# Patient Record
Sex: Female | Born: 1970
Health system: Southern US, Community
[De-identification: ages and names within clinical notes are randomized; demographics above are authoritative.]

## PROBLEM LIST (undated history)

## (undated) ENCOUNTER — Ambulatory Visit: Admission: EM | Payer: PRIVATE HEALTH INSURANCE | Source: Home / Self Care

## (undated) DIAGNOSIS — I2510 Atherosclerotic heart disease of native coronary artery without angina pectoris: Secondary | ICD-10-CM

## (undated) DIAGNOSIS — D219 Benign neoplasm of connective and other soft tissue, unspecified: Secondary | ICD-10-CM

## (undated) DIAGNOSIS — Z8489 Family history of other specified conditions: Secondary | ICD-10-CM

## (undated) DIAGNOSIS — R87619 Unspecified abnormal cytological findings in specimens from cervix uteri: Secondary | ICD-10-CM

## (undated) DIAGNOSIS — O139 Gestational [pregnancy-induced] hypertension without significant proteinuria, unspecified trimester: Secondary | ICD-10-CM

## (undated) DIAGNOSIS — I10 Essential (primary) hypertension: Secondary | ICD-10-CM

## (undated) DIAGNOSIS — IMO0002 Reserved for concepts with insufficient information to code with codable children: Secondary | ICD-10-CM

## (undated) HISTORY — DX: Atherosclerotic heart disease of native coronary artery without angina pectoris: I25.10

## (undated) HISTORY — DX: Essential (primary) hypertension: I10

## (undated) HISTORY — PX: COLPOSCOPY: SHX161

## (undated) HISTORY — DX: Gestational (pregnancy-induced) hypertension without significant proteinuria, unspecified trimester: O13.9

## (undated) HISTORY — PX: BACK SURGERY: SHX140

---

## 1993-12-23 HISTORY — PX: CHOLECYSTECTOMY: SHX55

## 1996-12-23 HISTORY — PX: GYNECOLOGIC CRYOSURGERY: SHX857

## 2000-02-26 ENCOUNTER — Other Ambulatory Visit: Admission: RE | Admit: 2000-02-26 | Discharge: 2000-02-26 | Payer: Self-pay | Admitting: *Deleted

## 2000-03-24 ENCOUNTER — Encounter: Payer: Self-pay | Admitting: Emergency Medicine

## 2000-03-24 ENCOUNTER — Emergency Department (HOSPITAL_COMMUNITY): Admission: EM | Admit: 2000-03-24 | Discharge: 2000-03-24 | Payer: Self-pay | Admitting: *Deleted

## 2000-09-09 ENCOUNTER — Encounter: Payer: Self-pay | Admitting: Emergency Medicine

## 2000-09-09 ENCOUNTER — Emergency Department (HOSPITAL_COMMUNITY): Admission: EM | Admit: 2000-09-09 | Discharge: 2000-09-09 | Payer: Self-pay | Admitting: Emergency Medicine

## 2001-06-15 ENCOUNTER — Emergency Department (HOSPITAL_COMMUNITY): Admission: EM | Admit: 2001-06-15 | Discharge: 2001-06-15 | Payer: Self-pay | Admitting: Emergency Medicine

## 2001-06-21 ENCOUNTER — Inpatient Hospital Stay (HOSPITAL_COMMUNITY): Admission: AD | Admit: 2001-06-21 | Discharge: 2001-06-21 | Payer: Self-pay | Admitting: Obstetrics and Gynecology

## 2001-06-23 ENCOUNTER — Encounter (INDEPENDENT_AMBULATORY_CARE_PROVIDER_SITE_OTHER): Payer: Self-pay | Admitting: Specialist

## 2001-06-23 ENCOUNTER — Encounter: Payer: Self-pay | Admitting: *Deleted

## 2001-06-23 ENCOUNTER — Inpatient Hospital Stay (HOSPITAL_COMMUNITY): Admission: AD | Admit: 2001-06-23 | Discharge: 2001-06-23 | Payer: Self-pay | Admitting: *Deleted

## 2002-09-10 ENCOUNTER — Other Ambulatory Visit: Admission: RE | Admit: 2002-09-10 | Discharge: 2002-09-10 | Payer: Self-pay | Admitting: Obstetrics and Gynecology

## 2002-11-30 ENCOUNTER — Encounter: Admission: RE | Admit: 2002-11-30 | Discharge: 2003-02-28 | Payer: Self-pay | Admitting: Internal Medicine

## 2002-12-09 ENCOUNTER — Ambulatory Visit (HOSPITAL_COMMUNITY): Admission: RE | Admit: 2002-12-09 | Discharge: 2002-12-09 | Payer: Self-pay | Admitting: Obstetrics and Gynecology

## 2002-12-09 ENCOUNTER — Encounter (INDEPENDENT_AMBULATORY_CARE_PROVIDER_SITE_OTHER): Payer: Self-pay | Admitting: Specialist

## 2003-04-17 ENCOUNTER — Emergency Department (HOSPITAL_COMMUNITY): Admission: EM | Admit: 2003-04-17 | Discharge: 2003-04-18 | Payer: Self-pay | Admitting: Emergency Medicine

## 2003-10-26 ENCOUNTER — Other Ambulatory Visit: Admission: RE | Admit: 2003-10-26 | Discharge: 2003-10-26 | Payer: Self-pay | Admitting: Obstetrics and Gynecology

## 2004-03-22 ENCOUNTER — Emergency Department (HOSPITAL_COMMUNITY): Admission: EM | Admit: 2004-03-22 | Discharge: 2004-03-22 | Payer: Self-pay | Admitting: Emergency Medicine

## 2004-10-26 ENCOUNTER — Ambulatory Visit: Payer: Self-pay | Admitting: Internal Medicine

## 2005-01-09 ENCOUNTER — Ambulatory Visit: Payer: Self-pay | Admitting: Internal Medicine

## 2005-02-12 ENCOUNTER — Other Ambulatory Visit: Admission: RE | Admit: 2005-02-12 | Discharge: 2005-02-12 | Payer: Self-pay | Admitting: Obstetrics and Gynecology

## 2005-03-15 ENCOUNTER — Ambulatory Visit: Payer: Self-pay | Admitting: Internal Medicine

## 2005-03-26 ENCOUNTER — Encounter: Admission: RE | Admit: 2005-03-26 | Discharge: 2005-06-24 | Payer: Self-pay | Admitting: Internal Medicine

## 2005-03-27 ENCOUNTER — Ambulatory Visit: Payer: Self-pay | Admitting: Internal Medicine

## 2005-03-29 ENCOUNTER — Ambulatory Visit (HOSPITAL_COMMUNITY): Admission: RE | Admit: 2005-03-29 | Discharge: 2005-03-29 | Payer: Self-pay | Admitting: Obstetrics & Gynecology

## 2005-04-04 ENCOUNTER — Ambulatory Visit (HOSPITAL_COMMUNITY): Admission: RE | Admit: 2005-04-04 | Discharge: 2005-04-04 | Payer: Self-pay | Admitting: Obstetrics & Gynecology

## 2005-04-05 ENCOUNTER — Encounter (INDEPENDENT_AMBULATORY_CARE_PROVIDER_SITE_OTHER): Payer: Self-pay | Admitting: *Deleted

## 2005-04-05 ENCOUNTER — Ambulatory Visit (HOSPITAL_COMMUNITY): Admission: RE | Admit: 2005-04-05 | Discharge: 2005-04-05 | Payer: Self-pay | Admitting: Obstetrics & Gynecology

## 2005-11-18 ENCOUNTER — Emergency Department (HOSPITAL_COMMUNITY): Admission: EM | Admit: 2005-11-18 | Discharge: 2005-11-18 | Payer: Self-pay | Admitting: Emergency Medicine

## 2006-03-04 ENCOUNTER — Emergency Department (HOSPITAL_COMMUNITY): Admission: EM | Admit: 2006-03-04 | Discharge: 2006-03-04 | Payer: Self-pay | Admitting: Emergency Medicine

## 2006-03-17 ENCOUNTER — Emergency Department (HOSPITAL_COMMUNITY): Admission: EM | Admit: 2006-03-17 | Discharge: 2006-03-17 | Payer: Self-pay | Admitting: Family Medicine

## 2006-04-15 IMAGING — CR DG ANKLE COMPLETE 3+V*R*
3 series · 3 of 3 positions shown · non-contrast
Comparison: None.

CLINICAL DATA: Ankle injury with lateral ankle pain. 
 RIGHT ANKLE - 3 VIEW:

[t ankle joint ap right]
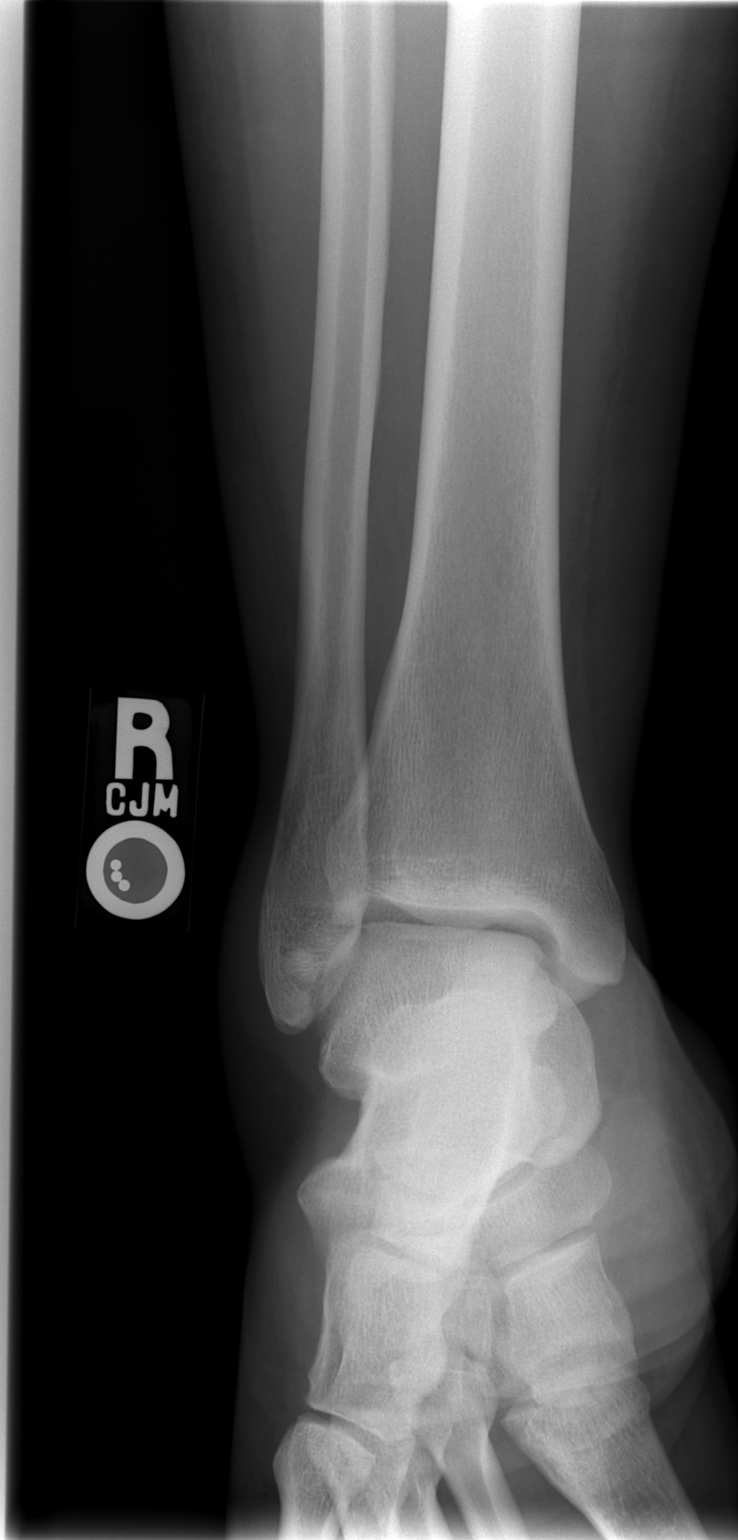

[t ankle joint oblique right]
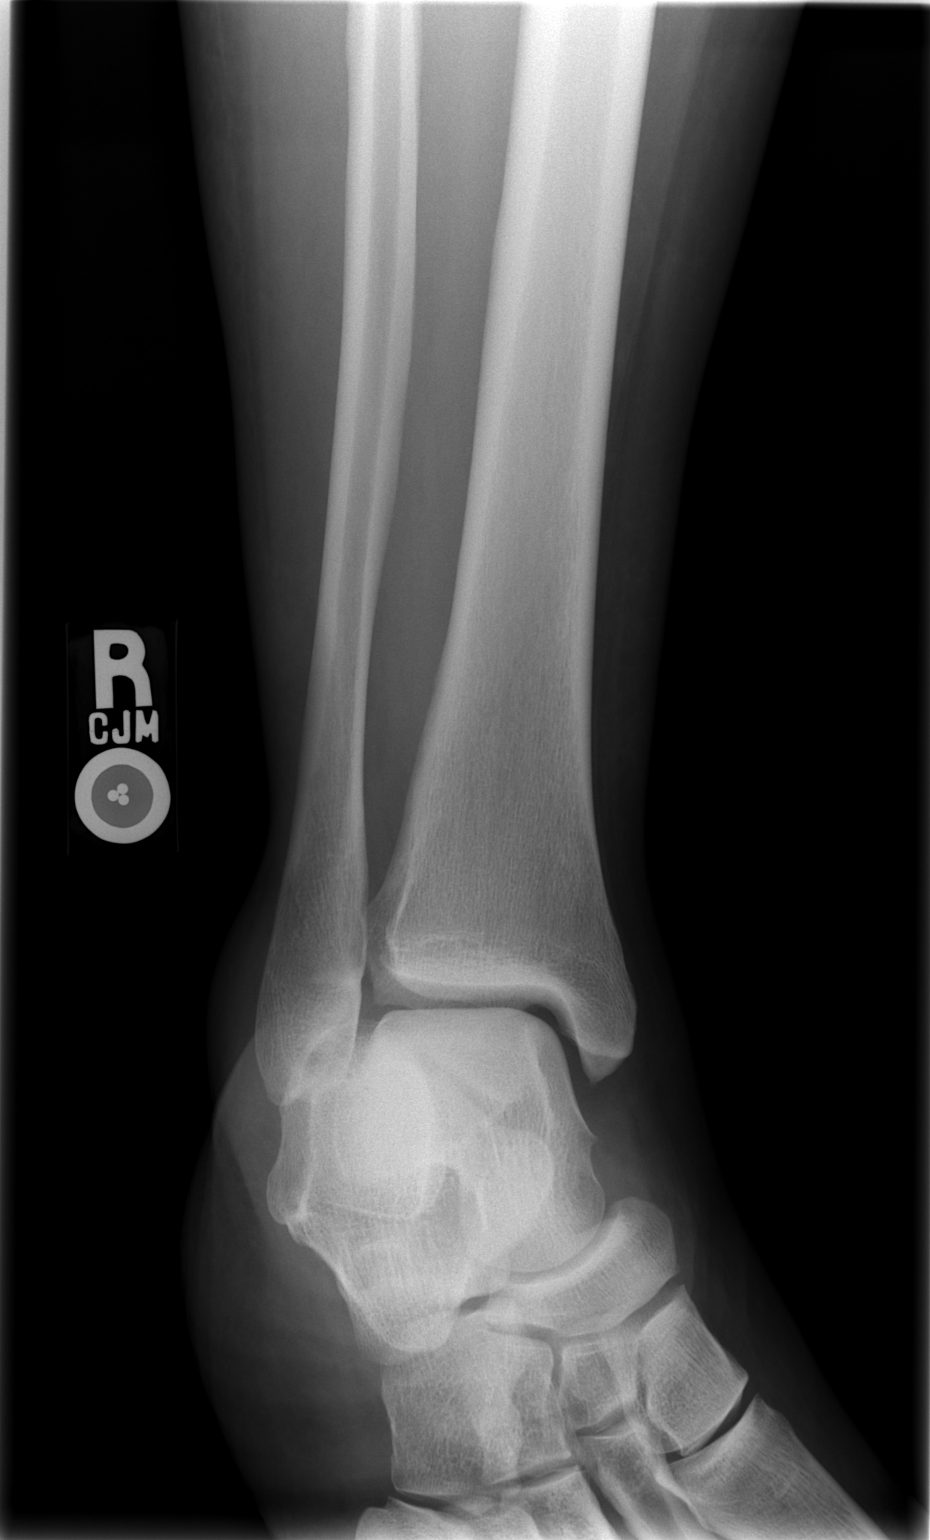

[t ankle joint lat right]
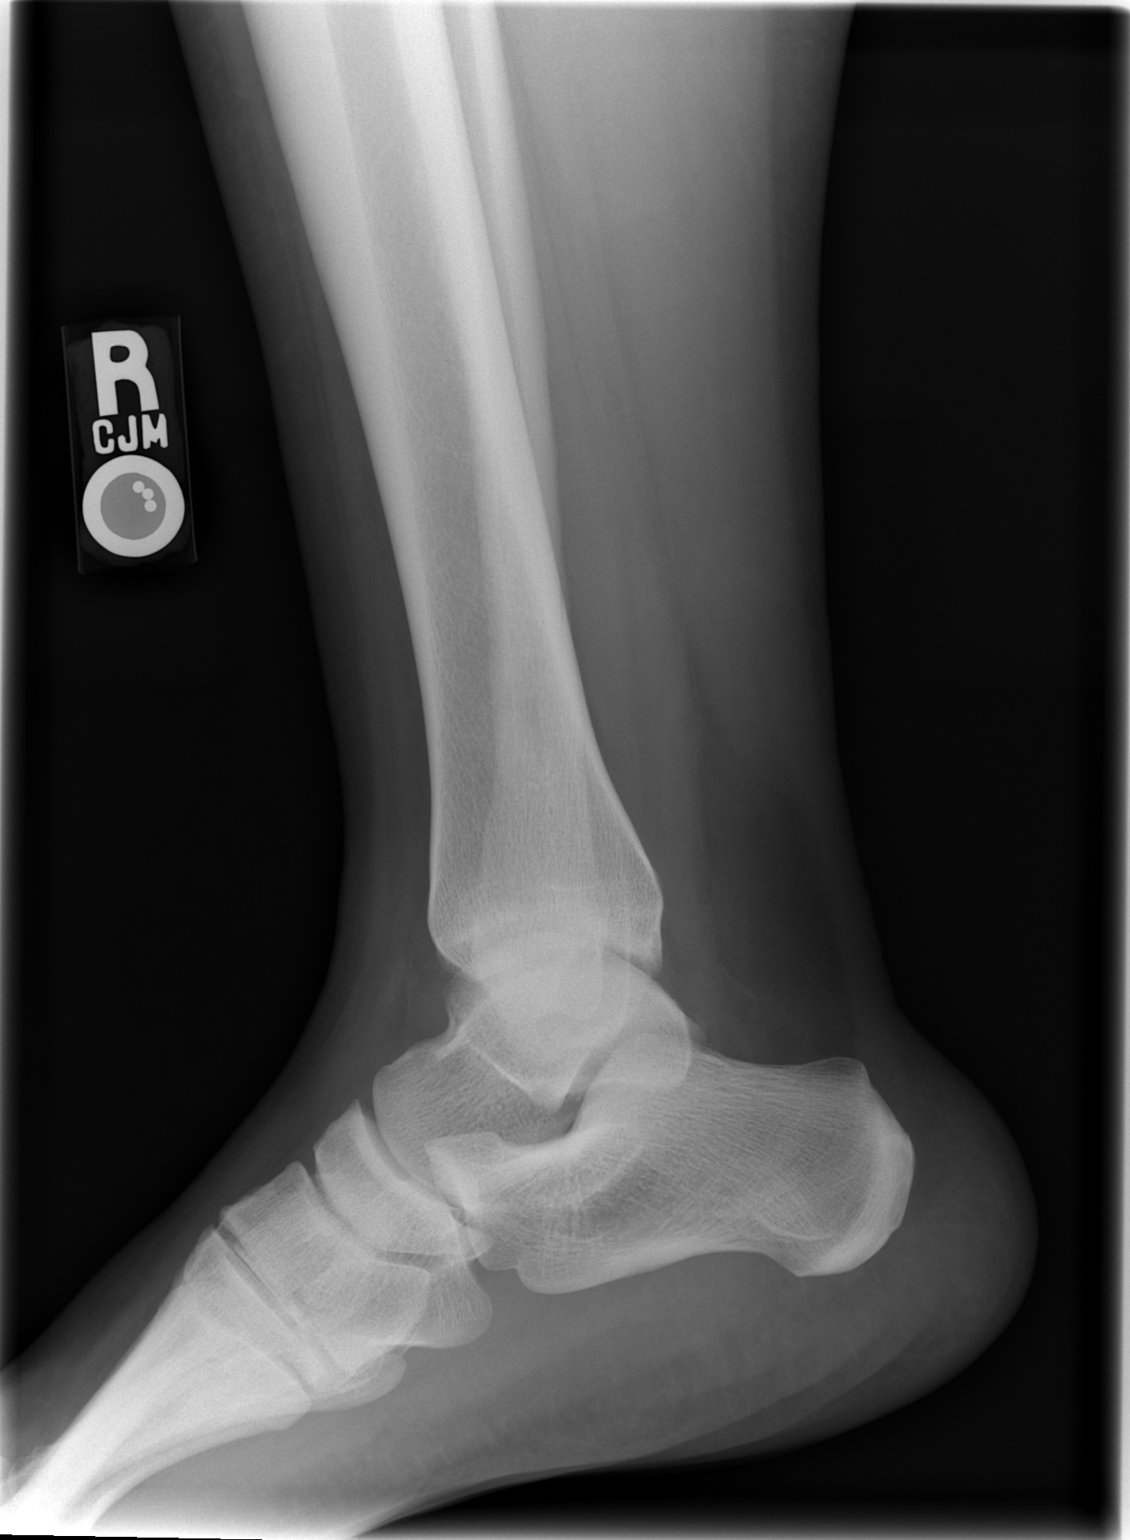

[3 of 3 positions shown; findings below may reference images not displayed]

There is no evidence of fracture, dislocation, or joint effusion.  There is no evidence of arthropathy or other focal bone abnormality.  Soft tissues are unremarkable.
IMPRESSION: Negative.

## 2006-06-09 ENCOUNTER — Other Ambulatory Visit: Admission: RE | Admit: 2006-06-09 | Discharge: 2006-06-09 | Payer: Self-pay | Admitting: Obstetrics and Gynecology

## 2006-09-23 ENCOUNTER — Ambulatory Visit: Payer: Self-pay | Admitting: Endocrinology

## 2006-10-28 ENCOUNTER — Ambulatory Visit (HOSPITAL_COMMUNITY): Admission: RE | Admit: 2006-10-28 | Discharge: 2006-10-28 | Payer: Self-pay | Admitting: Obstetrics and Gynecology

## 2006-11-29 ENCOUNTER — Inpatient Hospital Stay (HOSPITAL_COMMUNITY): Admission: AD | Admit: 2006-11-29 | Discharge: 2006-11-29 | Payer: Self-pay | Admitting: Obstetrics and Gynecology

## 2006-12-14 ENCOUNTER — Inpatient Hospital Stay (HOSPITAL_COMMUNITY): Admission: AD | Admit: 2006-12-14 | Discharge: 2006-12-14 | Payer: Self-pay | Admitting: Obstetrics and Gynecology

## 2007-01-08 ENCOUNTER — Inpatient Hospital Stay (HOSPITAL_COMMUNITY): Admission: AD | Admit: 2007-01-08 | Discharge: 2007-01-08 | Payer: Self-pay | Admitting: Obstetrics and Gynecology

## 2007-01-27 ENCOUNTER — Observation Stay (HOSPITAL_COMMUNITY): Admission: AD | Admit: 2007-01-27 | Discharge: 2007-01-28 | Payer: Self-pay | Admitting: Obstetrics and Gynecology

## 2007-02-06 ENCOUNTER — Inpatient Hospital Stay (HOSPITAL_COMMUNITY): Admission: RE | Admit: 2007-02-06 | Discharge: 2007-02-09 | Payer: Self-pay | Admitting: Obstetrics and Gynecology

## 2007-02-10 ENCOUNTER — Inpatient Hospital Stay (HOSPITAL_COMMUNITY): Admission: AD | Admit: 2007-02-10 | Discharge: 2007-02-13 | Payer: Self-pay | Admitting: Obstetrics and Gynecology

## 2007-04-08 ENCOUNTER — Emergency Department (HOSPITAL_COMMUNITY): Admission: EM | Admit: 2007-04-08 | Discharge: 2007-04-08 | Payer: Self-pay | Admitting: Family Medicine

## 2007-09-05 ENCOUNTER — Emergency Department (HOSPITAL_COMMUNITY): Admission: EM | Admit: 2007-09-05 | Discharge: 2007-09-05 | Payer: Self-pay | Admitting: Emergency Medicine

## 2007-09-25 ENCOUNTER — Encounter: Payer: Self-pay | Admitting: *Deleted

## 2007-09-25 DIAGNOSIS — I10 Essential (primary) hypertension: Secondary | ICD-10-CM | POA: Insufficient documentation

## 2007-09-25 DIAGNOSIS — E1165 Type 2 diabetes mellitus with hyperglycemia: Secondary | ICD-10-CM

## 2007-10-04 ENCOUNTER — Emergency Department (HOSPITAL_COMMUNITY): Admission: EM | Admit: 2007-10-04 | Discharge: 2007-10-04 | Payer: Self-pay | Admitting: Family Medicine

## 2007-11-09 ENCOUNTER — Emergency Department (HOSPITAL_COMMUNITY): Admission: EM | Admit: 2007-11-09 | Discharge: 2007-11-09 | Payer: Self-pay | Admitting: Family Medicine

## 2007-12-22 ENCOUNTER — Telehealth (INDEPENDENT_AMBULATORY_CARE_PROVIDER_SITE_OTHER): Payer: Self-pay | Admitting: *Deleted

## 2008-01-08 ENCOUNTER — Ambulatory Visit (HOSPITAL_COMMUNITY): Admission: RE | Admit: 2008-01-08 | Discharge: 2008-01-08 | Payer: Self-pay | Admitting: Obstetrics and Gynecology

## 2008-01-08 ENCOUNTER — Encounter (INDEPENDENT_AMBULATORY_CARE_PROVIDER_SITE_OTHER): Payer: Self-pay | Admitting: Obstetrics and Gynecology

## 2008-01-08 HISTORY — PX: OTHER SURGICAL HISTORY: SHX169

## 2008-01-08 HISTORY — PX: NOVASURE ABLATION: SHX5394

## 2008-02-29 ENCOUNTER — Inpatient Hospital Stay (HOSPITAL_COMMUNITY): Admission: EM | Admit: 2008-02-29 | Discharge: 2008-03-01 | Payer: Self-pay | Admitting: Emergency Medicine

## 2008-02-29 HISTORY — PX: CARDIAC CATHETERIZATION: SHX172

## 2009-03-17 ENCOUNTER — Emergency Department (HOSPITAL_COMMUNITY): Admission: EM | Admit: 2009-03-17 | Discharge: 2009-03-17 | Payer: Self-pay | Admitting: Emergency Medicine

## 2009-10-18 ENCOUNTER — Emergency Department (HOSPITAL_COMMUNITY): Admission: EM | Admit: 2009-10-18 | Discharge: 2009-10-18 | Payer: Self-pay | Admitting: Emergency Medicine

## 2011-03-28 LAB — POCT CARDIAC MARKERS
CKMB, poc: <1 ng/mL — ABNORMAL LOW (ref 1.0–8.0)
Myoglobin, poc: 73.8 ng/mL (ref 12–200)
Myoglobin, poc: 92.4 ng/mL (ref 12–200)
Troponin i, poc: <0.05 ng/mL (ref 0.00–0.09)
Troponin i, poc: <0.05 ng/mL (ref 0.00–0.09)

## 2011-03-28 LAB — URINALYSIS, ROUTINE W REFLEX MICROSCOPIC
Nitrite: NEGATIVE
Protein, ur: NEGATIVE mg/dL
Urobilinogen, UA: 0.2 mg/dL (ref 0.0–1.0)

## 2011-03-28 LAB — DIFFERENTIAL
Eosinophils Absolute: 0.1 10*3/uL (ref 0.0–0.7)
Eosinophils Relative: 1 % (ref 0–5)
Lymphocytes Relative: 26 % (ref 12–46)
Lymphs Abs: 2.6 10*3/uL (ref 0.7–4.0)
Monocytes Absolute: 0.4 10*3/uL (ref 0.1–1.0)
Monocytes Relative: 4 % (ref 3–12)
Neutro Abs: 6.9 10*3/uL (ref 1.7–7.7)

## 2011-03-28 LAB — POCT I-STAT, CHEM 8
Chloride: 103 mEq/L (ref 96–112)
HCT: 39 % (ref 36.0–46.0)
Potassium: 3.8 mEq/L (ref 3.5–5.1)
Sodium: 139 mEq/L (ref 135–145)
TCO2: 23 mmol/L (ref 0–100)

## 2011-03-28 LAB — URINE MICROSCOPIC-ADD ON

## 2011-03-28 LAB — CBC
MCV: 88.3 fL (ref 78.0–100.0)
WBC: 10 10*3/uL (ref 4.0–10.5)

## 2011-03-28 LAB — BRAIN NATRIURETIC PEPTIDE: Pro B Natriuretic peptide (BNP): <30 pg/mL (ref 0.0–100.0)

## 2011-03-28 LAB — POCT PREGNANCY, URINE: Preg Test, Ur: NEGATIVE

## 2011-03-28 LAB — D-DIMER, QUANTITATIVE: D-Dimer, Quant: 0.36 ug/mL-FEU (ref 0.00–0.48)

## 2011-04-04 LAB — POCT RAPID STREP A (OFFICE): Streptococcus, Group A Screen (Direct): NEGATIVE

## 2011-05-07 NOTE — Cardiovascular Report (Signed)
NAMEMALESSA, ZARTMAN                 ACCOUNT NO.:  [id number]   MEDICAL RECORD NO.:  [id number]          PATIENT TYPE:  INP   LOCATION:  2039                         FACILITY:  [facility]   PHYSICIAN:  Cristy Hilts. Jacinto Halim, MD       DATE OF BIRTH:  December 31, 1970   DATE OF PROCEDURE:  02/29/2008  DATE OF DISCHARGE:                            CARDIAC CATHETERIZATION   PROCEDURE PERFORMED:  1. Left ventriculography.  2. Selective right and left coronary arteriography.  3. Ascending aortogram.  4. Abdominal aortogram.   INDICATIONS:  Ms. Jeda Pardue is a 40 year old female with longstanding  history of diabetes which is uncontrolled, hypertension and obesity who  is one year postpartum and had been doing well until yesterday and today  has been having recurrent chest discomfort.  Because of her significant  cardiovascular risk factors that includes significant family history of  premature coronary artery disease, she was brought to the cardiac  catheterization lab for definite delineation of her coronary anatomy.  Ascending aortogram was performed to evaluate for ascending aortic  aneurysm and aortic regurgitation and abdominal aortogram for abdominal  atherosclerosis.   HEMODYNAMIC DATA:  The left ventricular pressure was 154/1 with end  diastolic pressure of 18 mm Hg.  Aortic pressure was 143/85 with a mean  of 110 mm Hg.  There was no pressure gradient across the aortic valve.   ANGIOGRAPHIC DATA:  1. Left ventricle:  The left ventricular systolic function was normal      with ejection fraction of 60%.  There was no significant mitral      regurgitation.  2. Right coronary artery:  Right coronary artery is a large-caliber      vessel and a dominant vessel.  It gives origin to large PDA and      PLA.  It is smooth and normal.  3. Left main coronary artery:  Left main coronary artery is a large-      caliber vessel that is smooth and normal.  4. Circumflex artery:  Circumflex artery is a  large-caliber vessel.      It is smooth and normal.  5. LAD:  The LAD is a large-caliber vessel.  It is mildly tortuous and      gives origin to several small diagonals.  It is smooth and normal.   ASCENDING AORTOGRAM:  Ascending aortogram revealed presence of three  aortic valve cusps.  There was no evidence of ascending aortic aneurysm  or aortic regurgitation.   ABDOMINAL AORTOGRAM:  Abdominal aortogram revealed presence of two renal  arteries, one on either side.  They are widely patent.   IMPRESSIONS:  1. Normal coronary arteries.  2. Normal LV (left ventricular) systolic function.  3. Mild to moderate elevation in left ventricular end-diastolic      pressure secondary to hypertension, diabetes and obesity.   RECOMMENDATIONS:  Aggressive risk modification is indicated.  Evaluation  for noncardiac causes of chest pain is indicated.  Risk modification is  indicated.  The patient will need her lipids checked given her  significant family history of premature coronary disease and  diabetes,  would have a very low threshold for starting her on a statin therapy.  The patient will follow up with Dr. Alwyn Pea.   TECHNIQUE OF PROCEDURE:  Under the usual sterile precautions using a 6-  French right femoral arterial access, a 6-French multipurpose B2  catheter was advanced through the ascending aorta and then into the left  ventricle over a J-wire and left ventriculography was performed both in  the LAO and RAO projection.  Catheter was then pulled into the ascending  aorta.  The right coronary was selectively engaged and angiography was  performed.  Then the left main coronary artery was selectively engaged  and angiography was performed.  Then an ascending aortogram was  performed in the LAO projection.  Catheter was then pulled back into the  abdominal aorta and the abdominal aortogram was performed.  The catheter  was then pulled out of the body.   Right femoral arteriography  was performed through the arterial access  sheath and the access was closed with StarClose with excellent  hemostasis.  The patient tolerated the procedure.  A total of 105 mL of  contrast was utilized for diagnostic angiography.      Cristy Hilts. Jacinto Halim, MD  Electronically Signed     JRG/MEDQ  D:  02/29/2008  T:  03/01/2008  Job:  [id number]   cc:   Tanya D. Daphine Deutscher, M.D.

## 2011-05-07 NOTE — Op Note (Signed)
NAMEREECE, MCBROOM                 ACCOUNT NO.:  [id number]   MEDICAL RECORD NO.:  [id number]          PATIENT TYPE:  AMB   LOCATION:  SDC                           FACILITY:  [facility]   PHYSICIAN:  Osborn Coho, M.D.   DATE OF BIRTH:  [birth year]   DATE OF PROCEDURE:  01/08/2008  DATE OF DISCHARGE:                               OPERATIVE REPORT   PREOPERATIVE DIAGNOSES:  1. Menorrhagia.  2. Fibroids.   POSTOPERATIVE DIAGNOSES:  1. Menorrhagia.  2. Fibroids.   PROCEDURE:  1. Hysteroscopy.  2. Dilation and curettage.  3. Removal of intrauterine device.  4. NovaSure ablation.  5. Rollerball ablation.   SURGEON:  Osborn Coho, M.D.   ANESTHESIA:  General via laryngeal mask anesthesia.   SPECIMENS:  Endometrial curettings to pathology.   IV FLUIDS:  2000 mL.   URINE OUTPUT:  Quantity sufficient via straight cath prior to procedure.   FLUID DEFICIT:  Hysteroscopic fluid deficit of sorbitol 375 ML and  lactated Ringer's 125 mL.  This was an approximation as there was  approximately 500 mL of fluid on the floor.   ESTIMATED BLOOD LOSS:  Minimal.   COMPLICATIONS:  None.   PROCEDURE:  The patient was taken to the operating room after risks,  benefits and alternatives reviewed with the patient.  The patient  verbalized understanding.  Consent signed and witnessed.  The patient  was placed under general anesthesia and prepped and draped in a normal  sterile fashion.  A bivalve speculum was placed in the patient's vagina  and the anterior lip of the cervix grasped with single-tooth tenaculum.  The Mirena IUD was removed without difficulty. The uterus was then  sounded to approximately 10 cm and cervical length measured to  approximately 4.5 cm which was also measured with the hysteroscope.  Cavity length was approximately 5.5 cm.  Hysteroscope was introduced and  no obvious intracavitary lesions noted.  Curettage was performed and a  gritty texture noted and curettings sent to  pathology.  The cervix was  already dilated for passage of the NovaSure instrument with adequacy.  The NovaSure instrument was introduced and initially went to  approximately 4.8 cm for cavity width.  However, this was unable to be  reproduced and the cavity width ended up being 4.3 despite several  manipulations to seat the NovaSure properly.  Ablation was performed and  cavity assessment was successful.  Ablation was performed at a power of  130 watts for a time of 50 seconds.  Hysteroscope was reintroduced and  ablation seemed adequate in the right portion of the uterus.  However,  in the left cornual region the ablation did not seem to take effect.  Rollerball ablation was then performed to try to achieve adequate  ablation on that left side as well.  The fluids were exchanged  appropriately for the different procedures using sorbitol for the  rollerball and lactated Ringer's for the NovaSure.  The cavity was  adequately flushed upon changing fluids.  All instruments were removed.  There was good hemostasis at tenaculum site.  Count was correct.  The  patient tolerated the procedure well and was returned to recovery room  in good condition.      Osborn Coho, M.D.  Electronically Signed     AR/MEDQ  D:  01/08/2008  T:  01/08/2008  Job:  [id number]

## 2011-05-10 NOTE — Discharge Summary (Signed)
Erica Nguyen, Erica Nguyen                 ACCOUNT NO.:  [id number]   MEDICAL RECORD NO.:  [id number]          PATIENT TYPE:  INP   LOCATION:  9157                          FACILITY:  [facility]   PHYSICIAN:  Hal Morales, M.D.DATE OF BIRTH:  05-04-1971   DATE OF ADMISSION:  02/10/2007  DATE OF DISCHARGE:  02/13/2007                               DISCHARGE SUMMARY   ADMISSION DIAGNOSES:  1. Postpartum day #3.  2. Possible pre-eclampsia.  3. Dyspnea.   DISCHARGE DIAGNOSES:  1. Postpartum day #3.  2. Possible pre-eclampsia.  3. Dyspnea resolved  4. Questionable increased liver function tests from passive congestion      of liver.   HOSPITAL PROCEDURES:  1. Chest x-ray.  2. Magnesium sulfate infusion.   HOSPITAL COURSE:  The patient was admitted with dyspnea and headache and  increased edema.  Upon admission, she had 2+ edema.  DTRs were 3+.  Her  AST was elevated to 63, and her ALT was elevated to 34.  Repeat uric  acid 6.7.  Chest x-ray showed patchy airspace disease throughout right  lung and left lower lobe, representing either pneumonia or edema.  She  was admitted.  Pulse ox was 97%.  On hospital day #2, she was doing better.  She was on magnesium sulfate.  Shortness of breath was improved.  A 24-hour urine was done and she was  given routine care.  On hospital day #3, she continued to improve.  She had a mild headache.  Blood pressures range 143-145 over 81-84.  Fasting blood sugars 130.  Two-hour PCs were 81-211.  A 24-hour urine showed 1,302 mg of protein in  24 hours.  She was increased to labetalol 300 mg twice a day.  She was  given Lasix for diuresis.  She was seen by the diabetes coordinator for  management of diabetes.  On hospital day #4, she was doing better.  She had no cough and no  headache.  Blood pressures 138-142 over 74-77.  Weight was down from 268-  66.  Chemistries were back within normal range.  Heart rate was regular  rate rhythm.  Chest was clear.  Lochia  was scant.  Extremities showed 1+  edema.  Decision was made that she had received full benefit of her  hospital stay and she was discharged home.   DISCHARGE MEDICATIONS:  1. Labetalol 300 mg twice a day.  2. Hydrochlorothiazide 25 mg once a day.  3. K-Dur 10 mEq once a day.  4. Humalog insulin per schedule with sliding scale.  5. Prenatal vitamins once per day.   DISCHARGE LABORATORY:  Total protein 1,302 has.  Her sodium 136,  potassium 3.9, creatinine 0.55, AST 35, ALT 36, magnesium 5.1.   DISCHARGE INSTRUCTIONS:  1. No added salt diet.  2. Increase activity slowly.  3. No sexual activity for 6 weeks.   DISCHARGE FOLLOWUP:  To occur on February 18, 2007, at 1:30 p.m. with  Dr. Estanislado Pandy.   CONDITION AT DISCHARGE:  Good.      Marie L. Williams, C.N.M.      [name]  Durenda Hurt, M.D.  Electronically Signed    MLW/MEDQ  D:  02/13/2007  T:  02/13/2007  Job:  [id number]

## 2011-05-10 NOTE — Op Note (Signed)
Erica Nguyen, CASTLEMAN                 ACCOUNT NO.:  [id number]   MEDICAL RECORD NO.:  [id number]          PATIENT TYPE:  INP   LOCATION:  9155                          FACILITY:  [facility]   PHYSICIAN:  Janine Limbo, M.D.DATE OF BIRTH:  1971/07/20   DATE OF PROCEDURE:  01/28/2007  DATE OF DISCHARGE:                               OPERATIVE REPORT   PREOPERATIVE DIAGNOSES:  1. 35-5/[redacted] weeks gestation.  2. Hypertension.  3. Diabetes mellitus type 2.  4. Polyhydramnios.  5. Dyspnea.   POSTOPERATIVE DIAGNOSES:  1. 35-5/[redacted] weeks gestation.  2. Hypertension.  3. Diabetes mellitus type 2.  4. Polyhydramnios.  5. Dyspnea.   PROCEDURE:  Amniocentesis with drainage of amniotic fluid.   SURGEON:  Dr. Lafayette Dragon stringer.   ANESTHETIC:  Local with Xylocaine.   DISPOSITION:  [name] is a 40 year old female, gravida 3, para 0-0-2-0,  who presents with the above-mentioned diagnosis.  The patient has been  followed at the [facility]  [facility].  She is currently being treated with  labetalol for her blood pressure and insulin for blood sugar management.  Her sugars have been well controlled.  The patient presented on January 27, 2007 with dyspnea.  The patient understands the indications for her  amniocentesis with drainage of amniotic fluid.  She accepts the risks of  bleeding, ruptured membranes, and possible fetal stress.   FINDINGS:  The patient's blood type is A+.  An ultrasound was performed  on the antenatal unit.  She was noted to have a single intrauterine  gestation with a cephalic presentation.  The placenta was on the right  side of the uterus and was fundal.  It was a grade 2 placenta.  The  patient was noted to have polyhydramnios.  An appropriate fluid pocket  was isolated.   PROCEDURE:  The patient was placed in a lithotomy position.  An  ultrasound was performed and an appropriate fluid pocket was  isolated.  The patient's abdomen was prepped with multiple layers of Betadine and  then was sterilely draped.  2 mL of 1% Xylocaine were then injected into  the skin.  The spinal needle was directed into the amniotic cavity  without difficulty.  Fluid was collected for fetal lung maturity studies  which will be sent to the [facility] and then an LS ratio that will  be sent to [facility].  A total of 300 mL were  then removed from the amniotic cavity.  The patient reports that she  felt better at the end of the procedure.  The needle was removed and the  abdomen was cleaned and a Band-Aid was placed.  The patient had a  reactive nonstress  test prior to the procedure.  She was placed back on the monitor and  fetal heart tones were noted to be stable after the procedure.  We  checked the patient's cervix and the cervix was closed and long and  there was no evidence of leakage of amniotic fluid.  Janine Limbo, M.D.  Electronically Signed     AVS/MEDQ  D:  01/28/2007  T:  01/28/2007  Job:  [id number]

## 2011-05-10 NOTE — Consult Note (Signed)
[facility]                            ENDOCRINOLOGY CONSULTATION   NAME:Erica Nguyen, LEAFY MOTSINGER                        MRN:          [id number]  DATE:09/23/2006                            DOB:          09-Sep-1971    REFERRING PHYSICIAN:  [name], M.D.   REASON FOR REFERRAL:  Diabetes.   HISTORY OF PRESENT ILLNESS:  A 40 year old woman with an 8-year history of  type 2 diabetes.  She is unaware of any chronic complications.  She has been  on insulin for 6 years due to a failure of oral agents.  She is now [redacted] weeks  pregnant.  She denies any symptoms.  She currently takes Humalog 75/25  insulin, 20 units every morning and 30 units before stopping.  She states  her glucoses are controlled in general, but they tend to be lowest in the  morning and higher as the day goes on.   PAST MEDICAL HISTORY:  Hypertension.   MEDICATIONS:  1. Normodyne 100 mg twice a day.  2. Prenatal l vitamins 1 daily.   SOCIAL HISTORY:  She is engaged to be married.  She works in [employer].   FAMILY HISTORY:  Positive for diabetes in both parents.   REVIEW OF SYSTEMS:  She has gained 2 pounds during this pregnancy.  She had  hypoglycemia once a few days ago, when her glucose was 44 in the middle of  the night.  She denies any nausea, vomiting, or shortness of breath.   PHYSICAL EXAMINATION:  VITAL SIGNS:  Blood pressure 125/81, heart rate 90,  temperature is 98.6, the weight is 254.  GENERAL:  No distress.  She is obese.  SKIN: Not diaphoretic.  I do not see a rash.  HEENT:  No proptosis,  No periorbital swelling.  NECK:  No goiter.  CHEST:  Clear to auscultation, no respiratory distress.  CARDIOVASCULAR:  There are no JVD or edema.  Regular rate and rhythm.  No  murmur.  Pedal pulses are intact.  There is no bruit at the carotid  arteries.  FEET:  Normal color and temperature. There is no ulcer present on the feet.  NEUROLOGIC:  Alert and oriented.  Does  not appear anxious or depressed.  Cranial nerves appear to be intact bilaterally, and sensation is intact to  touch in the feet.   LABORATORY STUDIES:  I reviewed 30 of the patient's home glucose readings  with her.   IMPRESSION:  Improved control of type 2 diabetes, but the pattern of her  home glucoses suggests she needs more insulin during the day and less at  night.   PLAN:  1. Change insulin to Humalog 20 with breakfast, 5 with lunch, and 25 units      with supper.  2. Return in about 6 days, sooner if necessary.            ______________________________  Cleophas Dunker Everardo All, MD     SAE/MedQ  DD:  09/25/2006  DT:  09/26/2006  Job #:  [id number]   cc:   [name], M.D.

## 2011-05-10 NOTE — Op Note (Signed)
   Erica Nguyen, Erica Nguyen                             ACCOUNT NO.:  [id number]   MEDICAL RECORD NO.:  [id number]                   PATIENT TYPE:  AMB   LOCATION:  DAY                                  FACILITY:  [facility]   PHYSICIAN:  Zenaida Niece, M.D.             DATE OF BIRTH:  Jul 01, 1971   DATE OF PROCEDURE:  12/09/2002  DATE OF DISCHARGE:                                 OPERATIVE REPORT   PREOPERATIVE DIAGNOSES:  Missed abortion.   POSTOPERATIVE DIAGNOSES:  Missed abortion.   PROCEDURE:  Suction, dilation and curettage.   SURGEON:  Zenaida Niece, M.D.   ANESTHESIA:  Monitored anesthesia care.   ESTIMATED BLOOD LOSS:  100 cc.   FINDINGS:  An eight week size anteverted uterus with a closed cervical os  and a moderate amount of products of conception was obtained.   DESCRIPTION OF PROCEDURE:  The patient was taken to the operating room and  placed in the dorsal supine position. She was given IV sedation and placed  in mobile stirrups. The perineum and vagina were then prepped and draped in  the usual sterile fashion and a bivalve speculum was inserted into the  vagina. The anterior lip of the cervix was grasped with a single tooth  tenaculum. The uterus then sounded to approximately 9 cm. The cervix was  gradually dilated to a size 25 dilator which allowed easy passage of a size  8 curved curette. Suction curettage was then performed with return of a  moderate amount of products of conception. Sharp curettage revealed good  uterine cry in all quadrants and no further significant tissue. Suction  curettage was performed one more time with minimal blood and minimal tissue.  The single tooth tenaculum was removed from the cervix and bleeding  controlled with pressure. All instruments were then removed from the vagina.  The patient was awakened in the operating room and tolerated the procedure  well and was taken to the recovery room in stable condition.                              Zenaida Niece, M.D.    TDM/MEDQ  D:  12/09/2002  T:  12/09/2002  Job:  [id number]

## 2011-05-10 NOTE — H&P (Signed)
Erica Nguyen, Erica Nguyen                 ACCOUNT NO.:  [id number]   MEDICAL RECORD NO.:  [id number]          PATIENT TYPE:  INP   LOCATION:  9155                          [facility]ILITY:  [facility]   PHYSICIAN:  Osborn Coho, M.D.   DATE OF BIRTH:  [birth year]   DATE OF ADMISSION:  01/27/2007  DATE OF DISCHARGE:                              HISTORY & PHYSICAL   OBSERVATION STATUS:   HISTORY OF PRESENT ILLNESS:  Erica Nguyen is a 40 year old single black  female, gravida 3, para 0-0-2-0, at 35-5/7 weeks, who presents with  onset of dyspnea this evening.  She feels she has to concentrate to  breathe.  She denies bleeding or leaking.  She has also felt decreased  fetal movement.  She denies headache, visual disturbances or nausea and  vomiting.  Her pregnancy has been followed by the [facility]  OB/GYN MD service and has been remarkable for:  (1) Chronic  hypertension, on labetalol, (2) type 2 diabetes mellitus, on Humalog,  (3) first trimester spotting, (4) increased body mass index, (5) two  SABs, (6) group B strep positive.  The patient last ate at 8:30 p.m.  She was diagnosed with polyhydramnios last week.   PRENATAL LABORATORY DATA:  Her prenatal labs were collected on August 01, 2006:  Hemoglobin 11.5, hematocrit 34.6, platelets 311,000.  Blood  type A positive, antibody negative.  Sickle cell trait negative.  RPR  nonreactive.  Rubella nonimmune.  Hepatitis B surface antigen negative.  Pap smear within normal limits.  Gonorrhea negative.  Chlamydia  negative.  Comprehensive metabolic panel was within normal limits.  Hemoglobin A1c from November 20, 2006 was 5.7.  Fetal fibronectin was  negative on December 01, 2006.  On December 01, 2006, group B strep was  positive.   HISTORY OF PRESENT PREGNANCY:  Prenatal care was started at [facility] on August 01, 2006 at 10 weeks' gestation.  The patient was  already taking labetalol and Humalog; the patient was started on  labetalol b.i.d.  at 13 weeks' gestation.  Her baseline 24-hour urine and  PIH labs were normal on September 02, 2006.  Anatomy ultrasound at 19  weeks' gestation shows growth consistent with previous dating,  confirming EDC of February 27, 2007.  Cardiac anatomy was normal at 22  weeks' gestation.  Blood sugars generally remained within the desirable  range.  Fetal echo was normal in the 2nd trimester.  Hemoglobin A1c at  26 weeks' gestation abdomen was 5.7.  She was evaluated for cramping and  pelvic pressure at 28 weeks.  Fetal fibronectin was negative.  Group B  strep was positive.  Ultrasound at 28 weeks shows estimated fetal weight  at the 82nd percentile with normal fluid.  Antenatal testing was started  b.i.d. at 32 weeks' gestation.  Some adjustments have been made in her  Humalog during the pregnancy.  Essentially, her blood sugars have  remained well-controlled and her blood pressure has remained stable with  labetalol.   OBSTETRICAL HISTORY:  She is a gravida 3, para 0-0-2-0.  In July 2002,  at 6  weeks' gestation, she had an SAB.  In April 2006, at 7 weeks'  gestation, she had an SAB with a D&C.   ALLERGIES:  She has no medication allergies.   MEDICAL HISTORY:  She experienced menarche at the age of 51 with monthly  cycles lasting 6-7 days.  She has used birth control pills and NuvaRing  for contraception in the past.  She has had colposcopy.  She also has a  history of fibroid.  She had trichomonas in high school.  She reports  having had the usual childhood illnesses.  The patient has been  diagnosed with hypertension in the past.  She has a history of anemia.  The patient was diagnosed about 8 years ago with diabetes.  She has an  occasional urinary tract infection.   SURGICAL HISTORY:  Remarkable for wisdom teeth extraction,  cholecystectomy and then her D&C.   FAMILY MEDICAL HISTORY:  Mother with history of MI, patient's father  with chronic hypertension, maternal grandmother with  varicosities,  multiple family members with diabetes.  Father was on dialysis.  Father  with CVA.  Maternal grandmother with Alzheimer's.  Maternal grandmother  with history of ovarian cancer.   GENETIC HISTORY:  Negative.   SOCIAL HISTORY:  The patient is single.  The father of the baby's name  is Erica Nguyen; he is involved and supportive.  They are of the [facility] faith.  The patient has 13 years of education and is employed full-time in  Clinical biochemist.  Father of the baby has 15-1/2 years of education and  is employed full-time by [employer].  They deny any alcohol, tobacco or  illicit drug use with the pregnancy.   OBJECTIVE DATA:  VITAL SIGNS:  Blood pressure is 136/73; other vital  signs are stable.  Oxygen saturation is between 96% and 98% on room air.  HEENT:  Grossly within normal limits.  CHEST:  Clear to auscultation.  HEART:  Regular rate and rhythm.  ABDOMEN:  Gravid in contour with fundal height extending approximately  40 cm above the pubic symphysis.  Fetal heart rate is reactive and  reassuring.  There are no contractions.  PELVIC:  Exam is deferred.  EXTREMITIES:  Remarkable for 1+ edema and normal reflexes.   ADMISSION LABORATORY DATA:  Clean-catch urinalysis and catheterized  urinalysis both show 100 of protein and 15 of ketones.  CBG is 101.  Hemoglobin 10.5, hematocrit 30.3, white blood cell count 11.2, platelets  231,000.  SGOT 21, SGPT 13, uric acid 5.0 and LDH 172.   ASSESSMENT:  1. Intrauterine pregnancy at 35-5/7 weeks.  2. Chronic hypertension.  3. Type 2 diabetes.  4. Posterior hyaloid.  5. Dyspnea.   PLAN:  1. Admit under observation.  2. Collect 24-hour urine.  3. Perform chest x-ray.  4. Urine to culture.  5. MD to follow.      Erica Nguyen, C.N.M.      Osborn Coho, M.D.  Electronically Signed    KS/MEDQ  D:  01/28/2007  T:  01/28/2007  Job:  [id number]

## 2011-05-10 NOTE — H&P (Signed)
NAMERANADA, VIGORITO                 ACCOUNT NO.:  [id number]   MEDICAL RECORD NO.:  [id number]          PATIENT TYPE:  MAT   LOCATION:  MATC                          [facility]  [facility]   PHYSICIAN:  Osborn Coho, M.D.   DATE OF BIRTH:  May 01, 1971   DATE OF ADMISSION:  02/10/2007  DATE OF DISCHARGE:  02/11/2007                              HISTORY & PHYSICAL   This is a 40 year old gravida 3, para 1-0-2-1 who is 3 days post vaginal  delivery who presents with complaints of dyspnea for 24 hours.  She also  complains of headache and increased edema.  Her pregnancy was remarkable  for 1) abnormal Pap; 2) childhood Varicella; 3) history of anemia; 4)  chronic hypertension; 5) type 2 diabetes, insulin-dependent; 6)  polyhydramnios; 7) increased BMI.   ALLERGIES:  None.   OBSTETRICAL HISTORY:  Remarkable for spontaneous abortion in 2002 and  2006 and a vaginal delivery on February 06, 2007.   MEDICAL HISTORY:  1. Remarkable for abnormal Pap in the past.  2. Childhood Varicella.  3. History of anemia.  4. History of chronic hypertension.  5. Type 2 diabetes which is insulin-dependent.   FAMILY HISTORY:  Remarkable for a mother with a heart disease, father  with hypertension, grandmother with varicosities.  Mother, father and  grandparents with diabetes.  Father with kidney failure.  Father and  grandmother with stroke and grandmother with ovarian cancer.   SURGICAL HISTORY:  Remarkable for wisdom teeth and gallbladder  surgeries.   GENETIC HISTORY:  Remarkable for father of the baby's age of 61.   PRENATAL LABS:  Hemoglobin 11.5, platelets 311.  Blood type A positive.  Antibody screen negative.  Sickle cell negative.  RPR nonreactive.  Rubella nonimmuned.  Hepatitis negative.  HIV declined.  Pap test  normal.  Gonorrhea negative, Chlamydia negative.   HISTORY OF CURRENT PREGNANCY:  The patient entered care at [redacted] weeks  gestation.  She was placed on labetalol for hypertension and  insulin was  continued for her diabetes.  She had a baseline 24-hour urine done which  was normal.  She had a quad screen at 17 weeks that was normal.  Ultrasound at 18 weeks was normal.  She had some pressure at 27 weeks  and negative workup for preterm labor.  Ultrasound at 27 weeks was  normal.  Blood sugars remained in good control throughout the pregnancy.  A 32-week and 35-week ultrasounds were done.  A 35-week ultrasound  showed polyhydramnios.  Group B strep was done and was positive at term.  She was electively induced for labor and delivered vaginally with  shoulder dystocia and intact perineum.  She had a postpartum course that  was uncomplicated.  Her fasting blood sugars were 89 and 112.  Her 2  hour PC blood sugars were in the 140s.  She was kept on her insulin  schedule and discharged home on postpartum day #2 with follow-up  scheduled with Dr. Allyne Gee in 2 weeks.  She was also placed on sliding  scale insulin for blood sugars over 200.   OBJECTIVE DATA:  VITAL SIGNS:  Stable.  Blood pressure is 148/86, 160/75  and 163/82.  HEENT:  Within normal limits.  Thyroid normal and not enlarged.  CHEST:  Clear to auscultation with decreased sounds in the right base.  HEART:  Regular rate and rhythm.  DTRs 3+ with no clonus.  There is 2+  edema.  ABDOMEN:  Soft and nontender.   CBC:  White blood cell count 12.9, platelets are 230.  Chemistries  normal except for an AST of 63 and an ALT of 34.  Uric acid was 6.7.  Chest x-ray was done which showed low lung volumes and new patchy  airspace disease throughout right lung and left lower lobe.  Heart was  upper limits of normal and there were no effusions.  Pulse oximeter  showed 97% saturation.   ASSESSMENT:  1. Three days postpartum.  2. Possible preeclampsia.  3. Dyspnea with abnormal chest x-ray.   PLAN:  1. Admit to hospital per Dr. Su Hilt.  2. Further aggressive following.      Marie L. Williams, C.N.M.       Osborn Coho, M.D.  Electronically Signed    MLW/MEDQ  D:  02/11/2007  T:  02/11/2007  Job:  [id number]

## 2011-05-10 NOTE — Op Note (Signed)
NAMEMARVELYN, Erica Nguyen                 ACCOUNT NO.:  [id number]   MEDICAL RECORD NO.:  [id number]          PATIENT TYPE:  AMB   LOCATION:  SDC                           [facility]   PHYSICIAN:  Charles A. Clearance Coots, M.D.DATE OF BIRTH:  [birth year]   DATE OF PROCEDURE:  04/05/2005  DATE OF DISCHARGE:                                 OPERATIVE REPORT   PREOPERATIVE DIAGNOSIS:  A 6-week intrauterine fetal demise.   POSTOPERATIVE DIAGNOSIS:  A 6-week intrauterine fetal demise.   PROCEDURE:  Suction, dilation, and evacuation.   SURGEON:  Charles A. Clearance Coots, M.D.   ANESTHESIA:  MAC with paracervical block.   ESTIMATED BLOOD LOSS:  100 mL.   COMPLICATIONS:  None   SPECIMENS:  Products of conception.   OPERATION:  The patient was brought to the operating room.  After  satisfactory IV sedation, legs were brought up in stirrups and the vagina  was prepped and draped in the usual sterile fashion.  The urinary bladder  was emptied of approximately 100 mL of clear urine. Bimanual examination  revealed the uterus to be in mid position.  Sterile speculum was inserted in  the vaginal vault and the cervix was isolated. The anterior lip of the  cervix was grasped with a single-tooth tenaculum. Paracervical block of 1%  Xylocaine with 2 mL of sodium bicarbonate was instilled approximately 10 mL  in each lateral fornix.  The anterior lip of the cervix was then grasped  with a single-tooth tenaculum and the axis of the uterus was confirmed with  the uterine sound and the cervix was dilated to a #27 Pratt dilator. A #8  suction catheter was then easily introduced into the uterine cavity and all  contents were evacuated.  The endometrial surface felt gritty at the  conclusion of the procedure and a medium curette was then used to curette  the endometrial surface and no further products were obtained.  There was no  active bleeding at the occlusion of the procedure.  All instruments were  retired.  The  patient tolerated the procedure well and was transported to  the recovery in satisfactory condition.      CAH/MEDQ  D:  04/05/2005  T:  04/05/2005  Job:  [id number]

## 2011-05-10 NOTE — H&P (Signed)
Erica Nguyen, Erica Nguyen                 ACCOUNT NO.:  [id number]   MEDICAL RECORD NO.:  [id number]          PATIENT TYPE:  INP   LOCATION:  9167                          [facility]   PHYSICIAN:  Crist Fat. Rivard, M.D. DATE OF BIRTH:  10-Jun-1971   DATE OF ADMISSION:  02/06/2007  DATE OF DISCHARGE:                              HISTORY & PHYSICAL   This is a 40 year old gravida 3, para 0-0-2-0 at 37 weeks who presents  for elective induction of labor.  Pregnancy has been followed by Dr.  Stefano Gaul and remarkable for 1) type 2 diabetes; 2) chronic hypertension;  3) first trimester spotting; 4) increased BMI; 5) SAB x2; 6)  polyhydramnios.   ALLERGIES:  None.   OBSTETRICAL HISTORY:  Remarkable for spontaneous abortion in 2002 and  2006.   MEDICAL HISTORY:  1. Remarkable for abnormal Pap smear in the past.  2. Childhood varicella.  3. History of anemia.  4. History of chronic hypertension.  5. Type 2 diabetes, insulin-dependent.   FAMILY HISTORY:  Remarkable for mother with heart disease, father with  hypertension, grandmother with varicosities.  Mother, father and  grandparents with diabetes.  Father with kidney failure.  Father and  grandmother with stroke.  Grandmother with ovarian cancer.   SURGICAL HISTORY:  Remarkable for wisdom teeth and gallbladder  surgeries.   GENETIC HISTORY:  Remarkable for father of the baby's age of 15.   PRENATAL LABS:  Hemoglobin 11.5, platelets 311.  Blood type A positive.  Antibody screen negative.  Sickle cell negative.  RPR nonreactive.  Rubella nonimmuned.  Hepatitis negative.  HIV declined.  Pap test  normal.  Gonorrhea negative.  Chlamydia negative.   HISTORY OF CURRENT PREGNANCY:  Patient entered care at [redacted] weeks  gestation.  She was placed on labetalol for hypertension and insulin was  continued for diabetes.  She had a baseline 24-hour urine done which was  normal.  She had a quad screen done at 17 weeks which was normal.  Ultrasound  at 18 weeks was normal.  She had some pelvic pressure at 27  weeks with a negative fetal fibronectin and no cervical change.  She had  an ultrasound at 27 weeks which was normal.  Sugars remained in good  control during the pregnancy.  She had another ultrasound at 32 weeks  which was normal.  Another ultrasound at 35 weeks showed polyhydramnios  with AFI of 29.5 and BPP of 8/8.  She did have a 24-hour urine at that  time.  Group B strep was done and was positive.  She presents today for  elective induction of labor.   OBJECTIVE DATA:  VITAL SIGNS:  Stable except for a blood pressure  elevated at 152/90.  HEENT:  Within normal limits.  Thyroid normal and not enlarged.  CHEST:  Clear to auscultation.  HEART:  Regular rate and rhythm.  ABDOMEN:  Gravid, vertex.  Leopold's, although difficult to assess  secondary to polyhydramnios.  AFM shows a reactive fetal heart rate with  no contractions.  CERVIX:  Is 1-2 cm, -3 station.  Blood glucose  is 87.  EXTREMITIES:  Within normal limits.   ASSESSMENT:  1. Intrauterine pregnancy at 37 weeks.  2. Polyhydramnios.  3. Elective induction of labor.   PLAN:  1. Admit to birthing suites per Dr. Estanislado Pandy.  2. Routine MD orders.  3. Pitocin.      Marie L. Williams, C.N.M.      Crist Fat Rivard, M.D.  Electronically Signed    MLW/MEDQ  D:  02/06/2007  T:  02/06/2007  Job:  [id number]

## 2011-05-10 NOTE — Discharge Summary (Signed)
NAMEETHELEAN, COLLA                 ACCOUNT NO.:  [id number]   MEDICAL RECORD NO.:  [id number]          PATIENT TYPE:  INP   LOCATION:  9155                          [facility]   PHYSICIAN:  Janine Limbo, M.D.DATE OF BIRTH:  Sep 29, 1971   DATE OF ADMISSION:  01/27/2007  DATE OF DISCHARGE:  01/28/2007                               DISCHARGE SUMMARY   ADMITTING DIAGNOSES:  1. Intrauterine pregnancy at 35-5/7th weeks.  2. Chronic hypertension.  3. Type 2 diabetes.  4. Dyspnea.  5. Polyhydramnios.   PROCEDURE:  Amniocentesis.   DISCHARGE DIAGNOSES:  1. Intrauterine pregnancy at 35-6/7th weeks.  2. Hypertension.  3. Diabetes.  4. Polyhydramnios.  5. Dyspnea, resolved.   Ms. Rosten is a 40 year old gravida 3, para 0-0-2-0 who presents at 16-  5/7th weeks with onset of dyspnea.  She was admitted under observation.  Her chest x-ray showed minimal atelectasis.  Dr. Stefano Gaul discussed  options with the patient on the morning of January 28, 2007 and  amniocentesis was recommended to relieve some of the pressure of  polyhydramnios.  Fluid was sent for lung maturity study as well.  Amniocentesis was performed 9:30 a.m. on January 28, 2007 by Dr. Marline Backbone with note as dictated by him.  This evening findings of lung  maturity study showed LS ratio 2.5:1 with positive PG.  Patient has felt  much better throughout the day since amniocentesis and resolution of  fluid.  Her blood sugar has been stable with two hour PC at 106.  Her  NST is reactive.  Her vital signs are stable and she is afebrile.  Dr.  Stefano Gaul discussed options with the patient including induction or  continued observation in light of the fact the patient is feeling much  better and dyspnea has resolved.  The patient is encouraged to continue  observation at the present time.  She is therefore discharged home.  She  will continue her  medications for diabetes, insulin as ordered and her blood pressure  medications.  She will follow up at the office of [facility] this week as  already scheduled.  She will call for any continued dyspnea, any signs  or symptoms of labor, decreased fetal movement, vaginal bleeding,  rupture of membranes or any problems or concerns.      Rica Koyanagi, C.N.M.      Janine Limbo, M.D.  Electronically Signed    SDM/MEDQ  D:  01/28/2007  T:  01/28/2007  Job:  [id number]

## 2011-06-22 ENCOUNTER — Emergency Department (HOSPITAL_COMMUNITY)
Admission: EM | Admit: 2011-06-22 | Discharge: 2011-06-22 | Disposition: A | Discharge status: Home / Self Care | Payer: Self-pay | Attending: Emergency Medicine | Admitting: Emergency Medicine

## 2011-06-22 ENCOUNTER — Emergency Department (HOSPITAL_COMMUNITY): Payer: Self-pay

## 2011-06-22 DIAGNOSIS — I10 Essential (primary) hypertension: Secondary | ICD-10-CM | POA: Insufficient documentation

## 2011-06-22 DIAGNOSIS — R209 Unspecified disturbances of skin sensation: Secondary | ICD-10-CM | POA: Insufficient documentation

## 2011-06-22 DIAGNOSIS — M79609 Pain in unspecified limb: Secondary | ICD-10-CM | POA: Insufficient documentation

## 2011-06-22 DIAGNOSIS — Z79899 Other long term (current) drug therapy: Secondary | ICD-10-CM | POA: Insufficient documentation

## 2011-06-22 DIAGNOSIS — Z794 Long term (current) use of insulin: Secondary | ICD-10-CM | POA: Insufficient documentation

## 2011-06-22 DIAGNOSIS — R51 Headache: Secondary | ICD-10-CM | POA: Insufficient documentation

## 2011-06-22 DIAGNOSIS — E119 Type 2 diabetes mellitus without complications: Secondary | ICD-10-CM | POA: Insufficient documentation

## 2011-06-22 LAB — DIFFERENTIAL
Basophils Relative: 0 % (ref 0–1)
Eosinophils Absolute: 0.1 10*3/uL (ref 0.0–0.7)
Eosinophils Relative: 1 % (ref 0–5)
Lymphs Abs: 3.3 10*3/uL (ref 0.7–4.0)
Monocytes Absolute: 0.4 10*3/uL (ref 0.1–1.0)
Monocytes Relative: 5 % (ref 3–12)

## 2011-06-22 LAB — CBC
MCH: 29.5 pg (ref 26.0–34.0)
MCHC: 36.1 g/dL — ABNORMAL HIGH (ref 30.0–36.0)
MCV: 81.7 fL (ref 78.0–100.0)
Platelets: 236 10*3/uL (ref 150–400)
RDW: 12.9 % (ref 11.5–15.5)

## 2011-06-22 LAB — BASIC METABOLIC PANEL
CO2: 27 mEq/L (ref 19–32)
Calcium: 8.3 mg/dL — ABNORMAL LOW (ref 8.4–10.5)
Creatinine, Ser: 0.5 mg/dL (ref 0.50–1.10)
GFR calc Af Amer: >60 mL/min (ref >60)
GFR calc non Af Amer: >60 mL/min (ref >60)
Sodium: 134 mEq/L — ABNORMAL LOW (ref 135–145)

## 2011-09-12 LAB — BASIC METABOLIC PANEL
BUN: 6
GFR calc Af Amer: >60
GFR calc non Af Amer: >60
Potassium: 3.4 — ABNORMAL LOW

## 2011-09-12 LAB — CBC
MCV: 83.7
Platelets: 291
WBC: 8.7

## 2011-09-12 LAB — HCG, SERUM, QUALITATIVE: Preg, Serum: NEGATIVE

## 2011-09-16 LAB — COMPREHENSIVE METABOLIC PANEL
ALT: 12
AST: 14
Albumin: 2.6 — ABNORMAL LOW
Alkaline Phosphatase: 71
Alkaline Phosphatase: 71
BUN: 11
BUN: 6
Calcium: 8.1 — ABNORMAL LOW
Chloride: 102
Glucose, Bld: 111 — ABNORMAL HIGH
Potassium: 3 — ABNORMAL LOW
Potassium: 3.2 — ABNORMAL LOW
Sodium: 136
Total Bilirubin: 0.5
Total Protein: 5.7 — ABNORMAL LOW

## 2011-09-16 LAB — POCT CARDIAC MARKERS
CKMB, poc: <1 — ABNORMAL LOW
Myoglobin, poc: 33.1

## 2011-09-16 LAB — CK TOTAL AND CKMB (NOT AT ARMC)
Relative Index: INVALID
Relative Index: INVALID
Total CK: 75

## 2011-09-16 LAB — I-STAT 8, (EC8 V) (CONVERTED LAB)
BUN: 11
Chloride: 106
HCT: 35 — ABNORMAL LOW
Hemoglobin: 11.9 — ABNORMAL LOW
Operator id: 277751
Sodium: 142

## 2011-09-16 LAB — TROPONIN I
Troponin I: 0.01
Troponin I: 0.01
Troponin I: 0.01

## 2011-09-16 LAB — CBC
HCT: 34.1 — ABNORMAL LOW
Platelets: 293
RBC: 3.99
WBC: 11.9 — ABNORMAL HIGH

## 2011-09-16 LAB — DIFFERENTIAL
Eosinophils Relative: 1
Lymphocytes Relative: 31
Lymphs Abs: 3.7
Neutrophils Relative %: 62

## 2011-09-16 LAB — POCT I-STAT CREATININE
Creatinine, Ser: 0.9
Operator id: 277751

## 2011-10-01 LAB — POCT URINALYSIS DIP (DEVICE)
Glucose, UA: >=1000 — AB
Hgb urine dipstick: NEGATIVE
Nitrite: NEGATIVE
Protein, ur: 30 — AB
Urobilinogen, UA: 0.2
pH: 6

## 2011-10-04 LAB — POCT PREGNANCY, URINE
Operator id: 196461
Preg Test, Ur: NEGATIVE

## 2011-10-04 LAB — URINALYSIS, ROUTINE W REFLEX MICROSCOPIC
Bilirubin Urine: NEGATIVE
Glucose, UA: NEGATIVE
Protein, ur: 30 — AB
Urobilinogen, UA: 0.2

## 2011-10-04 LAB — DIFFERENTIAL
Basophils Relative: 0
Eosinophils Absolute: 0.1
Lymphs Abs: 2.4
Monocytes Absolute: 0.4
Monocytes Relative: 6

## 2011-10-04 LAB — I-STAT 8, (EC8 V) (CONVERTED LAB)
BUN: 9
Bicarbonate: 28.3 — ABNORMAL HIGH
Chloride: 105
Operator id: 196461
pCO2, Ven: 49.4
pH, Ven: 7.366 — ABNORMAL HIGH

## 2011-10-04 LAB — POCT I-STAT CREATININE: Creatinine, Ser: 0.7

## 2011-10-04 LAB — URINE MICROSCOPIC-ADD ON

## 2011-10-04 LAB — CBC
MCHC: 33.1
MCV: 82.4
RDW: 14.6 — ABNORMAL HIGH

## 2012-01-06 ENCOUNTER — Ambulatory Visit (INDEPENDENT_AMBULATORY_CARE_PROVIDER_SITE_OTHER): Payer: Medicaid Other | Admitting: Cardiovascular Disease

## 2012-01-06 ENCOUNTER — Encounter: Payer: Self-pay | Admitting: *Deleted

## 2012-01-06 DIAGNOSIS — R079 Chest pain, unspecified: Secondary | ICD-10-CM | POA: Insufficient documentation

## 2012-01-06 DIAGNOSIS — R0609 Other forms of dyspnea: Secondary | ICD-10-CM

## 2012-01-06 DIAGNOSIS — E119 Type 2 diabetes mellitus without complications: Secondary | ICD-10-CM

## 2012-01-06 DIAGNOSIS — I10 Essential (primary) hypertension: Secondary | ICD-10-CM

## 2012-01-06 DIAGNOSIS — R0600 Dyspnea, unspecified: Secondary | ICD-10-CM

## 2012-01-06 NOTE — Assessment & Plan Note (Signed)
Stressed low carb diet and exercise Target A1c 6.5 or less

## 2012-01-06 NOTE — Assessment & Plan Note (Signed)
Normal exam ECG CXR  Start with EF evaluation by myovue  Consider Ddimer and BNP

## 2012-01-06 NOTE — Assessment & Plan Note (Signed)
Would prefer for her to be on ARB or ACE  F/U with primary to discuss

## 2012-01-06 NOTE — Patient Instructions (Addendum)
Your physician recommends that you schedule a follow-up appointment in: WITH DR Eden Emms  AFTER MYOVIEW Your physician recommends that you continue on your current medications as directed. Please refer to the Current Medication list given to you today.  Your physician has requested that you have en exercise stress myoview. For further information please visit [email or web address] Please follow instruction sheet, as given. DX CHEST PAIN

## 2012-01-06 NOTE — Progress Notes (Signed)
Patient ID: Erica Nguyen, female   DOB: July 01, 1971, 41 y.o.   MRN: [id number] 41 yo referred for SSCP and dyspnea.  Overweight long standing IDDM.  Had a cath by Dr Jacinto Halim in 2009 that was normal.  URI about a month ago Rx with [employer]pack.  No cough fever sputum or pleurisy.  Pain is in left chest and shoulder.  Not always exertional  Radiates down arm.  Dyspnea is exertional.  Worse at night when she tries to sleep.  No history of DVT or LE edema.  BS not ideally controlled.  Poor diet.  Last HbA1c 7.5 and am BS around 130-140  Was on lisinopril until 4 months ago and then changed to amlodipine??   ROS: Denies fever, malais, weight loss, blurry vision, decreased visual acuity, cough, sputum, SOB, hemoptysis, pleuritic pain, palpitaitons, heartburn, abdominal pain, melena, lower extremity edema, claudication, or rash.  All other systems reviewed and negative   General: Affect appropriate Healthy:  appears stated age HEENT: normal Neck supple with no adenopathy JVP normal no bruits no thyromegaly Lungs clear with no wheezing and good diaphragmatic motion Heart:  S1/S2 no murmur,rub, gallop or click PMI normal Abdomen: benighn, BS positve, no tenderness, no AAA no bruit.  No HSM or HJR Distal pulses intact with no bruits No edema Neuro non-focal Skin warm and dry No muscular weakness  Medications Current Outpatient Prescriptions  Medication Sig Dispense Refill  . insulin lispro protamine-insulin lispro (HUMALOG 75/25) (75-25) 100 UNIT/ML SUSP Inject 20 Units into the skin daily with breakfast.      . metFORMIN (GLUCOPHAGE) 500 MG tablet Take 500 mg by mouth 2 (two) times daily with a meal.        Allergies Amoxicillin  Family History: Family History  Problem Relation Age of Onset  . Heart disease Mother   . Hypertension Father   . Hypertension Paternal Grandmother   . Diabetes Mother   . Diabetes Father   . Diabetes Maternal Grandmother   . Diabetes Maternal Grandfather   .  Diabetes Paternal Grandmother   . Diabetes Paternal Grandfather   . Kidney failure Father   . Stroke Father   . Stroke Paternal Grandmother   . Ovarian cancer Maternal Grandmother     Social History: History   Social History  . Marital Status: Single    Spouse Name: N/A    Number of Children: N/A  . Years of Education: N/A   Occupational History  . Not on file.   Social History Main Topics  . Smoking status: Never Smoker   . Smokeless tobacco: Not on file  . Alcohol Use: Not on file  . Drug Use: Not on file  . Sexually Active: Not on file   Other Topics Concern  . Not on file   Social History Narrative  . No narrative on file   CXR:  06/22/11  Reviewed NAD and no cardiomegaly Electrocardiogram:  714//12 NSR rate 83  Normal ECG  Assessment and Plan

## 2012-01-06 NOTE — Assessment & Plan Note (Signed)
Atypical but poorly controlled DM Last eval over 3 years ago  Myovue

## 2012-01-13 ENCOUNTER — Encounter (HOSPITAL_COMMUNITY): Payer: Medicaid Other | Admitting: Radiology

## 2012-01-17 ENCOUNTER — Ambulatory Visit: Payer: Medicaid Other | Admitting: Cardiovascular Disease

## 2012-03-03 ENCOUNTER — Encounter: Payer: Self-pay | Admitting: Cardiovascular Disease

## 2012-03-19 ENCOUNTER — Emergency Department (HOSPITAL_COMMUNITY): Payer: Self-pay

## 2012-03-19 ENCOUNTER — Encounter (HOSPITAL_COMMUNITY): Payer: Self-pay | Admitting: Emergency Medicine

## 2012-03-19 ENCOUNTER — Emergency Department (HOSPITAL_COMMUNITY)
Admission: EM | Admit: 2012-03-19 | Discharge: 2012-03-19 | Disposition: A | Discharge status: Home / Self Care | Payer: Self-pay | Attending: Emergency Medicine | Admitting: Emergency Medicine

## 2012-03-19 DIAGNOSIS — Z794 Long term (current) use of insulin: Secondary | ICD-10-CM | POA: Insufficient documentation

## 2012-03-19 DIAGNOSIS — R000 Tachycardia, unspecified: Secondary | ICD-10-CM | POA: Insufficient documentation

## 2012-03-19 DIAGNOSIS — R112 Nausea with vomiting, unspecified: Secondary | ICD-10-CM | POA: Insufficient documentation

## 2012-03-19 DIAGNOSIS — I10 Essential (primary) hypertension: Secondary | ICD-10-CM | POA: Insufficient documentation

## 2012-03-19 DIAGNOSIS — E1110 Type 2 diabetes mellitus with ketoacidosis without coma: Secondary | ICD-10-CM | POA: Insufficient documentation

## 2012-03-19 DIAGNOSIS — E119 Type 2 diabetes mellitus without complications: Secondary | ICD-10-CM | POA: Insufficient documentation

## 2012-03-19 DIAGNOSIS — E860 Dehydration: Secondary | ICD-10-CM | POA: Insufficient documentation

## 2012-03-19 DIAGNOSIS — R197 Diarrhea, unspecified: Secondary | ICD-10-CM | POA: Insufficient documentation

## 2012-03-19 LAB — POCT I-STAT, CHEM 8
Calcium, Ion: 0.97 mmol/L — ABNORMAL LOW (ref 1.12–1.32)
Chloride: 100 mEq/L (ref 96–112)
Glucose, Bld: 353 mg/dL — ABNORMAL HIGH (ref 70–99)
HCT: 44 % (ref 36.0–46.0)
TCO2: 22 mmol/L (ref 0–100)

## 2012-03-19 LAB — CBC
HCT: 39.8 % (ref 36.0–46.0)
Hemoglobin: 13.7 g/dL (ref 12.0–15.0)
MCV: 85 fL (ref 78.0–100.0)
WBC: 11.5 10*3/uL — ABNORMAL HIGH (ref 4.0–10.5)

## 2012-03-19 LAB — GLUCOSE, CAPILLARY
Glucose-Capillary: 338 mg/dL — ABNORMAL HIGH (ref 70–99)
Glucose-Capillary: 231 mg/dL — ABNORMAL HIGH (ref 70–99)
Glucose-Capillary: 267 mg/dL — ABNORMAL HIGH (ref 70–99)
Glucose-Capillary: 279 mg/dL — ABNORMAL HIGH (ref 70–99)
Glucose-Capillary: 222 mg/dL — ABNORMAL HIGH (ref 70–99)
Glucose-Capillary: 237 mg/dL — ABNORMAL HIGH (ref 70–99)

## 2012-03-19 LAB — URINALYSIS, ROUTINE W REFLEX MICROSCOPIC
Bilirubin Urine: NEGATIVE
Glucose, UA: >1000 mg/dL — AB
Hgb urine dipstick: NEGATIVE
Nitrite: NEGATIVE
Specific Gravity, Urine: >1.046 — ABNORMAL HIGH (ref 1.005–1.030)
pH: 6 (ref 5.0–8.0)

## 2012-03-19 LAB — BASIC METABOLIC PANEL
CO2: 20 mEq/L (ref 19–32)
Calcium: 8 mg/dL — ABNORMAL LOW (ref 8.4–10.5)
Chloride: 95 mEq/L — ABNORMAL LOW (ref 96–112)
Glucose, Bld: 340 mg/dL — ABNORMAL HIGH (ref 70–99)
Sodium: 132 mEq/L — ABNORMAL LOW (ref 135–145)

## 2012-03-19 LAB — DIFFERENTIAL
Eosinophils Relative: 0 % (ref 0–5)
Lymphocytes Relative: 8 % — ABNORMAL LOW (ref 12–46)
Lymphs Abs: 0.9 10*3/uL (ref 0.7–4.0)
Monocytes Absolute: 0.7 10*3/uL (ref 0.1–1.0)
Monocytes Relative: 6 % (ref 3–12)
Neutro Abs: 10 10*3/uL — ABNORMAL HIGH (ref 1.7–7.7)

## 2012-03-19 LAB — URINE MICROSCOPIC-ADD ON

## 2012-03-19 MED ORDER — SODIUM CHLORIDE 0.9 % IV BOLUS (SEPSIS)
1000.0000 mL | Freq: Once | INTRAVENOUS | Status: AC
  Administered 2012-03-19: 1000 mL via INTRAVENOUS

## 2012-03-19 MED ORDER — FENTANYL CITRATE 0.05 MG/ML IJ SOLN
50.0000 ug | Freq: Once | INTRAMUSCULAR | Status: AC
  Administered 2012-03-19: 04:00:00 via INTRAVENOUS
  Filled 2012-03-19: qty 2

## 2012-03-19 MED ORDER — SODIUM CHLORIDE 0.9 % IV BOLUS (SEPSIS): 1000.0000 mL | Freq: Once | INTRAVENOUS | Status: DC

## 2012-03-19 MED ORDER — ONDANSETRON HCL 4 MG/2ML IJ SOLN
4.0000 mg | Freq: Once | INTRAMUSCULAR | Status: AC
  Administered 2012-03-19: 4 mg via INTRAVENOUS
  Filled 2012-03-19: qty 2

## 2012-03-19 MED ORDER — INSULIN REGULAR BOLUS VIA INFUSION
0.0000 [IU] | Freq: Three times a day (TID) | INTRAVENOUS | Status: DC
  Filled 2012-03-19: qty 10

## 2012-03-19 MED ORDER — ONDANSETRON HCL 4 MG/2ML IJ SOLN: 4.0000 mg | Freq: Once | INTRAMUSCULAR | Status: DC

## 2012-03-19 MED ORDER — POTASSIUM CHLORIDE ER 8 MEQ PO TBCR: 8.0000 meq | EXTENDED_RELEASE_TABLET | Freq: Two times a day (BID) | ORAL | Status: DC

## 2012-03-19 MED ORDER — ONDANSETRON HCL 4 MG PO TABS: 4.0000 mg | ORAL_TABLET | Freq: Three times a day (TID) | ORAL | Status: AC | PRN

## 2012-03-19 MED ORDER — DIPHENOXYLATE-ATROPINE 2.5-0.025 MG PO TABS: 1.0000 | ORAL_TABLET | Freq: Four times a day (QID) | ORAL | Status: AC | PRN

## 2012-03-19 MED ORDER — INSULIN REGULAR HUMAN 100 UNIT/ML IJ SOLN: 4.0000 [IU] | Freq: Once | INTRAMUSCULAR | Status: DC

## 2012-03-19 MED ORDER — INSULIN REGULAR HUMAN 100 UNIT/ML IJ SOLN
INTRAMUSCULAR | Status: DC
  Administered 2012-03-19: 2.1 [IU]/h via INTRAVENOUS
  Filled 2012-03-19: qty 1

## 2012-03-19 MED ORDER — INSULIN REGULAR HUMAN 100 UNIT/ML IJ SOLN
4.0000 [IU] | Freq: Once | INTRAMUSCULAR | Status: DC
  Filled 2012-03-19: qty 0.04

## 2012-03-19 MED ORDER — ONDANSETRON HCL 8 MG PO TABS: 8.0000 mg | ORAL_TABLET | ORAL | Status: AC | PRN

## 2012-03-19 MED ORDER — DEXTROSE-NACL 5-0.45 % IV SOLN: INTRAVENOUS | Status: DC

## 2012-03-19 MED ORDER — DEXTROSE 50 % IV SOLN: 25.0000 mL | INTRAVENOUS | Status: DC | PRN

## 2012-03-19 MED ORDER — OXYCODONE-ACETAMINOPHEN 5-325 MG PO TABS: 2.0000 | ORAL_TABLET | ORAL | Status: AC | PRN

## 2012-03-19 MED ORDER — INSULIN ASPART 100 UNIT/ML ~~LOC~~ SOLN
4.0000 [IU] | Freq: Once | SUBCUTANEOUS | Status: AC
  Administered 2012-03-19: 4 [IU] via INTRAVENOUS
  Filled 2012-03-19: qty 1

## 2012-03-19 MED ORDER — POTASSIUM CHLORIDE ER 10 MEQ PO TBCR: 20.0000 meq | EXTENDED_RELEASE_TABLET | Freq: Two times a day (BID) | ORAL | Status: DC

## 2012-03-19 MED ORDER — SODIUM CHLORIDE 0.9 % IV SOLN
INTRAVENOUS | Status: DC
  Administered 2012-03-19: 07:00:00 via INTRAVENOUS

## 2012-03-19 NOTE — ED Provider Notes (Signed)
History     CSN: [id number]  Arrival date & time 03/19/12  0214   First MD Initiated Contact with Patient 03/19/12 0257      Chief Complaint  Patient presents with  . Emesis  . Diarrhea    (Consider location/radiation/quality/duration/timing/severity/associated sxs/prior treatment) HPI  Past Medical History  Diagnosis Date  . Hypertension     chronic hypertension  . Diabetes mellitus     insulin-dependent  . Abnormal Pap smear of vagina   . History of anemia   . Polyhydramnios   . History of varicella     Childhood Varicell    Past Surgical History  Procedure Date  . Cardiac catheterization 02/29/2008    Normal coronary arteries -- Normal LV (left ventricular) systolic function -- Mild to moderate elevation in left ventricular end-diastolic  pressure secondary to hypertension, diabetes and obesity  . Novasure ablation 01/08/2008  . Cholecystectomy 1995  . Hysteroscopy, d&c, novasure ablation, removal of intrauterine device 01/08/2008    Family History  Problem Relation Age of Onset  . Heart disease Mother   . Diabetes Mother   . Hypertension Father   . Diabetes Father   . Kidney failure Father   . Stroke Father   . Hypertension Paternal Grandmother   . Diabetes Paternal Grandmother   . Stroke Paternal Grandmother   . Diabetes Maternal Grandmother   . Ovarian cancer Maternal Grandmother   . Diabetes Maternal Grandfather   . Diabetes Paternal Grandfather     History  Substance Use Topics  . Smoking status: Never Smoker   . Smokeless tobacco: Never Used  . Alcohol Use: No    OB History    Grav Para Term Preterm Abortions TAB SAB Ect Mult Living                  Review of Systems  Allergies  Amoxicillin  Home Medications   Current Outpatient Rx  Name Route Sig Dispense Refill  . AMLODIPINE BESYLATE 5 MG PO TABS Oral Take 5 mg by mouth daily.    . INSULIN LISPRO PROT & LISPRO (75-25) 100 UNIT/ML  SUSP Subcutaneous Inject 20-40 Units into the  skin daily with breakfast. 20 units in the morning and 40 units at dinner    . DIPHENOXYLATE-ATROPINE 2.5-0.025 MG PO TABS Oral Take 1 tablet by mouth 4 (four) times daily as needed for diarrhea or loose stools. 15 tablet 0  . METFORMIN HCL 500 MG PO TABS Oral Take 500 mg by mouth 2 (two) times daily with a meal.    . ONDANSETRON HCL 4 MG PO TABS Oral Take 1 tablet (4 mg total) by mouth every 8 (eight) hours as needed for nausea. 20 tablet 0  . ONDANSETRON HCL 8 MG PO TABS Oral Take 1 tablet (8 mg total) by mouth every 4 (four) hours as needed for nausea. 8 tablet 0  . OXYCODONE-ACETAMINOPHEN 5-325 MG PO TABS Oral Take 2 tablets by mouth every 4 (four) hours as needed for pain. 15 tablet 0  . POTASSIUM CHLORIDE ER 10 MEQ PO TBCR Oral Take 2 tablets (20 mEq total) by mouth 2 (two) times daily. For 2 days 4 tablet 0  . POTASSIUM CHLORIDE ER 8 MEQ PO TBCR Oral Take 1 tablet (8 mEq total) by mouth 2 (two) times daily. 20 tablet 0    BP 147/79  Pulse 96  Temp(Src) 97.8 F (36.6 C) (Oral)  Resp 18  SpO2 100%  LMP 03/01/2012  Physical Exam  ED  Course  Procedures (including critical care time)  Labs Reviewed  GLUCOSE, CAPILLARY - Abnormal; Notable for the following:    Glucose-Capillary 338 (*)    All other components within normal limits  CBC - Abnormal; Notable for the following:    WBC 11.5 (*)    All other components within normal limits  DIFFERENTIAL - Abnormal; Notable for the following:    Neutrophils Relative 87 (*)    Neutro Abs 10.0 (*)    Lymphocytes Relative 8 (*)    All other components within normal limits  URINALYSIS, ROUTINE W REFLEX MICROSCOPIC - Abnormal; Notable for the following:    APPearance CLOUDY (*)    Specific Gravity, Urine >1.046 (*)    Glucose, UA >1000 (*)    Ketones, ur 40 (*)    Protein, ur 30 (*)    All other components within normal limits  POCT I-STAT, CHEM 8 - Abnormal; Notable for the following:    Potassium 3.3 (*)    Glucose, Bld 353 (*)     Calcium, Ion 0.97 (*)    All other components within normal limits  URINE MICROSCOPIC-ADD ON - Abnormal; Notable for the following:    Squamous Epithelial / LPF MANY (*)    Bacteria, UA MANY (*)    All other components within normal limits  BASIC METABOLIC PANEL - Abnormal; Notable for the following:    Sodium 132 (*)    Potassium 3.1 (*)    Chloride 95 (*)    Glucose, Bld 340 (*)    Calcium 8.0 (*)    All other components within normal limits  GLUCOSE, CAPILLARY - Abnormal; Notable for the following:    Glucose-Capillary 267 (*)    All other components within normal limits  GLUCOSE, CAPILLARY - Abnormal; Notable for the following:    Glucose-Capillary 279 (*)    All other components within normal limits  GLUCOSE, CAPILLARY - Abnormal; Notable for the following:    Glucose-Capillary 231 (*)    All other components within normal limits  GLUCOSE, CAPILLARY - Abnormal; Notable for the following:    Glucose-Capillary 222 (*)    All other components within normal limits  GLUCOSE, CAPILLARY - Abnormal; Notable for the following:    Glucose-Capillary 237 (*)    All other components within normal limits  POCT PREGNANCY, URINE  URINE CULTURE   Dg Abd Acute W/chest  03/19/2012  *RADIOLOGY REPORT*  Clinical Data: Nausea, vomiting and diarrhea.  ACUTE ABDOMEN SERIES (ABDOMEN 2 VIEW & CHEST 1 VIEW)  Comparison: Chest radiograph performed 06/22/2011  Findings: The lungs are relatively well-aerated and clear.  There is no evidence of focal opacification, pleural effusion or pneumothorax.  The cardiomediastinal silhouette is within normal limits.  The visualized bowel gas pattern is unremarkable.  The colon is filled with air and fluid; there is no evidence of small bowel dilatation to suggest obstruction.  No free intra-abdominal air is identified on the provided upright view.  No acute osseous abnormalities are seen; the sacroiliac joints are unremarkable in appearance.  Clips are noted within the  right upper quadrant, reflecting prior cholecystectomy.  IMPRESSION:  1.  Colon filled with air and fluid, without evidence of obstruction.  No free intra-abdominal air seen. 2.  No acute cardiopulmonary process seen.  Original Report Authenticated By: Tonia Ghent, M.D.     1. Dehydration   2. Diabetic acidosis       MDM  Patient consulted by [facility]. They felt patient could go home.  I rechecked patient 09 30. She was alert, taking fluids, no acute abdomen. She agreed with discharge. We'll discharge home on oxycodone, Lomotil, Zofran, potassium replacement. She understands return if worse.        Donnetta Hutching, MD 03/19/12 [phone]

## 2012-03-19 NOTE — ED Notes (Signed)
Admitting MD in with pt    

## 2012-03-19 NOTE — Discharge Instructions (Signed)
Medications for pain, nausea, diarrhea, potassium replacement. Crease fluids. Check your sugars frequently. Followup your primary care Dr. or return if worse

## 2012-03-19 NOTE — ED Notes (Signed)
Pt states she has had N/V/D  Pt states sxs started on Tuesday improved on Wednesday but then around 10pm she started vomiting again  Pt states is unable to hold anything down  Pt states she is diabetic and has not taken her insulin for the past 2 days

## 2012-03-19 NOTE — Consult Note (Signed)
Requesting physician: Dr. Nicanor Alcon   Reason for consultation: N/v/diarrhea, ?DKA   History of Present Illness: 41 y/o with h/o IDDM, HTN who presents to the ED with a 2 day history of n/v/diarrhea. No sick contacts. No abdominal pain. Labs in the ED show a K of 3.3, CO2 22 AG 14. We are asked to see her for potential admission for DKA. Patient has been started on glucostabilizer. Highest documented CBG is 267. Of note, patient has not taken her home insulin in 2 days 2/2 n/v/decreased appetite.   Allergies:   Allergies  Allergen Reactions  . Amoxicillin     Give pt yeast infection      Past Medical History  Diagnosis Date  . Hypertension     chronic hypertension  . Diabetes mellitus     insulin-dependent  . Abnormal Pap smear of vagina   . History of anemia   . Polyhydramnios   . History of varicella     Childhood Varicell    Past Surgical History  Procedure Date  . Cardiac catheterization 02/29/2008    Normal coronary arteries -- Normal LV (left ventricular) systolic function -- Mild to moderate elevation in left ventricular end-diastolic  pressure secondary to hypertension, diabetes and obesity  . Novasure ablation 01/08/2008  . Cholecystectomy 1995  . Hysteroscopy, d&c, novasure ablation, removal of intrauterine device 01/08/2008    Scheduled Meds:   . fentaNYL  50 mcg Intravenous Once  . insulin aspart  4 Units Intravenous Once  . insulin regular  0-10 Units Intravenous TID WC  . ondansetron (ZOFRAN) IV  4 mg Intravenous Once  . ondansetron (ZOFRAN) IV  4 mg Intravenous Once  . ondansetron (ZOFRAN) IV  4 mg Intravenous Once  . sodium chloride  1,000 mL Intravenous Once  . sodium chloride  1,000 mL Intravenous Once  . sodium chloride  1,000 mL Intravenous Once  . DISCONTD: insulin regular  4 Units Intravenous Once  . DISCONTD: insulin regular  4 Units Subcutaneous Once  . DISCONTD: insulin regular  4 Units Intravenous Once  . DISCONTD: ondansetron (ZOFRAN) IV  4  mg Intravenous Once   Continuous Infusions:   . sodium chloride 1,000 mL/hr at 03/19/12 0711  . insulin (NOVOLIN-R) infusion Stopped (03/19/12 0629)  . DISCONTD: dextrose 5 % and 0.45% NaCl     PRN Meds:.dextrose  Social History:  reports that she has never smoked. She has never used smokeless tobacco. She reports that she does not drink alcohol or use illicit drugs.  Family History  Problem Relation Age of Onset  . Heart disease Mother   . Diabetes Mother   . Hypertension Father   . Diabetes Father   . Kidney failure Father   . Stroke Father   . Hypertension Paternal Grandmother   . Diabetes Paternal Grandmother   . Stroke Paternal Grandmother   . Diabetes Maternal Grandmother   . Ovarian cancer Maternal Grandmother   . Diabetes Maternal Grandfather   . Diabetes Paternal Grandfather     Review of Systems:  Negative except as mentioned in HPI  Physical Exam: Blood pressure 147/79, pulse 96, temperature 97.8 F (36.6 C), temperature source Oral, resp. rate 18, last menstrual period 03/01/2012, SpO2 100.00%. Gen: AAOX3, pleasant, no distress. HEENT: [place], AT, moist mucous membranes. CV: RRR, no M/R/G Lungs: CTA B Abd: obese, soft, NT, ND, positive BS, no masses or organomegaly. Ext: no C/C/E, pos pedal pulses Neuro: intact, nonfocal   Labs on Admission:  Results for orders placed  during the hospital encounter of 03/19/12 (from the past 48 hour(s))  GLUCOSE, CAPILLARY     Status: Abnormal   Collection Time   03/19/12  2:54 AM      Component Value Range Comment   Glucose-Capillary 338 (*) 70 - 99 (mg/dL)   CBC     Status: Abnormal   Collection Time   03/19/12  3:10 AM      Component Value Range Comment   WBC 11.5 (*) 4.0 - 10.5 (K/uL)    RBC 4.68  3.87 - 5.11 (MIL/uL)    Hemoglobin 13.7  12.0 - 15.0 (g/dL)    HCT 16.1  09.6 - 04.5 (%)    MCV 85.0  78.0 - 100.0 (fL)    MCH 29.3  26.0 - 34.0 (pg)    MCHC 34.4  30.0 - 36.0 (g/dL)    RDW 40.9  81.1 - 91.4 (%)     Platelets 231  150 - 400 (K/uL)   DIFFERENTIAL     Status: Abnormal   Collection Time   03/19/12  3:10 AM      Component Value Range Comment   Neutrophils Relative 87 (*) 43 - 77 (%)    Neutro Abs 10.0 (*) 1.7 - 7.7 (K/uL)    Lymphocytes Relative 8 (*) 12 - 46 (%)    Lymphs Abs 0.9  0.7 - 4.0 (K/uL)    Monocytes Relative 6  3 - 12 (%)    Monocytes Absolute 0.7  0.1 - 1.0 (K/uL)    Eosinophils Relative 0  0 - 5 (%)    Eosinophils Absolute 0.0  0.0 - 0.7 (K/uL)    Basophils Relative 0  0 - 1 (%)    Basophils Absolute 0.0  0.0 - 0.1 (K/uL)   BASIC METABOLIC PANEL     Status: Abnormal   Collection Time   03/19/12  3:10 AM      Component Value Range Comment   Sodium 132 (*) 135 - 145 (mEq/L)    Potassium 3.1 (*) 3.5 - 5.1 (mEq/L)    Chloride 95 (*) 96 - 112 (mEq/L)    CO2 20  19 - 32 (mEq/L)    Glucose, Bld 340 (*) 70 - 99 (mg/dL)    BUN 11  6 - 23 (mg/dL)    Creatinine, Ser 7.82  0.50 - 1.10 (mg/dL)    Calcium 8.0 (*) 8.4 - 10.5 (mg/dL)    GFR calc non Af Amer >90  >90 (mL/min)    GFR calc Af Amer >90  >90 (mL/min)   URINALYSIS, ROUTINE W REFLEX MICROSCOPIC     Status: Abnormal   Collection Time   03/19/12  3:53 AM      Component Value Range Comment   Color, Urine YELLOW  YELLOW     APPearance CLOUDY (*) CLEAR     Specific Gravity, Urine >1.046 (*) 1.005 - 1.030     pH 6.0  5.0 - 8.0     Glucose, UA >1000 (*) NEGATIVE (mg/dL)    Hgb urine dipstick NEGATIVE  NEGATIVE     Bilirubin Urine NEGATIVE  NEGATIVE     Ketones, ur 40 (*) NEGATIVE (mg/dL)    Protein, ur 30 (*) NEGATIVE (mg/dL)    Urobilinogen, UA 0.2  0.0 - 1.0 (mg/dL)    Nitrite NEGATIVE  NEGATIVE     Leukocytes, UA NEGATIVE  NEGATIVE    URINE MICROSCOPIC-ADD ON     Status: Abnormal   Collection Time   03/19/12  3:53 AM      Component Value Range Comment   Squamous Epithelial / LPF MANY (*) RARE     WBC, UA 7-10  <3 (WBC/hpf)    RBC / HPF 3-6  <3 (RBC/hpf)    Bacteria, UA MANY (*) RARE     Urine-Other RARE YEAST    MUCOUS PRESENT  POCT PREGNANCY, URINE     Status: Normal   Collection Time   03/19/12  3:56 AM      Component Value Range Comment   Preg Test, Ur NEGATIVE  NEGATIVE    POCT I-STAT, CHEM 8     Status: Abnormal   Collection Time   03/19/12  4:24 AM      Component Value Range Comment   Sodium 136  135 - 145 (mEq/L)    Potassium 3.3 (*) 3.5 - 5.1 (mEq/L)    Chloride 100  96 - 112 (mEq/L)    BUN 11  6 - 23 (mg/dL)    Creatinine, Ser 6.96  0.50 - 1.10 (mg/dL)    Glucose, Bld 295 (*) 70 - 99 (mg/dL)    Calcium, Ion 2.84 (*) 1.12 - 1.32 (mmol/L)    TCO2 22  0 - 100 (mmol/L)    Hemoglobin 15.0  12.0 - 15.0 (g/dL)    HCT 13.2  44.0 - 10.2 (%)   GLUCOSE, CAPILLARY     Status: Abnormal   Collection Time   03/19/12  5:45 AM      Component Value Range Comment   Glucose-Capillary 267 (*) 70 - 99 (mg/dL)   GLUCOSE, CAPILLARY     Status: Abnormal   Collection Time   03/19/12  6:03 AM      Component Value Range Comment   Glucose-Capillary 279 (*) 70 - 99 (mg/dL)   GLUCOSE, CAPILLARY     Status: Abnormal   Collection Time   03/19/12  6:23 AM      Component Value Range Comment   Glucose-Capillary 231 (*) 70 - 99 (mg/dL)   GLUCOSE, CAPILLARY     Status: Abnormal   Collection Time   03/19/12  6:51 AM      Component Value Range Comment   Glucose-Capillary 222 (*) 70 - 99 (mg/dL)   GLUCOSE, CAPILLARY     Status: Abnormal   Collection Time   03/19/12  8:59 AM      Component Value Range Comment   Glucose-Capillary 237 (*) 70 - 99 (mg/dL)    Comment 1 Documented in Chart      Comment 2 Notify RN       Radiological Exams on Admission: Dg Abd Acute W/chest  03/19/2012  *RADIOLOGY REPORT*  Clinical Data: Nausea, vomiting and diarrhea.  ACUTE ABDOMEN SERIES (ABDOMEN 2 VIEW & CHEST 1 VIEW)  Comparison: Chest radiograph performed 06/22/2011  Findings: The lungs are relatively well-aerated and clear.  There is no evidence of focal opacification, pleural effusion or pneumothorax.  The cardiomediastinal  silhouette is within normal limits.  The visualized bowel gas pattern is unremarkable.  The colon is filled with air and fluid; there is no evidence of small bowel dilatation to suggest obstruction.  No free intra-abdominal air is identified on the provided upright view.  No acute osseous abnormalities are seen; the sacroiliac joints are unremarkable in appearance.  Clips are noted within the right upper quadrant, reflecting prior cholecystectomy.  IMPRESSION:  1.  Colon filled with air and fluid, without evidence of obstruction.  No free intra-abdominal air seen. 2.  No acute cardiopulmonary process seen.  Original Report Authenticated By: Tonia Ghent, M.D.    Assessment/Plan #1 N/V/Diarrhea: likely viral gastroenteritis. ?norovirus. Is already improving. See no indication as to why admitting her to the hospital would be of benefit. SHe is tolerating POs. No vomiting in a few hours.  #2 ?DKA: Pt is surely hyperglycemic, but not in DKA with a bicarb of 22. Her AG is only slightly elevated at 14, which goes more with mild dehydration/starvation from her GI illness. She has not been taking her insulin in 2 days. Have asked her to restart home insulin regimen. Have instructed to check her CBG at least twice a day until she next sees her PCP.  #3 Hypokalemia: 2/2 GI losses. Will give 2 days of PO supplementation.  #4 Dispo: It is my opinion that patient can safely be discharged home. Will followup with her PCP early next week for a recheck on her K. Will give potassium and zofran prescriptions.   Time Spent on Consultation: 45 minutes  HERNANDEZ ACOSTA,Searcy Miyoshi [facility]  [phone] 03/19/2012, 9:09 AM

## 2012-03-19 NOTE — ED Provider Notes (Signed)
History     CSN: [id number]  Arrival date & time 03/19/12  0214   First MD Initiated Contact with Patient 03/19/12 0257      Chief Complaint  Patient presents with  . Emesis  . Diarrhea    (Consider location/radiation/quality/duration/timing/severity/associated sxs/prior treatment) Patient is a 41 y.o. female presenting with vomiting and diarrhea. The history is provided by the patient. No language interpreter was used.  Emesis  This is a recurrent problem. The current episode started more than 2 days ago. The problem occurs 2 to 4 times per day. The problem has not changed since onset.The emesis has an appearance of stomach contents. There has been no fever. Associated symptoms include diarrhea. Pertinent negatives include no arthralgias, no chills, no cough, no myalgias, no sweats and no URI. Risk factors: unknown.  Diarrhea The primary symptoms include nausea, vomiting and diarrhea. Primary symptoms do not include myalgias or arthralgias. The illness began 3 to 5 days ago. Progression since onset: went away and returned this evening.  The illness does not include chills, dysphagia, bloating, constipation, tenesmus, back pain or itching. Associated medical issues do not include PUD. Risk factors: none.    Past Medical History  Diagnosis Date  . Hypertension     chronic hypertension  . Diabetes mellitus     insulin-dependent  . Abnormal Pap smear of vagina   . History of anemia   . Polyhydramnios   . History of varicella     Childhood Varicell    Past Surgical History  Procedure Date  . Cardiac catheterization 02/29/2008    Normal coronary arteries -- Normal LV (left ventricular) systolic function -- Mild to moderate elevation in left ventricular end-diastolic  pressure secondary to hypertension, diabetes and obesity  . Novasure ablation 01/08/2008  . Cholecystectomy     Family History  Problem Relation Age of Onset  . Heart disease Mother   . Hypertension Father   .  Hypertension Paternal Grandmother   . Diabetes Mother   . Diabetes Father   . Diabetes Maternal Grandmother   . Diabetes Maternal Grandfather   . Diabetes Paternal Grandmother   . Diabetes Paternal Grandfather   . Kidney failure Father   . Stroke Father   . Stroke Paternal Grandmother   . Ovarian cancer Maternal Grandmother     History  Substance Use Topics  . Smoking status: Never Smoker   . Smokeless tobacco: Not on file  . Alcohol Use: No    OB History    Grav Para Term Preterm Abortions TAB SAB Ect Mult Living                  Review of Systems  Constitutional: Negative for chills.  HENT: Negative.   Eyes: Negative.   Respiratory: Negative for cough.   Cardiovascular: Negative.   Gastrointestinal: Positive for nausea, vomiting and diarrhea. Negative for dysphagia, constipation and bloating.  Genitourinary: Negative.   Musculoskeletal: Negative for myalgias, back pain and arthralgias.  Skin: Negative for itching.  Neurological: Negative.   Hematological: Negative.   Psychiatric/Behavioral: Negative.     Allergies  Amoxicillin  Home Medications   Current Outpatient Rx  Name Route Sig Dispense Refill  . AMLODIPINE BESYLATE 5 MG PO TABS Oral Take 5 mg by mouth daily.    . INSULIN LISPRO PROT & LISPRO (75-25) 100 UNIT/ML [place] SUSP Subcutaneous Inject 20-40 Units into the skin daily with breakfast. 20 units in the morning and 40 units at dinner    .  METFORMIN HCL 500 MG PO TABS Oral Take 500 mg by mouth 2 (two) times daily with a meal.      BP 151/84  Pulse 132  Temp(Src) 99.9 F (37.7 C) (Oral)  Resp 20  SpO2 100%  LMP 03/01/2012  Physical Exam  Constitutional: She is oriented to person, place, and time. She appears well-developed and well-nourished.  HENT:  Head: Normocephalic and atraumatic.  Mouth/Throat: Oropharynx is clear and moist.  Eyes: Conjunctivae are normal. Pupils are equal, round, and reactive to light.  Neck: Normal range of motion. Neck  supple.  Cardiovascular: Regular rhythm.  Tachycardia present.   Pulmonary/Chest: Effort normal and breath sounds normal. She has no wheezes. She has no rales.  Abdominal: Soft. Bowel sounds are normal. She exhibits no distension. There is no rebound and no guarding.  Musculoskeletal: Normal range of motion. She exhibits no edema.  Neurological: She is alert and oriented to person, place, and time.  Skin: Skin is warm and dry.    ED Course  Procedures (including critical care time)  Labs Reviewed  GLUCOSE, CAPILLARY - Abnormal; Notable for the following:    Glucose-Capillary 338 (*)    All other components within normal limits  CBC  DIFFERENTIAL  URINALYSIS, ROUTINE W REFLEX MICROSCOPIC  URINE CULTURE   No results found.   No diagnosis found.    MDM  Seen by hospitalist for admission       Thompson Mckim K Ruweyda Macknight-Rasch, MD 03/19/12 [phone]

## 2012-03-19 NOTE — ED Notes (Signed)
Pt notified that we need a urine sample.  St's she doesn't have to go right now but will let us know when she has to go.

## 2012-03-19 NOTE — ED Notes (Signed)
Pt states she has used pepto bismol and imodium at home without relief  Pt states her diarrhea is like running water

## 2012-03-20 LAB — URINE CULTURE
Colony Count: 50000
Culture  Setup Time: 201303280906

## 2012-06-23 ENCOUNTER — Emergency Department (HOSPITAL_COMMUNITY)
Admission: EM | Admit: 2012-06-23 | Discharge: 2012-06-23 | Disposition: A | Discharge status: Home / Self Care | Payer: Managed Care, Other (non HMO) | Source: Home / Self Care

## 2012-06-23 ENCOUNTER — Encounter (HOSPITAL_COMMUNITY): Payer: Self-pay | Admitting: Emergency Medicine

## 2012-06-23 DIAGNOSIS — M5430 Sciatica, unspecified side: Secondary | ICD-10-CM

## 2012-06-23 DIAGNOSIS — R209 Unspecified disturbances of skin sensation: Secondary | ICD-10-CM

## 2012-06-23 DIAGNOSIS — M79606 Pain in leg, unspecified: Secondary | ICD-10-CM

## 2012-06-23 DIAGNOSIS — M79609 Pain in unspecified limb: Secondary | ICD-10-CM

## 2012-06-23 DIAGNOSIS — R202 Paresthesia of skin: Secondary | ICD-10-CM

## 2012-06-23 DIAGNOSIS — M5431 Sciatica, right side: Secondary | ICD-10-CM

## 2012-06-23 MED ORDER — NAPROXEN 500 MG PO TABS: 500.0000 mg | ORAL_TABLET | Freq: Two times a day (BID) | ORAL | Status: DC

## 2012-06-23 MED ORDER — PREDNISONE 50 MG PO TABS: ORAL_TABLET | ORAL | Status: AC

## 2012-06-23 NOTE — ED Provider Notes (Signed)
History     CSN: [id number]  Arrival date & time 06/23/12  1802   None     Chief Complaint  Patient presents with  . Leg Pain    (Consider location/radiation/quality/duration/timing/severity/associated sxs/prior treatment) Patient is a 41 y.o. female presenting with leg pain. The history is provided by the patient.  Leg Pain    complains of low back pain x 1 week described as intermittent sharp in nature that began.  States pain subsided with taking Tramadol and Meloxicam previously prescribed by PCP.  States one week after she began to have pain in right hip radiating down posterior right calf causing numbness in right foot for the last two days.  Reports pain is aggravated with standing and walking, no known injury noted, denies lifting.    Remote history of back problems but not the same. Denies urinary symptoms, no incontinent episodes.  Pain is 8/10. No red flags such as fevers, age >15, h/o trauma with bony tenderness, neurological deficits, h/o CA, unexplained weight loss, pain worse at night, pain at rest,  h/o prolonged steroid use or h/o osteopenia.   Past Medical History  Diagnosis Date  . Hypertension     chronic hypertension  . Diabetes mellitus     insulin-dependent  . Abnormal Pap smear of vagina   . History of anemia   . Polyhydramnios   . History of varicella     Childhood Varicell    Past Surgical History  Procedure Date  . Cardiac catheterization 02/29/2008    Normal coronary arteries -- Normal LV (left ventricular) systolic function -- Mild to moderate elevation in left ventricular end-diastolic  pressure secondary to hypertension, diabetes and obesity  . Novasure ablation 01/08/2008  . Cholecystectomy 1995  . Hysteroscopy, d&c, novasure ablation, removal of intrauterine device 01/08/2008    Family History  Problem Relation Age of Onset  . Heart disease Mother   . Diabetes Mother   . Hypertension Father   . Diabetes Father   . Kidney failure Father     . Stroke Father   . Hypertension Paternal Grandmother   . Diabetes Paternal Grandmother   . Stroke Paternal Grandmother   . Diabetes Maternal Grandmother   . Ovarian cancer Maternal Grandmother   . Diabetes Maternal Grandfather   . Diabetes Paternal Grandfather     History  Substance Use Topics  . Smoking status: Never Smoker   . Smokeless tobacco: Never Used  . Alcohol Use: No    OB History    Grav Para Term Preterm Abortions TAB SAB Ect Mult Living                  Review of Systems  All other systems reviewed and are negative.    Allergies  Amoxicillin  Home Medications   Current Outpatient Rx  Name Route Sig Dispense Refill  . AMLODIPINE BESYLATE 5 MG PO TABS Oral Take 5 mg by mouth daily.    . INSULIN LISPRO PROT & LISPRO (75-25) 100 UNIT/ML [place] SUSP Subcutaneous Inject 20-40 Units into the skin daily with breakfast. 20 units in the morning and 40 units at dinner    . METFORMIN HCL 500 MG PO TABS Oral Take 500 mg by mouth 2 (two) times daily with a meal.    . NAPROXEN 500 MG PO TABS Oral Take 1 tablet (500 mg total) by mouth 2 (two) times daily. 30 tablet 0  . POTASSIUM CHLORIDE ER 10 MEQ PO TBCR Oral Take 2 tablets (  20 mEq total) by mouth 2 (two) times daily. For 2 days 4 tablet 0  . POTASSIUM CHLORIDE ER 8 MEQ PO TBCR Oral Take 1 tablet (8 mEq total) by mouth 2 (two) times daily. 20 tablet 0  . PREDNISONE 50 MG PO TABS  Take one tab daily 5 tablet 0    BP 143/94  Pulse 94  Temp 98 F (36.7 C) (Oral)  Resp 18  SpO2 100%  LMP 06/23/2012  Physical Exam  Nursing note and vitals reviewed. Constitutional: She is oriented to person, place, and time. Vital signs are normal. She appears well-developed and well-nourished. She is active and cooperative.  HENT:  Head: Normocephalic.  Eyes: Conjunctivae are normal. Pupils are equal, round, and reactive to light. No scleral icterus.  Neck: Trachea normal. Neck supple.  Cardiovascular: Normal rate and regular  rhythm.   Pulmonary/Chest: Effort normal and breath sounds normal.  Abdominal: There is no CVA tenderness.  Musculoskeletal:       Right hip: She exhibits tenderness.       Left hip: Normal.       Right knee: Normal.       Left knee: Normal.       Right ankle: Normal.       Left ankle: Normal.       Cervical back: Normal.       Thoracic back: Normal.       Lumbar back: Normal.       Right upper leg: She exhibits tenderness.       Left upper leg: Normal.       Right lower leg: She exhibits tenderness.       Legs:      Right foot: Normal.  Neurological: She is alert and oriented to person, place, and time. She has normal strength and normal reflexes. No cranial nerve deficit or sensory deficit. GCS eye subscore is 4. GCS verbal subscore is 5. GCS motor subscore is 6.  Skin: Skin is warm, dry and intact.  Psychiatric: She has a normal mood and affect. Her speech is normal and behavior is normal. Judgment and thought content normal. Cognition and memory are normal.    ED Course  Procedures (including critical care time)  Labs Reviewed - No data to display No results found.   1. Sciatica of right side   2. Leg pain   3. Paresthesia of right foot       MDM  Take Prednisone as ordered, followed by Naproxen, may use your home Ultram for any additional pain.  Warm soaks may be helpful.  No prolonged standing or walking for one week.  Follow up with PCP for further management.         Johnsie Kindred, NP 06/23/12 2031

## 2012-06-23 NOTE — ED Notes (Signed)
Pt states pain in back of her tigh and radiates all the way down to her foot. Foot goes to numb. Started about two weeks ago. Urgent care at [facility] prescribed medication for pain, tramadol 50 mg, meloxicam 15 mg.  She took both last night but says is not helping.

## 2012-06-25 NOTE — ED Provider Notes (Signed)
Medical screening examination/treatment/procedure(s) were performed by resident physician or non-physician practitioner and as supervising physician I was immediately available for consultation/collaboration.   Batina Dougan DOUGLAS MD.    Robertta Halfhill D Breshae Belcher, MD 06/25/12 1549 

## 2013-02-06 ENCOUNTER — Emergency Department (HOSPITAL_COMMUNITY): Payer: Managed Care, Other (non HMO)

## 2013-02-06 ENCOUNTER — Emergency Department (HOSPITAL_COMMUNITY)
Admission: EM | Admit: 2013-02-06 | Discharge: 2013-02-06 | Disposition: A | Discharge status: Home / Self Care | Payer: Managed Care, Other (non HMO) | Attending: Emergency Medicine | Admitting: Emergency Medicine

## 2013-02-06 ENCOUNTER — Encounter (HOSPITAL_COMMUNITY): Payer: Self-pay | Admitting: Emergency Medicine

## 2013-02-06 DIAGNOSIS — Z79899 Other long term (current) drug therapy: Secondary | ICD-10-CM | POA: Insufficient documentation

## 2013-02-06 DIAGNOSIS — Z8619 Personal history of other infectious and parasitic diseases: Secondary | ICD-10-CM | POA: Insufficient documentation

## 2013-02-06 DIAGNOSIS — Z9889 Other specified postprocedural states: Secondary | ICD-10-CM | POA: Insufficient documentation

## 2013-02-06 DIAGNOSIS — E119 Type 2 diabetes mellitus without complications: Secondary | ICD-10-CM | POA: Insufficient documentation

## 2013-02-06 DIAGNOSIS — M79673 Pain in unspecified foot: Secondary | ICD-10-CM

## 2013-02-06 DIAGNOSIS — Z794 Long term (current) use of insulin: Secondary | ICD-10-CM | POA: Insufficient documentation

## 2013-02-06 DIAGNOSIS — M25579 Pain in unspecified ankle and joints of unspecified foot: Secondary | ICD-10-CM | POA: Insufficient documentation

## 2013-02-06 DIAGNOSIS — I10 Essential (primary) hypertension: Secondary | ICD-10-CM | POA: Insufficient documentation

## 2013-02-06 DIAGNOSIS — Z862 Personal history of diseases of the blood and blood-forming organs and certain disorders involving the immune mechanism: Secondary | ICD-10-CM | POA: Insufficient documentation

## 2013-02-06 DIAGNOSIS — M7989 Other specified soft tissue disorders: Secondary | ICD-10-CM | POA: Insufficient documentation

## 2013-02-06 MED ORDER — TRAMADOL HCL 50 MG PO TABS: 50.0000 mg | ORAL_TABLET | Freq: Four times a day (QID) | ORAL | Status: DC | PRN

## 2013-02-06 MED ORDER — IBUPROFEN 600 MG PO TABS: 600.0000 mg | ORAL_TABLET | Freq: Four times a day (QID) | ORAL | Status: DC | PRN

## 2013-02-06 NOTE — ED Notes (Addendum)
Patient c/o left foot pain and swelling.  No injury noted.  Patient does have h/o diabetes.  Patient denies fevers.  No h/o blood clots.

## 2013-02-06 NOTE — ED Provider Notes (Signed)
History  This chart was scribed for non-physician practitioner, Renne Crigler, PA working with Donnetta Hutching, MD by Magnus Sinning, ED Scribe. This patient was seen in room WTR5/WTR5 and the patient's care was started at 16:29.  CSN: [id number]  Arrival date & time 02/06/13  1559    Chief Complaint  Patient presents with  . Foot Pain    (Consider location/radiation/quality/duration/timing/severity/associated sxs/prior treatment) The history is provided by the patient. No language interpreter was used.   Erica Nguyen is a 42 y.o. female who presents to the Emergency Department complaining of intermittent severe pain of the left foot, onset this morning with associated swelling. The patient states that she woke up this morning with a cramp-like pain to the dorsum of her left foot. She says it progressively worsened and she noticed a large "knot" to her left foot this afternoon.  She states the pain is aggravated with walking and modified with sitting.  She describes current foot pain as burning sensation and denies any recent injuries to her left foot, falls, n/v, redness, or fevers.  Past Medical History  Diagnosis Date  . Hypertension     chronic hypertension  . Diabetes mellitus     insulin-dependent  . Abnormal Pap smear of vagina   . History of anemia   . Polyhydramnios   . History of varicella     Childhood Varicell    Past Surgical History  Procedure Laterality Date  . Cardiac catheterization  02/29/2008    Normal coronary arteries -- Normal LV (left ventricular) systolic function -- Mild to moderate elevation in left ventricular end-diastolic  pressure secondary to hypertension, diabetes and obesity  . Novasure ablation  01/08/2008  . Cholecystectomy  1995  . Hysteroscopy, d&c, novasure ablation, removal of intrauterine device  01/08/2008    Family History  Problem Relation Age of Onset  . Heart disease Mother   . Diabetes Mother   . Hypertension Father   . Diabetes  Father   . Kidney failure Father   . Stroke Father   . Hypertension Paternal Grandmother   . Diabetes Paternal Grandmother   . Stroke Paternal Grandmother   . Diabetes Maternal Grandmother   . Ovarian cancer Maternal Grandmother   . Diabetes Maternal Grandfather   . Diabetes Paternal Grandfather     History  Substance Use Topics  . Smoking status: Never Smoker   . Smokeless tobacco: Never Used  . Alcohol Use: No   Review of Systems  Constitutional: Negative for fever and activity change.  HENT: Negative for neck pain.   Gastrointestinal: Negative for nausea and vomiting.  Musculoskeletal: Positive for arthralgias and gait problem. Negative for back pain and joint swelling.  Skin: Negative for wound.       Negative redness to left foot  Neurological: Negative for weakness and numbness.  All other systems reviewed and are negative.    Allergies  Amoxicillin  Home Medications   Current Outpatient Rx  Name  Route  Sig  Dispense  Refill  . amLODipine (NORVASC) 5 MG tablet   Oral   Take 5 mg by mouth daily.         . insulin lispro protamine-insulin lispro (HUMALOG 75/25) (75-25) 100 UNIT/ML SUSP   Subcutaneous   Inject 20-40 Units into the skin daily with breakfast. 20 units in the morning and 40 units at dinner         . metFORMIN (GLUCOPHAGE) 500 MG tablet   Oral   Take 500 mg  by mouth 2 (two) times daily with a meal.         . naproxen (NAPROSYN) 500 MG tablet   Oral   Take 1 tablet (500 mg total) by mouth 2 (two) times daily.   30 tablet   0   . potassium chloride (K-DUR) 10 MEQ tablet   Oral   Take 2 tablets (20 mEq total) by mouth 2 (two) times daily. For 2 days   4 tablet   0   . potassium chloride (KLOR-CON) 8 MEQ tablet   Oral   Take 1 tablet (8 mEq total) by mouth 2 (two) times daily.   20 tablet   0     BP 131/85  Pulse 98  Temp(Src) 98.1 F (36.7 C) (Oral)  Ht 5\' 5"  (1.651 m)  Wt 222 lb (100.699 kg)  BMI 36.94 kg/m2  SpO2 99%   LMP 01/06/2013  Physical Exam  Nursing note and vitals reviewed. Constitutional: She is oriented to person, place, and time. She appears well-developed and well-nourished. No distress.  HENT:  Head: Normocephalic and atraumatic.  Eyes: Conjunctivae and EOM are normal. Pupils are equal, round, and reactive to light.  Neck: Normal range of motion. Neck supple. No tracheal deviation present.  Cardiovascular: Normal rate.  Exam reveals no decreased pulses.   Pulmonary/Chest: Effort normal. No respiratory distress.  Abdominal: She exhibits no distension.  Musculoskeletal: She exhibits edema and tenderness.  No calf tenderness. 2 cm diameter area of tenderness and swelling with no overlying erythema to the dorsum of the left foot.   Neurological: She is alert and oriented to person, place, and time. No sensory deficit.  Motor, sensation, and vascular distal to the injury is fully intact.   Skin: Skin is warm and dry.  Psychiatric: She has a normal mood and affect. Her behavior is normal.    ED Course  Procedures (including critical care time) DIAGNOSTIC STUDIES: Oxygen Saturation is 99% on room air, normal by my interpretation.    COORDINATION OF CARE: 16:30: Physical exam performed.  Labs Reviewed - No data to display Dg Foot Complete Left  02/06/2013  *RADIOLOGY REPORT*  Clinical Data: Dorsal foot pain.  No injury.  Unable to bear weight.  LEFT FOOT - COMPLETE 3+ VIEW  Comparison: None.  Findings: Minimal first MTP joint osteoarthritis.  Anatomic alignment.  No fracture.  Midfoot appears within normal limits. Small calcaneal spur is present.  Soft tissues appear within normal limits.  IMPRESSION: No acute osseous abnormality.   Original Report Authenticated By: Andreas Newport, M.D.      1. Foot pain     Patient seen and examined. Work-up initiated.  Vital signs reviewed and are as follows: Filed Vitals:   02/06/13 1620  BP: 131/85  Pulse: 98  Temp: 98.1 F (36.7 C)   5:49  PM patient informed of x-ray findings which were reviewed by myself. Ace wrap by nurse. Patient was counseled on RICE protocol and told to rest injury, use ice for no longer than 15 minutes every hour, compress the area, and elevate above the level of their heart as much as possible to reduce swelling.  Questions answered.  Patient verbalized understanding.    Orthopedic followup given if not improved.  Patient counseled on use of narcotic pain medications. Counseled not to combine these medications with others containing tylenol. Urged not to drink alcohol, drive, or perform any other activities that requires focus while taking these medications. The patient verbalizes understanding and agrees with  the plan.     MDM  Patient with soft tissue or joint swelling or foot. No signs of cellulitis. Suspect inflammatory process. X-rays negative for fracture. Will treat with pain medication and NSAIDs. Patient counseled on rice protocol. Orthopedic followup given in case patient is not improving in 1 week.   I personally performed the services described in this documentation, which was scribed in my presence. The recorded information has been reviewed and is accurate.         Renne Crigler, [place] 02/06/13 [phone]

## 2013-02-06 NOTE — ED Provider Notes (Signed)
Medical screening examination/treatment/procedure(s) were performed by non-physician practitioner and as supervising physician I was immediately available for consultation/collaboration.  Shawnee Higham, MD 02/06/13 2223 

## 2013-09-17 ENCOUNTER — Encounter (HOSPITAL_COMMUNITY): Payer: Self-pay | Admitting: *Deleted

## 2013-09-17 ENCOUNTER — Inpatient Hospital Stay (HOSPITAL_COMMUNITY)
Admission: AD | Admit: 2013-09-17 | Discharge: 2013-09-17 | Disposition: A | Discharge status: Home / Self Care | Payer: Managed Care, Other (non HMO) | Source: Ambulatory Visit | Attending: Obstetrics & Gynecology | Admitting: Obstetrics & Gynecology

## 2013-09-17 DIAGNOSIS — D259 Leiomyoma of uterus, unspecified: Secondary | ICD-10-CM | POA: Insufficient documentation

## 2013-09-17 DIAGNOSIS — B9689 Other specified bacterial agents as the cause of diseases classified elsewhere: Secondary | ICD-10-CM | POA: Insufficient documentation

## 2013-09-17 DIAGNOSIS — A499 Bacterial infection, unspecified: Secondary | ICD-10-CM | POA: Insufficient documentation

## 2013-09-17 DIAGNOSIS — R1032 Left lower quadrant pain: Secondary | ICD-10-CM | POA: Insufficient documentation

## 2013-09-17 DIAGNOSIS — N949 Unspecified condition associated with female genital organs and menstrual cycle: Secondary | ICD-10-CM | POA: Insufficient documentation

## 2013-09-17 DIAGNOSIS — N760 Acute vaginitis: Secondary | ICD-10-CM | POA: Insufficient documentation

## 2013-09-17 HISTORY — DX: Benign neoplasm of connective and other soft tissue, unspecified: D21.9

## 2013-09-17 LAB — WET PREP, GENITAL
Trich, Wet Prep: NONE SEEN
Yeast Wet Prep HPF POC: NONE SEEN

## 2013-09-17 LAB — URINALYSIS, ROUTINE W REFLEX MICROSCOPIC
Bilirubin Urine: NEGATIVE
Glucose, UA: >1000 mg/dL — AB
Hgb urine dipstick: NEGATIVE
Specific Gravity, Urine: 1.02 (ref 1.005–1.030)

## 2013-09-17 LAB — URINE MICROSCOPIC-ADD ON

## 2013-09-17 MED ORDER — METRONIDAZOLE 500 MG PO TABS: 500.0000 mg | ORAL_TABLET | Freq: Two times a day (BID) | ORAL | Status: DC

## 2013-09-17 MED ORDER — IBUPROFEN 800 MG PO TABS: 800.0000 mg | ORAL_TABLET | Freq: Three times a day (TID) | ORAL | Status: DC

## 2013-09-17 NOTE — MAU Provider Note (Signed)
Attestation of Attending Supervision of Advanced Practitioner (CNM/NP): Evaluation and management procedures were performed by the Advanced Practitioner under my supervision and collaboration.  I have reviewed the Advanced Practitioner's note and chart, and I agree with the management and plan.  HARRAWAY-SMITH, Paitynn Mikus 7:12 PM

## 2013-09-17 NOTE — MAU Note (Signed)
Patient states she has a history of recurrent bacterial vaginosis and has a discharge. States she started having left lower abdominal pain about 2 days ago that did not respond to Tylenol. Today became sharp, worse with moving. Got nauseated and hot all over. Denies bleeding at this time. Has a history of fibroids.

## 2013-09-17 NOTE — MAU Provider Note (Signed)
History     CSN: [id number]  Arrival date and time: 09/17/13 1750   First Provider Initiated Contact with Patient 09/17/13 1834      Chief Complaint  Patient presents with  . Vaginal Discharge  . Abdominal Pain   HPI [name] is a 42 y.o. [id number] who presents to MAU today with LLQ pain x 1 week that has become worse today. The patient states that she has had some nausea without vomiting, diarrhea or constipation. She is having a thin, white, malodorous discharge. She is sexually active and has a [employer] fast streamer. She does not use condoms. She has one partner. She denies vaginal bleeding, fever or UTI symptoms. She has known uterine fibroids. 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   OB History   Grav Para Term Preterm Abortions TAB SAB Ect Mult Living   3 1 1  2  2   1       Past Medical History  Diagnosis Date  . Hypertension     chronic hypertension  . Diabetes mellitus     insulin-dependent  . Abnormal Pap smear of vagina   . History of anemia   . Polyhydramnios   . History of varicella     Childhood Varicell  . Fibroid     Past Surgical History  Procedure Laterality Date  . Cardiac catheterization  02/29/2008    Normal coronary arteries -- Normal LV (left ventricular) systolic function -- Mild to moderate elevation in left ventricular end-diastolic  pressure secondary to hypertension, diabetes and obesity  . Novasure ablation  01/08/2008  . Cholecystectomy  1995  . Hysteroscopy, d&c, novasure ablation, removal of intrauterine device  01/08/2008    Family History  Problem Relation Age of Onset  . Heart disease Mother   . Diabetes Mother   . Hypertension Father   . Diabetes Father   . Kidney failure Father   . Stroke Father   . Hypertension Paternal Grandmother   . Diabetes Paternal Grandmother   . Stroke Paternal Grandmother   . Diabetes Maternal Grandmother   . Ovarian cancer Maternal Grandmother   . Diabetes Maternal Grandfather   . Diabetes  Paternal Grandfather     History  Substance Use Topics  . Smoking status: Never Smoker   . Smokeless tobacco: Never Used  . Alcohol Use: No    Allergies:  Allergies  Allergen Reactions  . Amoxicillin     Give pt yeast infection    Prescriptions prior to admission  Medication Sig Dispense Refill  . acetaminophen (TYLENOL) 500 MG tablet Take 500 mg by mouth daily as needed for pain.      Marland Kitchen amLODipine (NORVASC) 5 MG tablet Take 5 mg by mouth daily.      . insulin glargine (LANTUS) 100 units/mL SOLN Inject 20 Units into the skin every morning.      . insulin glargine (LANTUS) 100 units/mL SOLN Inject 50-80 Units into the skin every evening. Pt bases does on Glucose readings      . levonorgestrel (MIRENA) 20 MCG/24HR IUD 1 each by Intrauterine route once.        Review of Systems  Constitutional: Negative for fever and malaise/fatigue.  Gastrointestinal: Positive for nausea and abdominal pain. Negative for vomiting, diarrhea and constipation.  Genitourinary: Negative for dysuria, urgency and frequency.       Neg - vaginal bleeding + vaginal discharge   Physical Exam   Blood pressure 144/83, pulse 82, temperature 98.1 F (  36.7 C), temperature source Oral, resp. rate 16, height 5\' 5"  (1.651 m), weight 241 lb (109.317 kg), last menstrual period 08/27/2013, SpO2 100.00%.  Physical Exam  Constitutional: She is oriented to person, place, and time. She appears well-developed and well-nourished. No distress.  HENT:  Head: Normocephalic and atraumatic.  Cardiovascular: Normal rate, regular rhythm and normal heart sounds.   Respiratory: Effort normal and breath sounds normal. No respiratory distress.  GI: Soft. Bowel sounds are normal. She exhibits no distension and no mass. There is tenderness (moderate tenderness to palpation of the LLQ ). There is no rebound and no guarding.  Genitourinary: Uterus is not enlarged (exam limited by body habitus) and not tender. Cervix exhibits no  motion tenderness, no discharge and no friability. Right adnexum displays no mass and no tenderness. Left adnexum displays no mass and no tenderness. No bleeding around the vagina. Vaginal discharge (small amount of thin, white, frothy discharge noted with foul odor) found.  Neurological: She is alert and oriented to person, place, and time.  Skin: Skin is warm and dry. No erythema.  Psychiatric: She has a normal mood and affect.   Results for orders placed during the hospital encounter of 09/17/13 (from the past 24 hour(s))  POCT PREGNANCY, URINE     Status: None   Collection Time    09/17/13  6:28 PM      Result Value Range   Preg Test, Ur NEGATIVE  NEGATIVE  WET PREP, GENITAL     Status: Abnormal   Collection Time    09/17/13  6:43 PM      Result Value Range   Yeast Wet Prep HPF POC NONE SEEN  NONE SEEN   Trich, Wet Prep NONE SEEN  NONE SEEN   Clue Cells Wet Prep HPF POC FEW (*) NONE SEEN   WBC, Wet Prep HPF POC MANY (*) NONE SEEN    MAU Course  Procedures None  MDM UPT - Negative UA, Wet prep, GC/Chlamydia  Assessment and Plan  A: Uterine Fibroids Bacterial vaginosis  P: Discharge home Rx for Flagyl and Ibuprofen sent to patient's pharmacy Patient advised to make an appointment for follow-up with [facility] if symptoms persist or worsen Patient may return to MAU as needed or if her condition were to change or worsen  Freddi Starr, PA-C  09/17/2013, 7:00 PM

## 2013-09-18 LAB — GC/CHLAMYDIA PROBE AMP
CT Probe RNA: NEGATIVE
GC Probe RNA: NEGATIVE

## 2013-10-01 ENCOUNTER — Encounter (HOSPITAL_COMMUNITY): Payer: Self-pay | Admitting: *Deleted

## 2013-10-01 ENCOUNTER — Inpatient Hospital Stay (HOSPITAL_COMMUNITY)
Admission: AD | Admit: 2013-10-01 | Discharge: 2013-10-01 | Disposition: A | Discharge status: Home / Self Care | Payer: Managed Care, Other (non HMO) | Source: Ambulatory Visit | Attending: Obstetrics & Gynecology | Admitting: Obstetrics & Gynecology

## 2013-10-01 DIAGNOSIS — L293 Anogenital pruritus, unspecified: Secondary | ICD-10-CM | POA: Insufficient documentation

## 2013-10-01 DIAGNOSIS — B3731 Acute candidiasis of vulva and vagina: Secondary | ICD-10-CM | POA: Insufficient documentation

## 2013-10-01 DIAGNOSIS — B372 Candidiasis of skin and nail: Secondary | ICD-10-CM

## 2013-10-01 DIAGNOSIS — B373 Candidiasis of vulva and vagina: Secondary | ICD-10-CM | POA: Insufficient documentation

## 2013-10-01 LAB — WET PREP, GENITAL
Trich, Wet Prep: NONE SEEN
Yeast Wet Prep HPF POC: NONE SEEN

## 2013-10-01 LAB — URINALYSIS, ROUTINE W REFLEX MICROSCOPIC
Glucose, UA: 500 mg/dL — AB
Nitrite: NEGATIVE
Protein, ur: NEGATIVE mg/dL
Specific Gravity, Urine: >1.03 — ABNORMAL HIGH (ref 1.005–1.030)
pH: 6 (ref 5.0–8.0)

## 2013-10-01 LAB — POCT PREGNANCY, URINE: Preg Test, Ur: NEGATIVE

## 2013-10-01 LAB — URINE MICROSCOPIC-ADD ON

## 2013-10-01 MED ORDER — FLUCONAZOLE 150 MG PO TABS
150.0000 mg | ORAL_TABLET | Freq: Once | ORAL | Status: AC
Start: 2013-10-01 — End: 2013-10-01
  Administered 2013-10-01: 150 mg via ORAL
  Filled 2013-10-01: qty 1

## 2013-10-01 MED ORDER — NYSTATIN 100000 UNIT/GM EX CREA: TOPICAL_CREAM | CUTANEOUS | Status: DC

## 2013-10-01 MED ORDER — TRIAMCINOLONE ACETONIDE 0.1 % EX CREA: TOPICAL_CREAM | Freq: Two times a day (BID) | CUTANEOUS | Status: DC

## 2013-10-01 NOTE — MAU Provider Note (Signed)
History     CSN: [id number]  Arrival date and time: 10/01/13 2130   First Provider Initiated Contact with Patient 10/01/13 1953      Chief Complaint  Patient presents with  . Vaginitis   HPI [name] is a 42 y.o. [id number] who presents to MAU today with complaint of vaginal itching and irritation. The patient was seen in MAU on 09/17/13 and given Flagyl for BV. She states that she first noted the itching and irritation last night. She is having some burning externally with urination, but denies UTI symptoms otherwise. She has started her period today. She denies fever or pelvic pain.   OB History   Grav Para Term Preterm Abortions TAB SAB Ect Mult Living   3 1 1  2  2   1       Past Medical History  Diagnosis Date  . Hypertension     chronic hypertension  . Diabetes mellitus     insulin-dependent  . Abnormal Pap smear of vagina   . History of anemia   . Polyhydramnios   . History of varicella     Childhood Varicell  . Fibroid     Past Surgical History  Procedure Laterality Date  . Cardiac catheterization  02/29/2008    Normal coronary arteries -- Normal LV (left ventricular) systolic function -- Mild to moderate elevation in left ventricular end-diastolic  pressure secondary to hypertension, diabetes and obesity  . Novasure ablation  01/08/2008  . Cholecystectomy  1995  . Hysteroscopy, d&c, novasure ablation, removal of intrauterine device  01/08/2008    Family History  Problem Relation Age of Onset  . Heart disease Mother   . Diabetes Mother   . Hypertension Father   . Diabetes Father   . Kidney failure Father   . Stroke Father   . Hypertension Paternal Grandmother   . Diabetes Paternal Grandmother   . Stroke Paternal Grandmother   . Diabetes Maternal Grandmother   . Ovarian cancer Maternal Grandmother   . Diabetes Maternal Grandfather   . Diabetes Paternal Grandfather     History  Substance Use Topics  . Smoking status: Never Smoker   .  Smokeless tobacco: Never Used  . Alcohol Use: No    Allergies:  Allergies  Allergen Reactions  . Amoxicillin     Give pt yeast infection    Prescriptions prior to admission  Medication Sig Dispense Refill  . acetaminophen (TYLENOL) 500 MG tablet Take 500 mg by mouth daily as needed for pain.      Marland Kitchen amLODipine (NORVASC) 5 MG tablet Take 5 mg by mouth daily.      Marland Kitchen ibuprofen (ADVIL,MOTRIN) 800 MG tablet Take 1 tablet (800 mg total) by mouth 3 (three) times daily.  21 tablet  0  . insulin glargine (LANTUS) 100 units/mL SOLN Inject 20 Units into the skin every morning.      . insulin glargine (LANTUS) 100 units/mL SOLN Inject 50-80 Units into the skin every evening. Pt bases does on Glucose readings      . levonorgestrel (MIRENA) 20 MCG/24HR IUD 1 each by Intrauterine route once.      . [DISCONTINUED] metroNIDAZOLE (FLAGYL) 500 MG tablet Take 1 tablet (500 mg total) by mouth 2 (two) times daily.  14 tablet  0    Review of Systems  Constitutional: Negative for fever and malaise/fatigue.  Gastrointestinal: Negative for abdominal pain.  Genitourinary: Negative for dysuria, urgency and frequency.       +  vaginal bleeding Neg - vaginal discharge   Physical Exam   Blood pressure 165/91, pulse 86, temperature 98.5 F (36.9 C), temperature source Oral, resp. rate 18, height 5\' 5"  (1.651 m), weight 240 lb (108.863 kg), last menstrual period 08/27/2013.  Physical Exam  Constitutional: She is oriented to person, place, and time. She appears well-developed and well-nourished. No distress.  HENT:  Head: Normocephalic and atraumatic.  Cardiovascular: Normal rate.   Respiratory: Effort normal.  GI: She exhibits no distension and no mass. There is no tenderness. There is no rebound and no guarding.  Genitourinary: Uterus is not enlarged and not tender. Cervix exhibits no motion tenderness, no discharge and no friability. Right adnexum displays no mass and no tenderness. Left adnexum displays no  mass and no tenderness. There is bleeding (scant dark brown blood noted in the vagina) around the vagina. No vaginal discharge found.  Neurological: She is alert and oriented to person, place, and time.  Skin: Skin is warm and dry. No erythema.  Psychiatric: She has a normal mood and affect.   Results for orders placed during the hospital encounter of 10/01/13 (from the past 24 hour(s))  URINALYSIS, ROUTINE W REFLEX MICROSCOPIC     Status: Abnormal   Collection Time    10/01/13  7:25 PM      Result Value Range   Color, Urine YELLOW  YELLOW   APPearance CLEAR  CLEAR   Specific Gravity, Urine >1.030 (*) 1.005 - 1.030   pH 6.0  5.0 - 8.0   Glucose, UA 500 (*) NEGATIVE mg/dL   Hgb urine dipstick LARGE (*) NEGATIVE   Bilirubin Urine NEGATIVE  NEGATIVE   Ketones, ur NEGATIVE  NEGATIVE mg/dL   Protein, ur NEGATIVE  NEGATIVE mg/dL   Urobilinogen, UA 0.2  0.0 - 1.0 mg/dL   Nitrite NEGATIVE  NEGATIVE   Leukocytes, UA TRACE (*) NEGATIVE  URINE MICROSCOPIC-ADD ON     Status: Abnormal   Collection Time    10/01/13  7:25 PM      Result Value Range   Squamous Epithelial / LPF FEW (*) RARE   WBC, UA 3-6  <3 WBC/hpf   RBC / HPF 7-10  <3 RBC/hpf   Bacteria, UA FEW (*) RARE  POCT PREGNANCY, URINE     Status: None   Collection Time    10/01/13  7:46 PM      Result Value Range   Preg Test, Ur NEGATIVE  NEGATIVE  WET PREP, GENITAL     Status: Abnormal   Collection Time    10/01/13  8:03 PM      Result Value Range   Yeast Wet Prep HPF POC NONE SEEN  NONE SEEN   Trich, Wet Prep NONE SEEN  NONE SEEN   Clue Cells Wet Prep HPF POC FEW (*) NONE SEEN   WBC, Wet Prep HPF POC MANY (*) NONE SEEN    MAU Course  Procedures None  MDM UPT - negative UA, wet prep today 150 mg Diflucan given in MAU Assessment and Plan  A: Cutaneous yeast  P: Discharge home Rx for Nystatin and Triamcinolone creams sent to patient's pharmacy Patient advised that these are for external use only Patient given  contact information for [facility] clinic to make an appointment to establish care Patient may return to MAU as needed or if her condition were to change or worsen  Freddi Starr, PA-C  10/01/2013, 8:27 PM

## 2013-10-01 NOTE — MAU Note (Signed)
Patient states she has a yeast infection and was unable to get into her doctors office today. Monistat does not work for her and she wants diflucan.

## 2013-10-03 LAB — URINE CULTURE: Colony Count: 40000

## 2013-10-06 ENCOUNTER — Encounter (HOSPITAL_COMMUNITY): Payer: Self-pay

## 2013-10-06 ENCOUNTER — Inpatient Hospital Stay (HOSPITAL_COMMUNITY)
Admission: AD | Admit: 2013-10-06 | Discharge: 2013-10-07 | Disposition: A | Discharge status: Home / Self Care | Payer: Managed Care, Other (non HMO) | Source: Ambulatory Visit | Attending: Obstetrics & Gynecology | Admitting: Obstetrics & Gynecology

## 2013-10-06 ENCOUNTER — Inpatient Hospital Stay (HOSPITAL_COMMUNITY): Payer: Managed Care, Other (non HMO)

## 2013-10-06 DIAGNOSIS — N83209 Unspecified ovarian cyst, unspecified side: Secondary | ICD-10-CM | POA: Insufficient documentation

## 2013-10-06 DIAGNOSIS — R1032 Left lower quadrant pain: Secondary | ICD-10-CM

## 2013-10-06 DIAGNOSIS — N926 Irregular menstruation, unspecified: Secondary | ICD-10-CM | POA: Insufficient documentation

## 2013-10-06 HISTORY — DX: Unspecified abnormal cytological findings in specimens from cervix uteri: R87.619

## 2013-10-06 HISTORY — DX: Reserved for concepts with insufficient information to code with codable children: IMO0002

## 2013-10-06 LAB — CBC
HCT: 35.5 % — ABNORMAL LOW (ref 36.0–46.0)
Hemoglobin: 12.4 g/dL (ref 12.0–15.0)
MCH: 28.8 pg (ref 26.0–34.0)
MCHC: 34.9 g/dL (ref 30.0–36.0)
Platelets: 258 10*3/uL (ref 150–400)
RBC: 4.3 MIL/uL (ref 3.87–5.11)
WBC: 13.1 10*3/uL — ABNORMAL HIGH (ref 4.0–10.5)

## 2013-10-06 LAB — URINALYSIS, ROUTINE W REFLEX MICROSCOPIC
Bilirubin Urine: NEGATIVE
Glucose, UA: 500 mg/dL — AB
Ketones, ur: NEGATIVE mg/dL
Leukocytes, UA: NEGATIVE
Nitrite: NEGATIVE
Protein, ur: NEGATIVE mg/dL
Urobilinogen, UA: 0.2 mg/dL (ref 0.0–1.0)
pH: 6 (ref 5.0–8.0)

## 2013-10-06 LAB — URINE MICROSCOPIC-ADD ON

## 2013-10-06 MED ORDER — KETOROLAC TROMETHAMINE 60 MG/2ML IM SOLN
60.0000 mg | Freq: Once | INTRAMUSCULAR | Status: AC
  Administered 2013-10-06: 60 mg via INTRAMUSCULAR
  Filled 2013-10-06: qty 2

## 2013-10-06 NOTE — MAU Note (Signed)
Pt reports she was seen here 2 weeks ago for left side pain , ? Cyst.  Pain did not go away and tonight pain worsened. Nausea, vomited x 1.

## 2013-10-07 DIAGNOSIS — R1032 Left lower quadrant pain: Secondary | ICD-10-CM

## 2013-10-07 MED ORDER — TRAMADOL HCL 50 MG PO TABS: 50.0000 mg | ORAL_TABLET | Freq: Four times a day (QID) | ORAL | Status: DC | PRN

## 2013-10-07 NOTE — MAU Provider Note (Signed)
Chief Complaint: Abdominal Pain   First Provider Initiated Contact with Patient 10/07/13 0038     SUBJECTIVE HPI: Erica Nguyen is a 42 y.o. [id number] female who presents with left lower quadrant pain x2 weeks that suddenly worsened tonight. Felt a sharp stab of pain that made her vomit x one. Mild nausea persists. Seen in maternity admissions 2 weeks ago. Told she might have a cyst. No imaging done. Pain adequately controlled with ibuprofen, but has not taken any since the pain worsens. Describes pain as constant, 10/10 on pain scale. Has Mirena in place. LMP 10/02/2013, but has light, irregular periods with Mirena in place. No changes in bleeding pattern since pain started.  GC Chlamydia negative 09/17/2013. Declines repeat.  Past Medical History  Diagnosis Date  . Hypertension     chronic hypertension  . Diabetes mellitus     insulin-dependent  . Abnormal Pap smear of vagina   . History of anemia   . Polyhydramnios   . History of varicella     Childhood Varicell  . Fibroid   . Abnormal Pap smear    OB History  Gravida Para Term Preterm AB SAB TAB Ectopic Multiple Living  3 1 1  2 2    1     # Outcome Date GA Lbr Len/2nd Weight Sex Delivery Anes PTL Lv  3 SAB           2 SAB           1 TRM      SVD   Y     Past Surgical History  Procedure Laterality Date  . Cardiac catheterization  02/29/2008    Normal coronary arteries -- Normal LV (left ventricular) systolic function -- Mild to moderate elevation in left ventricular end-diastolic  pressure secondary to hypertension, diabetes and obesity  . Novasure ablation  01/08/2008  . Cholecystectomy  1995  . Hysteroscopy, d&c, novasure ablation, removal of intrauterine device  01/08/2008  . Gynecologic cryosurgery  1998   History   Social History  . Marital Status: Single    Spouse Name: N/A    Number of Children: N/A  . Years of Education: N/A   Occupational History  . Not on file.   Social History Main Topics  . Smoking  status: Never Smoker   . Smokeless tobacco: Never Used  . Alcohol Use: No  . Drug Use: No  . Sexual Activity: Yes    Birth Control/ Protection: IUD     Comment: x 1 partner   Other Topics Concern  . Not on file   Social History Narrative  . No narrative on file   No current facility-administered medications on file prior to encounter.   Current Outpatient Prescriptions on File Prior to Encounter  Medication Sig Dispense Refill  . acetaminophen (TYLENOL) 500 MG tablet Take 500 mg by mouth daily as needed for pain.      Marland Kitchen amLODipine (NORVASC) 5 MG tablet Take 5 mg by mouth daily.      Marland Kitchen ibuprofen (ADVIL,MOTRIN) 800 MG tablet Take 1 tablet (800 mg total) by mouth 3 (three) times daily.  21 tablet  0  . insulin glargine (LANTUS) 100 units/mL SOLN Inject 20 Units into the skin every morning.      . insulin glargine (LANTUS) 100 units/mL SOLN Inject 50-80 Units into the skin every evening. Pt bases does on Glucose readings      . nystatin cream (MYCOSTATIN) Apply to affected area 2 times daily  15 g  0  . triamcinolone cream (KENALOG) 0.1 % Apply topically 2 (two) times daily.  30 g  0  . levonorgestrel (MIRENA) 20 MCG/24HR IUD 1 each by Intrauterine route once.       Allergies  Allergen Reactions  . Amoxicillin     Give pt yeast infection    ROS: Pertinent positive items in HPI. Negative for fever, chills, vaginal discharge, urinary complaints, flank pain, diarrhea, constipation, dyspareunia.  OBJECTIVE Blood pressure 155/86, pulse 84, temperature 98.4 F (36.9 C), temperature source Oral, resp. rate 20, height 5' 5.5" (1.664 m), weight 108.863 kg (240 lb), last menstrual period 10/02/2013, SpO2 100.00%. GENERAL: Well-developed, well-nourished female in mild distress.  HEENT: Normocephalic HEART: normal rate RESP: normal effort ABDOMEN: Soft, moderate low abdominal tenderness, left greater than right. Positive bowel sounds x4. No CVA tenderness. NEURO: Alert and  oriented SPECULUM EXAM: NEFG, physiologic discharge, no blood noted, cervix clean. Strings visible. End of IUD not visible. BIMANUAL: cervix closed; unable to assess uterus size due to body habitus, no adnexal tenderness or masses. No cervical motion tenderness. Strings palpated. End of IUD not felt.  LAB RESULTS Results for orders placed during the hospital encounter of 10/06/13 (from the past 24 hour(s))  URINALYSIS, ROUTINE W REFLEX MICROSCOPIC     Status: Abnormal   Collection Time    10/06/13 10:38 PM      Result Value Range   Color, Urine YELLOW  YELLOW   APPearance CLEAR  CLEAR   Specific Gravity, Urine 1.025  1.005 - 1.030   pH 6.0  5.0 - 8.0   Glucose, UA 500 (*) NEGATIVE mg/dL   Hgb urine dipstick MODERATE (*) NEGATIVE   Bilirubin Urine NEGATIVE  NEGATIVE   Ketones, ur NEGATIVE  NEGATIVE mg/dL   Protein, ur NEGATIVE  NEGATIVE mg/dL   Urobilinogen, UA 0.2  0.0 - 1.0 mg/dL   Nitrite NEGATIVE  NEGATIVE   Leukocytes, UA NEGATIVE  NEGATIVE  URINE MICROSCOPIC-ADD ON     Status: Abnormal   Collection Time    10/06/13 10:38 PM      Result Value Range   Squamous Epithelial / LPF FEW (*) RARE   WBC, UA 0-2  <3 WBC/hpf   RBC / HPF 3-6  <3 RBC/hpf   Bacteria, UA FEW (*) RARE  POCT PREGNANCY, URINE     Status: None   Collection Time    10/06/13 11:03 PM      Result Value Range   Preg Test, Ur NEGATIVE  NEGATIVE  CBC     Status: Abnormal   Collection Time    10/06/13 11:30 PM      Result Value Range   WBC 13.1 (*) 4.0 - 10.5 K/uL   RBC 4.30  3.87 - 5.11 MIL/uL   Hemoglobin 12.4  12.0 - 15.0 g/dL   HCT 69.6 (*) 29.5 - 28.4 %   MCV 82.6  78.0 - 100.0 fL   MCH 28.8  26.0 - 34.0 pg   MCHC 34.9  30.0 - 36.0 g/dL   RDW 13.2  44.0 - 10.2 %   Platelets 258  150 - 400 K/uL    IMAGING US Transvaginal Non-ob  10/07/2013   CLINICAL DATA:  Left lower quadrant pain. History of ovarian cyst.  EXAM: TRANSABDOMINAL AND TRANSVAGINAL ULTRASOUND OF PELVIS  TECHNIQUE: Both transabdominal  and transvaginal ultrasound examinations of the pelvis were performed. Transabdominal technique was performed for global imaging of the pelvis including uterus, ovaries, adnexal regions, and  pelvic cul-de-sac. It was necessary to proceed with endovaginal exam following the transabdominal exam to visualize the adnexa and endometrium.  COMPARISON:  12/14/2006 obstetric ultrasound.  FINDINGS: Uterus  Measurements: 11 x 8 x 9 cm. There is a heterogeneous, partly shadowing mass in the upper, likely anterior, myometrium measuring at least 7 cm, correlating with previously seen large fibroid.  Endometrium  Thickness: 5 mm.  No focal abnormality visualized.  Right ovary  Measurements: 2.3 x 2.3 by 1.8 cm. Normal appearance/no adnexal mass.  Left ovary  Measurements: 2.4 x 2.3 x 2.7 cm. Normal appearance.  Other findings  No significant free fluid.  IMPRESSION: 1. Large uterine fibroid, at least 7 cm. 2. Normal appearing ovaries.   Electronically Signed   By: Tiburcio Pea M.D.   On: 10/07/2013 00:35   US Pelvis Complete  10/07/2013   CLINICAL DATA:  Left lower quadrant pain. History of ovarian cyst.  EXAM: TRANSABDOMINAL AND TRANSVAGINAL ULTRASOUND OF PELVIS  TECHNIQUE: Both transabdominal and transvaginal ultrasound examinations of the pelvis were performed. Transabdominal technique was performed for global imaging of the pelvis including uterus, ovaries, adnexal regions, and pelvic cul-de-sac. It was necessary to proceed with endovaginal exam following the transabdominal exam to visualize the adnexa and endometrium.  COMPARISON:  12/14/2006 obstetric ultrasound.  FINDINGS: Uterus  Measurements: 11 x 8 x 9 cm. There is a heterogeneous, partly shadowing mass in the upper, likely anterior, myometrium measuring at least 7 cm, correlating with previously seen large fibroid.  Endometrium  Thickness: 5 mm.  No focal abnormality visualized.  Right ovary  Measurements: 2.3 x 2.3 by 1.8 cm. Normal appearance/no adnexal mass.   Left ovary  Measurements: 2.4 x 2.3 x 2.7 cm. Normal appearance.  Other findings  No significant free fluid.  IMPRESSION: 1. Large uterine fibroid, at least 7 cm. 2. Normal appearing ovaries.   Electronically Signed   By: Tiburcio Pea M.D.   On: 10/07/2013 00:35    MAU COURSE Pain decreased to 7/10 on pain scale after Toradol. Patient states she's feeling much better.  ASSESSMENT 1. LLQ abdominal pain-suspect ruptured ovarian cyst.   Differential diagnoses include something of GI etiology, possible early diverticulitis.  PLAN Discharge home in stable condition per consult with Dr. Despina Hidden.     Follow-up Information   Follow up with your primary Gynecologist. (As needed if symptoms worsen)       Follow up with [facility]. (As needed in emergencies)    Contact information:   [address] [place] [address]        Medication List         acetaminophen 500 MG tablet  Commonly known as:  TYLENOL  Take 500 mg by mouth daily as needed for pain.     amLODipine 5 MG tablet  Commonly known as:  NORVASC  Take 5 mg by mouth daily.     ibuprofen 800 MG tablet  Commonly known as:  ADVIL,MOTRIN  Take 1 tablet (800 mg total) by mouth 3 (three) times daily.     insulin glargine 100 units/mL Soln  Commonly known as:  LANTUS  Inject 20 Units into the skin every morning.     insulin glargine 100 units/mL Soln  Commonly known as:  LANTUS  Inject 50-80 Units into the skin every evening. Pt bases does on Glucose readings     levonorgestrel 20 MCG/24HR IUD  Commonly known as:  MIRENA  1 each by Intrauterine route once.     nystatin  cream  Commonly known as:  MYCOSTATIN  Apply to affected area 2 times daily     traMADol 50 MG tablet  Commonly known as:  ULTRAM  Take 1-2 tablets (50-100 mg total) by mouth every 6 (six) hours as needed for pain.     triamcinolone cream 0.1 %  Commonly known as:  KENALOG  Apply topically 2 (two) times daily.       [place],  CNM 10/07/2013  1:35 AM

## 2013-12-25 LAB — HM DIABETES EYE EXAM

## 2014-01-04 ENCOUNTER — Encounter: Payer: Self-pay | Admitting: Internal Medicine

## 2014-02-12 ENCOUNTER — Encounter (HOSPITAL_COMMUNITY): Payer: Self-pay

## 2014-02-12 ENCOUNTER — Inpatient Hospital Stay (HOSPITAL_COMMUNITY)
Admission: AD | Admit: 2014-02-12 | Discharge: 2014-02-12 | Disposition: A | Discharge status: Home / Self Care | Payer: Managed Care, Other (non HMO) | Source: Ambulatory Visit | Attending: Obstetrics and Gynecology | Admitting: Obstetrics and Gynecology

## 2014-02-12 DIAGNOSIS — B373 Candidiasis of vulva and vagina: Secondary | ICD-10-CM

## 2014-02-12 DIAGNOSIS — I10 Essential (primary) hypertension: Secondary | ICD-10-CM

## 2014-02-12 DIAGNOSIS — B3731 Acute candidiasis of vulva and vagina: Secondary | ICD-10-CM | POA: Insufficient documentation

## 2014-02-12 DIAGNOSIS — L293 Anogenital pruritus, unspecified: Secondary | ICD-10-CM | POA: Insufficient documentation

## 2014-02-12 DIAGNOSIS — Z794 Long term (current) use of insulin: Secondary | ICD-10-CM | POA: Insufficient documentation

## 2014-02-12 DIAGNOSIS — E119 Type 2 diabetes mellitus without complications: Secondary | ICD-10-CM | POA: Insufficient documentation

## 2014-02-12 LAB — URINALYSIS, ROUTINE W REFLEX MICROSCOPIC
BILIRUBIN URINE: NEGATIVE
Glucose, UA: 500 mg/dL — AB
Ketones, ur: NEGATIVE mg/dL
Leukocytes, UA: NEGATIVE
NITRITE: NEGATIVE
PH: 5.5 (ref 5.0–8.0)
Protein, ur: NEGATIVE mg/dL
Specific Gravity, Urine: 1.02 (ref 1.005–1.030)
UROBILINOGEN UA: 0.2 mg/dL (ref 0.0–1.0)

## 2014-02-12 LAB — URINE MICROSCOPIC-ADD ON

## 2014-02-12 LAB — WET PREP, GENITAL
Clue Cells Wet Prep HPF POC: NONE SEEN
Trich, Wet Prep: NONE SEEN
Yeast Wet Prep HPF POC: NONE SEEN

## 2014-02-12 LAB — POCT PREGNANCY, URINE: PREG TEST UR: NEGATIVE

## 2014-02-12 MED ORDER — FLUCONAZOLE 150 MG PO TABS: 150.0000 mg | ORAL_TABLET | Freq: Once | ORAL | Status: DC

## 2014-02-12 MED ORDER — NYSTATIN-TRIAMCINOLONE 100000-0.1 UNIT/GM-% EX OINT: 1.0000 "application " | TOPICAL_OINTMENT | Freq: Two times a day (BID) | CUTANEOUS | Status: DC

## 2014-02-12 NOTE — MAU Provider Note (Signed)
CC: Vaginal Itching    First Provider Initiated Contact with Patient 02/12/14 1617      HPI Erica Nguyen is a 43 y.o. [id number] Type 2 diabetic who presents with several day history of vulvar itching and irritation. Had similar sx in October when treated here with Diflucan, Triamcinolone and Nystatin with relief. Gets frequent yeast vulvovaginitis and states glucose is not well controlled. Just had insulin increased last week. LMP 02/05/14. No new soaps or potential irritants.  RN note: States Dr. Rogue Bussing called a prescription for Diflucan into the pharmacy on Thursday. Pt took it and used monistat externally with no relief. States she is currently on her cycle   Past Medical History  Diagnosis Date  . Hypertension     chronic hypertension  . Diabetes mellitus     insulin-dependent  . Abnormal Pap smear of vagina   . History of anemia   . Polyhydramnios   . History of varicella     Childhood Varicell  . Fibroid   . Abnormal Pap smear     OB History  Gravida Para Term Preterm AB SAB TAB Ectopic Multiple Living  3 1 1  2 2    1     # Outcome Date GA Lbr Len/2nd Weight Sex Delivery Anes PTL Lv  3 SAB           2 SAB           1 TRM      SVD   Y      Past Surgical History  Procedure Laterality Date  . Cardiac catheterization  02/29/2008    Normal coronary arteries -- Normal LV (left ventricular) systolic function -- Mild to moderate elevation in left ventricular end-diastolic  pressure secondary to hypertension, diabetes and obesity  . Novasure ablation  01/08/2008  . Cholecystectomy  1995  . Hysteroscopy, d&c, novasure ablation, removal of intrauterine device  01/08/2008  . Gynecologic cryosurgery  1998    History   Social History  . Marital Status: Single    Spouse Name: N/A    Number of Children: N/A  . Years of Education: N/A   Occupational History  . Not on file.   Social History Main Topics  . Smoking status: Never Smoker   . Smokeless tobacco: Never Used   . Alcohol Use: No  . Drug Use: No  . Sexual Activity: Yes    Birth Control/ Protection: IUD     Comment: x 1 partner   Other Topics Concern  . Not on file   Social History Narrative  . No narrative on file    No current facility-administered medications on file prior to encounter.   Current Outpatient Prescriptions on File Prior to Encounter  Medication Sig Dispense Refill  . acetaminophen (TYLENOL) 500 MG tablet Take 500 mg by mouth daily as needed for pain.      Marland Kitchen amLODipine (NORVASC) 5 MG tablet Take 5 mg by mouth daily.      Marland Kitchen ibuprofen (ADVIL,MOTRIN) 800 MG tablet Take 1 tablet (800 mg total) by mouth 3 (three) times daily.  21 tablet  0  . insulin glargine (LANTUS) 100 units/mL SOLN Inject 20 Units into the skin every morning.      . insulin glargine (LANTUS) 100 units/mL SOLN Inject 50-80 Units into the skin every evening. Pt bases does on Glucose readings      . levonorgestrel (MIRENA) 20 MCG/24HR IUD 1 each by Intrauterine route once.      Marland Kitchen  nystatin cream (MYCOSTATIN) Apply to affected area 2 times daily  15 g  0  . traMADol (ULTRAM) 50 MG tablet Take 1-2 tablets (50-100 mg total) by mouth every 6 (six) hours as needed for pain.  30 tablet  0  . triamcinolone cream (KENALOG) 0.1 % Apply topically 2 (two) times daily.  30 g  0    Allergies  Allergen Reactions  . Amoxicillin     Give pt yeast infection    ROS Pertinent items in HPI  PHYSICAL EXAM Filed Vitals:   02/12/14 1616  BP: 156/88  Pulse: 90  Temp: 98 F (36.7 C)  Resp: 18   General: Well nourished, well developed female in no acute distress Cardiovascular: Normal rate Respiratory: Normal effort Abdomen: Soft, nontender Back: No CVAT Extremities: No edema Neurologic: Alert and oriented Speculum exam: External genitalia has concluded pink rash from labia majora to intertriginous region with areas of excoriation and fissures; vagina with thin mucoid discharge, no blood noted; cervix clean Bimanual  exam: No CMT; uterus NT; no adnexal tenderness or masses. IUD strings palpated   LAB RESULTS Results for orders placed during the hospital encounter of 02/12/14 (from the past 24 hour(s))  POCT PREGNANCY, URINE     Status: None   Collection Time    02/12/14  4:12 PM      Result Value Ref Range   Preg Test, Ur NEGATIVE  NEGATIVE  WET PREP, GENITAL     Status: Abnormal   Collection Time    02/12/14  4:30 PM      Result Value Ref Range   Yeast Wet Prep HPF POC NONE SEEN  NONE SEEN   Trich, Wet Prep NONE SEEN  NONE SEEN   Clue Cells Wet Prep HPF POC NONE SEEN  NONE SEEN   WBC, Wet Prep HPF POC FEW (*) NONE SEEN     ASSESSMENT  1. Yeast infection involving the vagina and surrounding area   2. DIABETES MELLITUS, TYPE II   3. HYPERTENSION     PLAN Discharge home. See AVS for patient education.    Medication List         fluconazole 150 MG tablet  Commonly known as:  DIFLUCAN  Take 1 tablet (150 mg total) by mouth once.     insulin glargine 100 units/mL Soln  Commonly known as:  LANTUS  Inject 50 Units into the skin at bedtime.     levonorgestrel 20 MCG/24HR IUD  Commonly known as:  MIRENA  1 each by Intrauterine route once.     lisinopril 5 MG tablet  Commonly known as:  PRINIVIL,ZESTRIL  Take 5 mg by mouth daily.     naproxen sodium 220 MG tablet  Commonly known as:  ANAPROX  Take 220 mg by mouth 2 (two) times daily as needed (pain).     nystatin-triamcinolone ointment  Commonly known as:  MYCOLOG  Apply 1 application topically 2 (two) times daily.       Follow-up Information   Follow up with Allyn Kenner, DO. (If symptoms worsen, As needed)    Specialty:  Obstetrics and Gynecology   Contact information:   [place] [address] [phone]         Lorene Dy, CNM 02/12/2014 4:18 PM

## 2014-02-12 NOTE — Discharge Instructions (Signed)
Cutaneous Candidiasis Cutaneous candidiasis is a condition in which there is an overgrowth of yeast (candida) on the skin. Yeast normally live on the skin, but in small enough numbers not to cause any symptoms. In certain cases, increased growth of the yeast may cause an actual yeast infection. This kind of infection usually occurs in areas of the skin that are constantly warm and moist, such as the armpits or the groin. Yeast is the most common cause of diaper rash in babies and in people who cannot control their bowel movements (incontinence). CAUSES  The fungus that most often causes cutaneous candidiasis is Candida albicans. Conditions that can increase the risk of getting a yeast infection of the skin include:  Obesity.  Pregnancy.  Diabetes.  Taking antibiotic medicine.  Taking birth control pills.  Taking steroid medicines.  Thyroid disease.  An iron or zinc deficiency.  Problems with the immune system. SYMPTOMS   Red, swollen area of the skin.  Bumps on the skin.  Itchiness. DIAGNOSIS  The diagnosis of cutaneous candidiasis is usually based on its appearance. Light scrapings of the skin may also be taken and viewed under a microscope to identify the presence of yeast. TREATMENT  Antifungal creams may be applied to the infected skin. In severe cases, oral medicines may be needed.  HOME CARE INSTRUCTIONS   Keep your skin clean and dry.  Maintain a healthy weight.  If you have diabetes, keep your blood sugar under control. SEEK IMMEDIATE MEDICAL CARE IF:  Your rash continues to spread despite treatment.  You have a fever, chills, or abdominal pain. Document Released: 08/27/2011 Document Revised: 03/02/2012 Document Reviewed: 08/27/2011 [facility] Patient Information 2014 [place]. Sitz Bath A sitz bath is a warm water bath taken in the sitting position that covers only the hips and buttocks. It may be used for either healing or hygiene purposes. Sitz baths  are also used to relieve pain, itching, or muscle spasms. The water may contain medicine. Moist heat will help you heal and relax.  HOME CARE INSTRUCTIONS  Take 3 to 4 sitz baths a day. 1. Fill the bathtub half full with warm water. 2. Sit in the water and open the drain a little. 3. Turn on the warm water to keep the tub half full. Keep the water running constantly. 4. Soak in the water for 15 to 20 minutes. 5. After the sitz bath, pat the affected area dry first. SEEK MEDICAL CARE IF:  You get worse instead of better. Stop the sitz baths if you get worse. MAKE SURE YOU:  Understand these instructions.  Will watch your condition.  Will get help right away if you are not doing well or get worse. Document Released: 08/31/2004 Document Revised: 09/02/2012 Document Reviewed: 03/08/2011 [facility] Patient Information 2014 [place], [place].

## 2014-02-12 NOTE — MAU Note (Signed)
Pt presents complaining of a yeast infection. States Dr. Rogue Bussing called a prescription for Diflucan into the pharmacy on Thursday. Pt took it and used monistat externally with no relief. States she is currently on her cycle.

## 2014-03-15 ENCOUNTER — Encounter: Payer: Self-pay | Admitting: Internal Medicine

## 2014-03-15 ENCOUNTER — Ambulatory Visit (INDEPENDENT_AMBULATORY_CARE_PROVIDER_SITE_OTHER): Payer: Managed Care, Other (non HMO) | Admitting: Internal Medicine

## 2014-03-15 VITALS — BP 122/82 | HR 87 | Temp 98.1°F | Resp 12 | Ht 65.0 in | Wt 247.0 lb

## 2014-03-15 DIAGNOSIS — E119 Type 2 diabetes mellitus without complications: Secondary | ICD-10-CM

## 2014-03-15 LAB — HEMOGLOBIN A1C
Hgb A1c MFr Bld: 10.7 % — ABNORMAL HIGH (ref <5.7)
MEAN PLASMA GLUCOSE: 260 mg/dL — AB (ref <117)

## 2014-03-15 MED ORDER — SITAGLIPTIN PHOSPHATE 100 MG PO TABS: 100.0000 mg | ORAL_TABLET | Freq: Every day | ORAL | Status: DC

## 2014-03-15 MED ORDER — NYSTATIN-TRIAMCINOLONE 100000-0.1 UNIT/GM-% EX OINT: 1.0000 "application " | TOPICAL_OINTMENT | Freq: Two times a day (BID) | CUTANEOUS | Status: DC

## 2014-03-15 MED ORDER — LISINOPRIL 5 MG PO TABS: 5.0000 mg | ORAL_TABLET | Freq: Every day | ORAL | Status: DC

## 2014-03-15 MED ORDER — INSULIN GLARGINE 100 UNITS/ML SOLOSTAR PEN: PEN_INJECTOR | SUBCUTANEOUS | Status: DC

## 2014-03-15 MED ORDER — METFORMIN HCL ER 500 MG PO TB24: 500.0000 mg | ORAL_TABLET | Freq: Every day | ORAL | Status: DC

## 2014-03-15 NOTE — Patient Instructions (Signed)
Please decrease the Lantus to 40 units at night. Start Januvia 100 mg in am. Please start Metformin ER 500 mg with dinner x 4 days. If you tolerate this well, add another Metformin tablet (500 mg) with breakfast x 4 days. If you tolerate this well, add another metformin tablet with dinner (total 1000 mg) x 4 days. If you tolerate this well, add another metformin tablets with breakfast (total 1000 mg). Continue with 1000 mg of metformin twice a day with breakfast and dinner.  Please return in 1 month with your sugar log.  Check sugars 2-3x a day >> bring your log.  PATIENT INSTRUCTIONS FOR TYPE 2 DIABETES:  **Please join MyChart!** - see attached instructions about how to join   DIET AND EXERCISE Diet and exercise is an important part of diabetic treatment.  We recommended aerobic exercise in the form of brisk walking (working between 40-60% of maximal aerobic capacity, similar to brisk walking) for 150 minutes per week (such as 30 minutes five days per week) along with 3 times per week performing 'resistance' training (using various gauge rubber tubes with handles) 5-10 exercises involving the major muscle groups (upper body, lower body and core) performing 10-15 repetitions (or near fatigue) each exercise. Start at half the above goal but build slowly to reach the above goals. If limited by weight, joint pain, or disability, we recommend daily walking in a swimming pool with water up to waist to reduce pressure from joints while allow for adequate exercise.    BLOOD GLUCOSES Monitoring your blood glucoses is important for continued management of your diabetes. Please check your blood glucoses 2-4 times a day: fasting, before meals and at bedtime (you can rotate these measurements - e.g. one day check before the 3 meals, the next day check before 2 of the meals and before bedtime, etc.   HYPOGLYCEMIA (low blood sugar) Hypoglycemia is usually a reaction to not eating, exercising, or taking too much  insulin/ other diabetes drugs.  Symptoms include tremors, sweating, hunger, confusion, headache, etc. Treat IMMEDIATELY with 15 grams of Carbs:   4 glucose tablets    cup regular juice/soda   2 tablespoons raisins   4 teaspoons sugar   1 tablespoon honey Recheck blood glucose in 15 mins and repeat above if still symptomatic/blood glucose <100. Please contact our office at [phone] if you have questions about how to next handle your insulin.  RECOMMENDATIONS TO REDUCE YOUR RISK OF DIABETIC COMPLICATIONS: * Take your prescribed MEDICATION(S). * Follow a DIABETIC diet: Complex carbs, fiber rich foods, heart healthy fish twice weekly, (monounsaturated and polyunsaturated) fats * AVOID saturated/trans fats, high fat foods, >2,300 mg salt per day. * EXERCISE at least 5 times a week for 30 minutes or preferably daily.  * DO NOT SMOKE OR DRINK more than 1 drink a day. * Check your FEET every day. Do not wear tightfitting shoes. Contact us if you develop an ulcer * See your EYE doctor once a year or more if needed * Get a FLU shot once a year * Get a PNEUMONIA vaccine once before and once after age 90 years  GOALS:  * Your Hemoglobin A1c of <7%  * fasting sugars need to be <130 * after meals sugars need to be <180 (2h after you start eating) * Your Systolic BP should be 341 or lower  * Your Diastolic BP should be 80 or lower  * Your HDL (Good Cholesterol) should be 40 or higher  * Your LDL (  Bad Cholesterol) should be 100 or lower  * Your Triglycerides should be 150 or lower  * Your Urine microalbumin (kidney function) should be <30 * Your Body Mass Index should be 25 or lower   We will be glad to help you achieve these goals. Our telephone number is: [phone].

## 2014-03-15 NOTE — Progress Notes (Signed)
Patient ID: Erica Nguyen, female   DOB: 1971/11/01, 43 y.o.   MRN: [id number]  HPI: Erica Nguyen is a 43 y.o.-year-old female, self-referred for management of DM2, insulin-dependent, uncontrolled, without complications. Previous PCP: Dr Erica Nguyen.  Patient has been diagnosed with diabetes in 1993; she started insulin 2007 during pregnancy. Last hemoglobin A1c was: 10% (08/2013) << 7%. She has been stressed as her mother passed away 1.5 years ago.  Pt is on a regimen of: - Lantus 10 units in am and 50 units at night She tried Metformin >> diarrhea.  Pt checks her sugars  a day and they are: - am: 120-133 - 2h after b'fast: n/c - before lunch: n/c - 2h after lunch: n/c - before dinner: 295-320 - 2h after dinner: n/c - bedtime: n/c No lows. Lowest sugar was 120; she has hypoglycemia awareness at 80.  Highest sugar was 320.  Pt's meals are: - Breakfast: cheese crackers and juice - Lunch: [place] sandwich + chips - Dinner: meat + veggies + starch - Snacks: cheese crackers, oatmeal cookies Regular pepsi can at lunchtime  - regular.   - no CKD, last BUN/creatinine:  Lab Results  Component Value Date   BUN 11 03/19/2012   CREATININE 0.60 03/19/2012  She was taken off Lisinopril. - last eye exam was in 12/2013. No [facility]. - no numbness and tingling in her feet.  I reviewed her chart and she also has a [facility] of [facility]  Pt has FH of DM in mother and father.   ROS: Constitutional: no weight gain/loss, no fatigue, no subjective hyperthermia/hypothermia, + poor sleep Eyes: no blurry vision, no xerophthalmia ENT: no sore throat, no nodules palpated in throat, no dysphagia/odynophagia, no hoarseness Cardiovascular: no CP/SOB/palpitations/leg swelling Respiratory: no cough/SOB Gastrointestinal: no N/V/D/C Musculoskeletal: no muscle/joint aches Skin: no rashes Neurological: no tremors/numbness/tingling/dizziness Psychiatric: no  depression/anxiety  Past Medical History  Diagnosis Date  . Hypertension     chronic hypertension  . Diabetes mellitus     insulin-dependent  . Abnormal Pap smear of vagina   . History of anemia   . Polyhydramnios   . History of varicella     Childhood Varicell  . Fibroid   . Abnormal Pap smear    Past Surgical History  Procedure Laterality Date  . Cardiac catheterization  02/29/2008    Normal coronary arteries -- Normal LV (left ventricular) systolic function -- Mild to moderate elevation in left ventricular end-diastolic  pressure secondary to hypertension, diabetes and obesity  . Novasure ablation  01/08/2008  . Cholecystectomy  1995  . Hysteroscopy, d&c, novasure ablation, removal of intrauterine device  01/08/2008  . Gynecologic cryosurgery  1998   History   Social History  . Marital Status: Single    Spouse Name: N/A    Number of Children: 1   Occupational History  . Customer service   Social History Main Topics  . Smoking status: Never Smoker   . Smokeless tobacco: Never Used  . Alcohol Use: No  . Drug Use: No  . Sexual Activity: Yes    Birth Control/ Protection: IUD     Comment: x 1 partner   Current Outpatient Prescriptions on File Prior to Visit  Medication Sig Dispense Refill  . fluconazole (DIFLUCAN) 150 MG tablet Take 1 tablet (150 mg total) by mouth once.  2 tablet  0  . naproxen sodium (ANAPROX) 220 MG tablet Take 220 mg by mouth 2 (two) times daily as  needed (pain).       No current facility-administered medications on file prior to visit.   Allergies  Allergen Reactions  . Amoxicillin     Give pt yeast infection   Family History  Problem Relation Age of Onset  . Heart disease Mother   . Diabetes Mother   . Hypertension Father   . Diabetes Father   . Kidney failure Father   . Stroke Father   . Hypertension Paternal Grandmother   . Diabetes Paternal Grandmother   . Stroke Paternal Grandmother   . Diabetes Maternal Grandmother   .  Ovarian cancer Maternal Grandmother   . Diabetes Maternal Grandfather   . Diabetes Paternal Grandfather    PE: BP 122/82  Pulse 87  Temp(Src) 98.1 F (36.7 C) (Oral)  Resp 12  Ht 5\' 5"  (1.651 m)  Wt 247 lb (112.038 kg)  BMI 41.10 kg/m2  SpO2 98%  LMP 03/04/2014 Wt Readings from Last 3 Encounters:  03/15/14 247 lb (112.038 kg)  10/06/13 240 lb (108.863 kg)  10/01/13 240 lb (108.863 kg)   Constitutional: overweight, in NAD Eyes: PERRLA, EOMI, no exophthalmos ENT: moist mucous membranes, no thyromegaly, no cervical lymphadenopathy Cardiovascular: RRR, No MRG Respiratory: CTA B Gastrointestinal: abdomen soft, NT, ND, BS+ Musculoskeletal: no deformities, strength intact in all 4 Skin: moist, warm, no rashes Neurological: no tremor with outstretched hands, DTR normal in all 4  ASSESSMENT: 1. DM2, insulin-dependent, uncontrolled, without complications  PLAN:  1. Patient with long-standing, recently more uncontrolled diabetes, on only basal insulin regimen, which became insufficient - We discussed about options for treatment, and I suggested to:  Patient Instructions  Please decrease the Lantus to 40 units at night. Start Januvia 100 mg in am. Please start Metformin ER 500 mg with dinner x 4 days. If you tolerate this well, add another Metformin tablet (500 mg) with breakfast x 4 days. If you tolerate this well, add another metformin tablet with dinner (total 1000 mg) x 4 days. If you tolerate this well, add another metformin tablets with breakfast (total 1000 mg). Continue with 1000 mg of metformin twice a day with breakfast and dinner.  Please return in 1 month with your sugar log.  Check sugars 2-3x a day >> bring your log.  - Strongly advised her to start checking sugars at different times of the day - check 2-3 times a day, rotating checks - given sugar log and advised how to fill it and to bring it at next appt  - given foot care handout and explained the principles  -  given instructions for hypoglycemia management "15-15 rule"  - advised for yearly eye exams - she is up to date - check A1c - refilled Lisinopril >> check BMP at next visit - refilled Mycolog for cutaneous candidiasis - Return to clinic in 1 mo with sugar log   Office Visit on 03/15/2014  Component Date Value Ref Range Status  . HM Diabetic Eye Exam 12/25/2013 No Retinopathy  No Retinopathy Final  . Hemoglobin A1C 03/15/2014 10.7* <5.7 % Final   Comment:  According to the ADA Clinical Practice Recommendations for 2011, when                          HbA1c is used as a screening test:                                                       >=6.5%   Diagnostic of Diabetes Mellitus                                     (if abnormal result is confirmed)                                                     5.7-6.4%   Increased risk of developing Diabetes Mellitus                                                     References:Diagnosis and Classification of Diabetes Mellitus,Diabetes                          XBLT,9030,09(QZRAQ 1):S62-S69 and Standards of Medical Care in                                  Diabetes - 2011,Diabetes TMAU,6333,54 (Suppl 1):S11-S61.                             . Mean Plasma Glucose 03/15/2014 260* <117 mg/dL Final

## 2014-04-04 ENCOUNTER — Other Ambulatory Visit: Payer: Self-pay

## 2014-04-04 DIAGNOSIS — Z1231 Encounter for screening mammogram for malignant neoplasm of breast: Secondary | ICD-10-CM

## 2014-04-13 ENCOUNTER — Other Ambulatory Visit: Payer: Self-pay | Admitting: Obstetrics & Gynecology

## 2014-04-13 DIAGNOSIS — D219 Benign neoplasm of connective and other soft tissue, unspecified: Secondary | ICD-10-CM

## 2014-04-15 ENCOUNTER — Ambulatory Visit: Payer: Managed Care, Other (non HMO) | Admitting: Internal Medicine

## 2014-04-15 ENCOUNTER — Ambulatory Visit
Admission: RE | Admit: 2014-04-15 | Discharge: 2014-04-15 | Disposition: A | Discharge status: Home / Self Care | Payer: Managed Care, Other (non HMO) | Source: Ambulatory Visit

## 2014-04-15 DIAGNOSIS — Z1231 Encounter for screening mammogram for malignant neoplasm of breast: Secondary | ICD-10-CM

## 2014-04-20 ENCOUNTER — Other Ambulatory Visit: Payer: Self-pay | Admitting: Emergency Medicine

## 2014-04-20 ENCOUNTER — Other Ambulatory Visit: Payer: Self-pay | Admitting: Obstetrics and Gynecology

## 2014-04-20 ENCOUNTER — Encounter (INDEPENDENT_AMBULATORY_CARE_PROVIDER_SITE_OTHER): Payer: Self-pay

## 2014-04-20 ENCOUNTER — Ambulatory Visit
Admission: RE | Admit: 2014-04-20 | Discharge: 2014-04-20 | Disposition: A | Discharge status: Home / Self Care | Payer: Managed Care, Other (non HMO) | Source: Ambulatory Visit | Attending: Obstetrics & Gynecology | Admitting: Obstetrics & Gynecology

## 2014-04-20 VITALS — Ht 65.0 in | Wt 244.0 lb

## 2014-04-20 DIAGNOSIS — D219 Benign neoplasm of connective and other soft tissue, unspecified: Secondary | ICD-10-CM

## 2014-04-20 DIAGNOSIS — I10 Essential (primary) hypertension: Secondary | ICD-10-CM

## 2014-04-20 DIAGNOSIS — R928 Other abnormal and inconclusive findings on diagnostic imaging of breast: Secondary | ICD-10-CM

## 2014-04-20 LAB — CREATININE WITH EST GFR
CREATININE: 0.52 mg/dL (ref 0.50–1.10)
GFR, Est African American: >89 mL/min
GFR, Est Non African American: >89 mL/min

## 2014-04-20 LAB — BUN: BUN: 11 mg/dL (ref 6–23)

## 2014-04-21 ENCOUNTER — Other Ambulatory Visit: Payer: Self-pay | Admitting: Obstetrics & Gynecology

## 2014-04-21 DIAGNOSIS — D259 Leiomyoma of uterus, unspecified: Secondary | ICD-10-CM

## 2014-04-21 DIAGNOSIS — N920 Excessive and frequent menstruation with regular cycle: Secondary | ICD-10-CM

## 2014-04-24 ENCOUNTER — Other Ambulatory Visit: Payer: Managed Care, Other (non HMO)

## 2014-04-28 ENCOUNTER — Other Ambulatory Visit: Payer: Managed Care, Other (non HMO)

## 2014-05-05 ENCOUNTER — Ambulatory Visit
Admission: RE | Admit: 2014-05-05 | Discharge: 2014-05-05 | Disposition: A | Discharge status: Home / Self Care | Payer: Managed Care, Other (non HMO) | Source: Ambulatory Visit | Attending: Obstetrics & Gynecology | Admitting: Obstetrics & Gynecology

## 2014-05-05 DIAGNOSIS — D259 Leiomyoma of uterus, unspecified: Secondary | ICD-10-CM

## 2014-05-05 DIAGNOSIS — N920 Excessive and frequent menstruation with regular cycle: Secondary | ICD-10-CM

## 2014-05-05 MED ORDER — GADOBENATE DIMEGLUMINE 529 MG/ML IV SOLN
19.0000 mL | Freq: Once | INTRAVENOUS | Status: AC | PRN
  Administered 2014-05-05: 19 mL via INTRAVENOUS

## 2014-05-06 ENCOUNTER — Ambulatory Visit
Admission: RE | Admit: 2014-05-06 | Discharge: 2014-05-06 | Disposition: A | Discharge status: Home / Self Care | Payer: Managed Care, Other (non HMO) | Source: Ambulatory Visit | Attending: Obstetrics and Gynecology | Admitting: Obstetrics and Gynecology

## 2014-05-06 DIAGNOSIS — R928 Other abnormal and inconclusive findings on diagnostic imaging of breast: Secondary | ICD-10-CM

## 2014-05-18 ENCOUNTER — Telehealth: Payer: Self-pay | Admitting: Interventional Radiology

## 2014-05-18 ENCOUNTER — Telehealth: Payer: Self-pay | Admitting: Emergency Medicine

## 2014-05-18 NOTE — Telephone Encounter (Signed)
CALLED PT TO MAKE HER AWARE THAT DR SHICK REVIEWED HER MRI AND SHE IS STILL A GOOD CANDIDATE FOR [place]. TOLD HER THAT SHE HAS 1 HUGE FIBROID AND ABOUT 3 OTHER SMALLER ONES.   WILL CK W./ INS. AND HAVE TIFFANY AT [facility] TO CONTACT HER DIRECTLY TO SET UP PROCEDURE.  Janith Lima, EMT 05/18/2014 10:03 AM

## 2014-05-19 ENCOUNTER — Other Ambulatory Visit: Payer: Self-pay | Admitting: Interventional Radiology

## 2014-05-19 DIAGNOSIS — D219 Benign neoplasm of connective and other soft tissue, unspecified: Secondary | ICD-10-CM

## 2014-05-31 ENCOUNTER — Emergency Department (INDEPENDENT_AMBULATORY_CARE_PROVIDER_SITE_OTHER)
Admission: EM | Admit: 2014-05-31 | Discharge: 2014-05-31 | Disposition: A | Discharge status: Home / Self Care | Payer: Managed Care, Other (non HMO) | Source: Home / Self Care | Attending: Emergency Medicine | Admitting: Emergency Medicine

## 2014-05-31 ENCOUNTER — Encounter (HOSPITAL_COMMUNITY): Payer: Self-pay | Admitting: Emergency Medicine

## 2014-05-31 DIAGNOSIS — J069 Acute upper respiratory infection, unspecified: Secondary | ICD-10-CM

## 2014-05-31 MED ORDER — IPRATROPIUM BROMIDE 0.06 % NA SOLN: 2.0000 | Freq: Four times a day (QID) | NASAL | Status: DC

## 2014-05-31 MED ORDER — CEFDINIR 300 MG PO CAPS: 300.0000 mg | ORAL_CAPSULE | Freq: Two times a day (BID) | ORAL | Status: DC

## 2014-05-31 MED ORDER — HYDROCOD POLST-CHLORPHEN POLST 10-8 MG/5ML PO LQCR: 5.0000 mL | Freq: Two times a day (BID) | ORAL | Status: DC | PRN

## 2014-05-31 NOTE — ED Provider Notes (Signed)
  Chief Complaint   Chief Complaint  Patient presents with  . URI    History of Present Illness   Erica Nguyen is a 43 year old female who's had a three-day history of nonproductive cough, rattling in the chest, burning in the chest, nasal congestion with yellow drainage, headache, burning of the sinuses, watery eyes, has felt hot, and nausea. She's also had a sore throat. She denies any wheezing, earache, fever, vomiting, diarrhea, or abdominal pain. She has had no sick exposures.  Review of Systems   Other than as noted above, the patient denies any of the following symptoms: Systemic:  No fevers, chills, sweats, or myalgias. Eye:  No redness or discharge. ENT:  No ear pain, headache. Neck:  No neck pain, stiffness, or swollen glands. Lungs:  No sputum production, hemoptysis, wheezing, chest tightness, shortness of breath or chest pain. GI:  No abdominal pain, vomiting or diarrhea.  [place]   Past medical history, family history, social history, meds, and allergies were reviewed. She has hypertension and diabetes and takes Lantus and lisinopril.  Physical exam   Vital signs:  BP 149/95  Pulse 91  Temp(Src) 98.7 F (37.1 C) (Oral)  Resp 18  SpO2 100%  LMP 05/31/2014 General:  Alert and oriented.  In no distress.  Skin warm and dry. Eye:  No conjunctival injection or drainage. Lids were normal. ENT:  TMs and canals were normal, without erythema or inflammation.  Nasal mucosa was clear and uncongested, without drainage.  Mucous membranes were moist.  Pharynx was clear with no exudate or drainage.  There were no oral ulcerations or lesions. Neck:  Supple, no adenopathy, tenderness or mass. Lungs:  No respiratory distress.  Lungs were clear to auscultation, without wheezes, rales or rhonchi.  Breath sounds were clear and equal bilaterally.  Heart:  Regular rhythm, without gallops, murmers or rubs. Skin:  Clear, warm, and dry, without rash or lesions.  Assessment     The  encounter diagnosis was Viral URI.  Plan    1.  Meds:  The following meds were prescribed:   New Prescriptions   CEFDINIR (OMNICEF) 300 MG CAPSULE    Take 1 capsule (300 mg total) by mouth 2 (two) times daily.   CHLORPHENIRAMINE-HYDROCODONE (TUSSIONEX) 10-8 MG/5ML LQCR    Take 5 mLs by mouth every 12 (twelve) hours as needed for cough.   IPRATROPIUM (ATROVENT) 0.06 % NASAL SPRAY    Place 2 sprays into both nostrils 4 (four) times daily.    2.  Patient Education/Counseling:  The patient was given appropriate handouts, self care instructions, and instructed in symptomatic relief.  Instructed to get extra fluids, rest, and use a cool mist vaporizer.  patient told not to get the antibiotic filled, she was still sick in another 4-5 days.  3.  Follow up:  The patient was told to follow up here if no better in 3 to 4 days, or sooner if becoming worse in any way, and given some red flag symptoms such as increasing fever, difficulty breathing, chest pain, or persistent vomiting which would prompt immediate return.  Follow up here as needed.      Harden Mo, MD 05/31/14 [phone]

## 2014-05-31 NOTE — ED Notes (Signed)
Pt c/o cold/flu like sx onset 2 days Sx include: cough, chest/nasal congestion, facial pressure, nauseas Denies f/v/d Taking OTC cold meds w/no relief Alert w/no signs of acute distress.

## 2014-05-31 NOTE — Discharge Instructions (Signed)

## 2014-06-02 ENCOUNTER — Other Ambulatory Visit (HOSPITAL_COMMUNITY): Payer: Managed Care, Other (non HMO)

## 2014-06-09 ENCOUNTER — Telehealth: Payer: Self-pay | Admitting: Emergency Medicine

## 2014-06-09 NOTE — Telephone Encounter (Signed)
CALLED PT TO MAKE HER AWARE THAT [employer] DID NOT REQUIRE AUTHO OR PREDETERM.  ALSO SEE THAT SHE IS SCHEDULED FOR PROCEDURE ON 06-27-14-  ASKE HER TO FORWARD FMLA PAPERS TO ME ASAP, WOULD RATHER DO AHEAD OF TIME INCASE SHE NEEDS THE 2ND WK OFF POST PROCEDURE.  SHE WILL BRING THE FORMS TO ME

## 2014-06-21 ENCOUNTER — Encounter (HOSPITAL_COMMUNITY): Payer: Self-pay | Admitting: Pharmacy Technician

## 2014-06-22 ENCOUNTER — Other Ambulatory Visit: Payer: Self-pay | Admitting: Radiology

## 2014-06-27 ENCOUNTER — Encounter (HOSPITAL_COMMUNITY): Payer: Self-pay

## 2014-06-27 ENCOUNTER — Ambulatory Visit (HOSPITAL_COMMUNITY)
Admission: RE | Admit: 2014-06-27 | Discharge: 2014-06-27 | Disposition: A | Discharge status: Home / Self Care | Payer: Managed Care, Other (non HMO) | Source: Ambulatory Visit | Attending: Interventional Radiology | Admitting: Interventional Radiology

## 2014-06-27 ENCOUNTER — Observation Stay (HOSPITAL_COMMUNITY)
Admission: RE | Admit: 2014-06-27 | Discharge: 2014-06-28 | Disposition: A | Discharge status: Home / Self Care | Payer: Managed Care, Other (non HMO) | Source: Ambulatory Visit | Attending: Interventional Radiology | Admitting: Interventional Radiology

## 2014-06-27 ENCOUNTER — Other Ambulatory Visit: Payer: Self-pay | Admitting: Interventional Radiology

## 2014-06-27 VITALS — BP 131/74 | HR 80 | Temp 97.5°F | Resp 18 | Ht 65.0 in | Wt 255.5 lb

## 2014-06-27 DIAGNOSIS — D219 Benign neoplasm of connective and other soft tissue, unspecified: Secondary | ICD-10-CM

## 2014-06-27 DIAGNOSIS — Z9889 Other specified postprocedural states: Secondary | ICD-10-CM | POA: Insufficient documentation

## 2014-06-27 DIAGNOSIS — D259 Leiomyoma of uterus, unspecified: Principal | ICD-10-CM | POA: Insufficient documentation

## 2014-06-27 DIAGNOSIS — I10 Essential (primary) hypertension: Secondary | ICD-10-CM | POA: Insufficient documentation

## 2014-06-27 DIAGNOSIS — D251 Intramural leiomyoma of uterus: Secondary | ICD-10-CM | POA: Diagnosis present

## 2014-06-27 DIAGNOSIS — N949 Unspecified condition associated with female genital organs and menstrual cycle: Secondary | ICD-10-CM | POA: Diagnosis present

## 2014-06-27 DIAGNOSIS — D252 Subserosal leiomyoma of uterus: Secondary | ICD-10-CM

## 2014-06-27 DIAGNOSIS — N925 Other specified irregular menstruation: Secondary | ICD-10-CM | POA: Diagnosis present

## 2014-06-27 DIAGNOSIS — E119 Type 2 diabetes mellitus without complications: Secondary | ICD-10-CM | POA: Insufficient documentation

## 2014-06-27 DIAGNOSIS — D649 Anemia, unspecified: Secondary | ICD-10-CM | POA: Insufficient documentation

## 2014-06-27 LAB — GLUCOSE, CAPILLARY: Glucose-Capillary: 154 mg/dL — ABNORMAL HIGH (ref 70–99)

## 2014-06-27 LAB — BASIC METABOLIC PANEL
Anion gap: 13 (ref 5–15)
BUN: 9 mg/dL (ref 6–23)
CO2: 25 mEq/L (ref 19–32)
Calcium: 8.9 mg/dL (ref 8.4–10.5)
Chloride: 100 mEq/L (ref 96–112)
Creatinine, Ser: 0.54 mg/dL (ref 0.50–1.10)
GFR calc non Af Amer: >90 mL/min (ref >90)
GLUCOSE: 180 mg/dL — AB (ref 70–99)
POTASSIUM: 3.5 meq/L — AB (ref 3.7–5.3)
SODIUM: 138 meq/L (ref 137–147)

## 2014-06-27 LAB — PROTIME-INR
INR: 0.87 (ref 0.00–1.49)
PROTHROMBIN TIME: 11.8 s (ref 11.6–15.2)

## 2014-06-27 LAB — CBC WITH DIFFERENTIAL/PLATELET
Basophils Absolute: 0 10*3/uL (ref 0.0–0.1)
Basophils Relative: 0 % (ref 0–1)
Eosinophils Absolute: 0.1 10*3/uL (ref 0.0–0.7)
Eosinophils Relative: 1 % (ref 0–5)
HCT: 35.1 % — ABNORMAL LOW (ref 36.0–46.0)
Hemoglobin: 12.2 g/dL (ref 12.0–15.0)
LYMPHS ABS: 2.7 10*3/uL (ref 0.7–4.0)
LYMPHS PCT: 23 % (ref 12–46)
MCH: 29 pg (ref 26.0–34.0)
MCHC: 34.8 g/dL (ref 30.0–36.0)
MCV: 83.4 fL (ref 78.0–100.0)
Monocytes Absolute: 0.6 10*3/uL (ref 0.1–1.0)
Monocytes Relative: 5 % (ref 3–12)
NEUTROS PCT: 71 % (ref 43–77)
Neutro Abs: 8.1 10*3/uL — ABNORMAL HIGH (ref 1.7–7.7)
PLATELETS: 291 10*3/uL (ref 150–400)
RBC: 4.21 MIL/uL (ref 3.87–5.11)
RDW: 13.2 % (ref 11.5–15.5)
WBC: 11.5 10*3/uL — AB (ref 4.0–10.5)

## 2014-06-27 LAB — APTT: aPTT: 27 seconds (ref 24–37)

## 2014-06-27 LAB — HCG, SERUM, QUALITATIVE: Preg, Serum: NEGATIVE

## 2014-06-27 MED ORDER — LIDOCAINE HCL 1 % IJ SOLN
INTRAMUSCULAR | Status: AC
  Filled 2014-06-27: qty 20

## 2014-06-27 MED ORDER — HYDROMORPHONE 0.3 MG/ML IV SOLN
INTRAVENOUS | Status: DC
  Administered 2014-06-27: 2.1 mg via INTRAVENOUS
  Administered 2014-06-27: 1.5 mg via INTRAVENOUS
  Administered 2014-06-27: 21:00:00 via INTRAVENOUS
  Administered 2014-06-28: 1.2 mg via INTRAVENOUS
  Administered 2014-06-28: 0.6 mg via INTRAVENOUS
  Administered 2014-06-28: 0.1 mg via INTRAVENOUS
  Filled 2014-06-27: qty 25

## 2014-06-27 MED ORDER — DIPHENHYDRAMINE HCL 12.5 MG/5ML PO ELIX
12.5000 mg | ORAL_SOLUTION | Freq: Four times a day (QID) | ORAL | Status: DC | PRN
  Filled 2014-06-27: qty 5

## 2014-06-27 MED ORDER — HYDROMORPHONE 0.3 MG/ML IV SOLN
INTRAVENOUS | Status: AC
  Administered 2014-06-27: 1.8 mg via INTRAVENOUS
  Filled 2014-06-27: qty 25

## 2014-06-27 MED ORDER — SODIUM CHLORIDE 0.9 % IJ SOLN: 3.0000 mL | Freq: Two times a day (BID) | INTRAMUSCULAR | Status: DC

## 2014-06-27 MED ORDER — PROMETHAZINE HCL 25 MG PO TABS: 25.0000 mg | ORAL_TABLET | Freq: Three times a day (TID) | ORAL | Status: DC | PRN

## 2014-06-27 MED ORDER — MIDAZOLAM HCL 2 MG/2ML IJ SOLN
INTRAMUSCULAR | Status: AC | PRN
  Administered 2014-06-27: 1 mg via INTRAVENOUS
  Administered 2014-06-27: 2 mg via INTRAVENOUS

## 2014-06-27 MED ORDER — PROMETHAZINE HCL 25 MG RE SUPP: 25.0000 mg | Freq: Three times a day (TID) | RECTAL | Status: DC | PRN

## 2014-06-27 MED ORDER — DIPHENHYDRAMINE HCL 50 MG/ML IJ SOLN
12.5000 mg | Freq: Four times a day (QID) | INTRAMUSCULAR | Status: DC | PRN
Start: 2014-06-27 — End: 2014-06-28

## 2014-06-27 MED ORDER — HYDROMORPHONE HCL PF 1 MG/ML IJ SOLN
INTRAMUSCULAR | Status: AC | PRN
  Administered 2014-06-27 (×2): 1 mg via INTRAVENOUS

## 2014-06-27 MED ORDER — DOCUSATE SODIUM 100 MG PO CAPS
100.0000 mg | ORAL_CAPSULE | Freq: Two times a day (BID) | ORAL | Status: DC
  Administered 2014-06-27: 100 mg via ORAL
  Filled 2014-06-27 (×4): qty 1

## 2014-06-27 MED ORDER — KETOROLAC TROMETHAMINE 30 MG/ML IJ SOLN
INTRAMUSCULAR | Status: AC
  Filled 2014-06-27: qty 1

## 2014-06-27 MED ORDER — SODIUM CHLORIDE 0.9 % IV SOLN: 250.0000 mL | INTRAVENOUS | Status: DC | PRN

## 2014-06-27 MED ORDER — SODIUM CHLORIDE 0.9 % IV SOLN
INTRAVENOUS | Status: DC
  Administered 2014-06-27: 1000 mL via INTRAVENOUS

## 2014-06-27 MED ORDER — NALOXONE HCL 0.4 MG/ML IJ SOLN
0.4000 mg | INTRAMUSCULAR | Status: DC | PRN
Start: 2014-06-27 — End: 2014-06-28

## 2014-06-27 MED ORDER — SODIUM CHLORIDE 0.9 % IJ SOLN: 3.0000 mL | INTRAMUSCULAR | Status: DC | PRN

## 2014-06-27 MED ORDER — HYDROCODONE-ACETAMINOPHEN 5-325 MG PO TABS
1.0000 | ORAL_TABLET | ORAL | Status: DC | PRN
  Administered 2014-06-28: 2 via ORAL
  Filled 2014-06-27: qty 2

## 2014-06-27 MED ORDER — KETOROLAC TROMETHAMINE 30 MG/ML IJ SOLN
30.0000 mg | Freq: Four times a day (QID) | INTRAMUSCULAR | Status: DC
  Administered 2014-06-27 – 2014-06-28 (×4): 30 mg via INTRAVENOUS
  Filled 2014-06-27 (×8): qty 1

## 2014-06-27 MED ORDER — IOHEXOL 300 MG/ML  SOLN
80.0000 mL | Freq: Once | INTRAMUSCULAR | Status: AC | PRN
  Administered 2014-06-27: 1 mL

## 2014-06-27 MED ORDER — ONDANSETRON HCL 4 MG/2ML IJ SOLN: 4.0000 mg | Freq: Four times a day (QID) | INTRAMUSCULAR | Status: DC | PRN

## 2014-06-27 MED ORDER — MIDAZOLAM HCL 2 MG/2ML IJ SOLN
INTRAMUSCULAR | Status: AC | PRN
  Administered 2014-06-27 (×2): 1 mg via INTRAVENOUS
  Administered 2014-06-27: 2 mg via INTRAVENOUS
  Administered 2014-06-27: 1 mg via INTRAVENOUS

## 2014-06-27 MED ORDER — HYDROMORPHONE HCL PF 2 MG/ML IJ SOLN
INTRAMUSCULAR | Status: AC
  Filled 2014-06-27: qty 1

## 2014-06-27 MED ORDER — FENTANYL CITRATE 0.05 MG/ML IJ SOLN
INTRAMUSCULAR | Status: AC | PRN
  Administered 2014-06-27 (×2): 100 ug via INTRAVENOUS

## 2014-06-27 MED ORDER — SODIUM CHLORIDE 0.9 % IJ SOLN: 9.0000 mL | INTRAMUSCULAR | Status: DC | PRN

## 2014-06-27 MED ORDER — MIDAZOLAM HCL 2 MG/2ML IJ SOLN
INTRAMUSCULAR | Status: AC
  Filled 2014-06-27: qty 8

## 2014-06-27 MED ORDER — KETOROLAC TROMETHAMINE 30 MG/ML IJ SOLN
30.0000 mg | INTRAMUSCULAR | Status: AC
  Administered 2014-06-27: 30 mg via INTRAVENOUS

## 2014-06-27 MED ORDER — VANCOMYCIN HCL 10 G IV SOLR
1500.0000 mg | INTRAVENOUS | Status: AC
  Administered 2014-06-27: 1500 mg via INTRAVENOUS
  Filled 2014-06-27: qty 1500

## 2014-06-27 MED ORDER — FENTANYL CITRATE 0.05 MG/ML IJ SOLN
INTRAMUSCULAR | Status: AC
  Filled 2014-06-27: qty 8

## 2014-06-27 MED ORDER — ONDANSETRON HCL 4 MG/2ML IJ SOLN
4.0000 mg | Freq: Four times a day (QID) | INTRAMUSCULAR | Status: DC | PRN
  Administered 2014-06-27: 4 mg via INTRAVENOUS
  Filled 2014-06-27: qty 2

## 2014-06-27 NOTE — Procedures (Signed)
Successful UFE bilateral No comp Stable Full report in pacs

## 2014-06-27 NOTE — Progress Notes (Signed)
Foley catheter placed per order.  Second RN at bedside per protocol.  Pt tolerated well.  Coolidge Breeze, RN 06/27/2014

## 2014-06-27 NOTE — Progress Notes (Signed)
Pt states her pain is a 12 and the pain medicine is not bringing it down. She states the MD told her to expect that pain . K-pad applied to abd, pt states it helps with the pain. C/o abdominal cramps. Family at bedside. She was also medicated for nausea. Pt taking sips and a couple bites of jello. Dressings to both sides of groin clean and dry. PP's 2-3+. At 1900 HOB elevated 40 degrees.

## 2014-06-27 NOTE — Progress Notes (Signed)
Subjective: Pt c/o pelvic pain/cramping, sl improved with PCA dilaudid; denies N/V or breathing difficulties  Objective: Vital signs in last 24 hours: Temp:  [97.5 F (36.4 C)-98.3 F (36.8 C)] 98 F (36.7 C) (07/06 1345) Pulse Rate:  [83-106] 98 (07/06 1445) Resp:  [11-38] 16 (07/06 1445) BP: (127-174)/(69-99) 145/69 mmHg (07/06 1445) SpO2:  [96 %-100 %] 100 % (07/06 1445) Weight:  [255 lb 8.2 oz (115.9 kg)] 255 lb 8.2 oz (115.9 kg) (07/06 1200)    Intake/Output from previous day:   Intake/Output this shift: Total I/O In: -  Out: 200 [Urine:200]  Puncture sites L/R groins clean and dry,NT, no definite hematomas; mild-mod pelvic tenderness;ext sens/motor fxn intact; yellow urine in foley  Lab Results:   Recent Labs  06/27/14 0800  WBC 11.5*  HGB 12.2  HCT 35.1*  PLT 291   BMET  Recent Labs  06/27/14 0800  NA 138  K 3.5*  CL 100  CO2 25  GLUCOSE 180*  BUN 9  CREATININE 0.54  CALCIUM 8.9   PT/INR  Recent Labs  06/27/14 0800  LABPROT 11.8  INR 0.87   ABG No results found for this basename: PHART, PCO2, PO2, HCO3,  in the last 72 hours  Studies/Results: Ir Angiogram Pelvis Selective Or Supraselective  06/27/2014   CLINICAL DATA:  Symptomatic uterine fibroids, abnormal menstrual bleeding  EXAM: UTERINE FIBROID EMBOLIZATION  Date:  7/6/20157/05/2014 11:31 AM  Radiologist:  Jerilynn Mages. Daryll Brod, MD  Guidance:  Ultrasound and fluoroscopic  FLUOROSCOPY TIME:  33 min 18 seconds  MEDICATIONS AND MEDICAL HISTORY: 1.5 g vancomycin administered within 1 hr of the procedure, 30 mg Toradol, 8 mg Versed, 200 mcg fentanyl  ANESTHESIA/SEDATION: 1 hr 40 min  CONTRAST:  75 cc  COMPLICATIONS: None immediate  PROCEDURE: Informed consent was obtained from the patient following explanation of the procedure, risks, benefits and alternatives. The patient understands, agrees and consents for the procedure. All questions were addressed. A time out was performed.  Maximal barrier sterile  technique utilized including caps, mask, sterile gowns, sterile gloves, large sterile drape, hand hygiene, and betadine prep.  Under sterile conditions and local anesthesia, bilateralcommon femoral artery access was performed with micropuncture needles. Ultrasound was utilized for access. Images were obtained for documentation. Bentson guidewires were advanced followed by bilateral 5-French sheaths. A 5-French C2 catheter was utilized to select the contralateral left internal iliac artery. Selective left internal iliac angiogram was performed. The tortuous left uterine artery was identified. Selective catheterization was performed of the left uterine artery with a microcatheter and micro guide wire. A selective left uterine angiogram was performed. This demonstrated patency of the left uterine artery. Mild diffuse hypervascularity of the enlarged fibroid uterus. Access was adequate for embolization. For embolization, 2 of 500 - 734micron Embospheres and 1of 700-900 micron Embospheres were injected into the left uterine artery. Post embolization angiogram confirms complete stasis of the left uterine vascular territory. Microcatheter was removed.  A second C2 catheter was utilized to select the contralateral right internal iliac artery. Selective right internal iliac angiogram was performed. The patent right uterine artery was identified. For selective catheterization, the micro catheter and guidewire were utilized to select the right uterine artery. Selective right uterine angiogram was performed. This demonstrated patency of the right uterine artery. Catheter position was safe for embolization. Embolization was performed to complete stasis with injection of 2 of 500-700 micron Embospheres and 1of 700-900 micron Embospheres. Post embolization angiogram confirms complete stasis of the right uterine vascular territory.  Microcatheter and Fathom catheter were removed.  Injection of bothcommon femoral artery sheaths  confirms access is adequate for Starclose on the right. Hemostasis was obtained with a 6-French Starclose device on the right. No immediate complication. Peripheral pedal pulses are normal +2. Left common femoral artery puncture is at the bifurcation. Hemostasis obtained with manual compression for 15 min. No immediate complication.  The patient tolerated the procedure well. No immediate complication.  IMPRESSION: Successful bilateral uterine artery embolization (U [facility] E)   Electronically Signed   By: Daryll Brod M.D.   On: 06/27/2014 12:49   Ir Angiogram Pelvis Selective Or Supraselective  06/27/2014   CLINICAL DATA:  Symptomatic uterine fibroids, abnormal menstrual bleeding  EXAM: UTERINE FIBROID EMBOLIZATION  Date:  7/6/20157/05/2014 11:31 AM  Radiologist:  Jerilynn Mages. Daryll Brod, MD  Guidance:  Ultrasound and fluoroscopic  FLUOROSCOPY TIME:  33 min 18 seconds  MEDICATIONS AND MEDICAL HISTORY: 1.5 g vancomycin administered within 1 hr of the procedure, 30 mg Toradol, 8 mg Versed, 200 mcg fentanyl  ANESTHESIA/SEDATION: 1 hr 40 min  CONTRAST:  75 cc  COMPLICATIONS: None immediate  PROCEDURE: Informed consent was obtained from the patient following explanation of the procedure, risks, benefits and alternatives. The patient understands, agrees and consents for the procedure. All questions were addressed. A time out was performed.  Maximal barrier sterile technique utilized including caps, mask, sterile gowns, sterile gloves, large sterile drape, hand hygiene, and betadine prep.  Under sterile conditions and local anesthesia, bilateralcommon femoral artery access was performed with micropuncture needles. Ultrasound was utilized for access. Images were obtained for documentation. Bentson guidewires were advanced followed by bilateral 5-French sheaths. A 5-French C2 catheter was utilized to select the contralateral left internal iliac artery. Selective left internal iliac angiogram was performed. The tortuous left uterine  artery was identified. Selective catheterization was performed of the left uterine artery with a microcatheter and micro guide wire. A selective left uterine angiogram was performed. This demonstrated patency of the left uterine artery. Mild diffuse hypervascularity of the enlarged fibroid uterus. Access was adequate for embolization. For embolization, 2 of 500 - 724micron Embospheres and 1of 700-900 micron Embospheres were injected into the left uterine artery. Post embolization angiogram confirms complete stasis of the left uterine vascular territory. Microcatheter was removed.  A second C2 catheter was utilized to select the contralateral right internal iliac artery. Selective right internal iliac angiogram was performed. The patent right uterine artery was identified. For selective catheterization, the micro catheter and guidewire were utilized to select the right uterine artery. Selective right uterine angiogram was performed. This demonstrated patency of the right uterine artery. Catheter position was safe for embolization. Embolization was performed to complete stasis with injection of 2 of 500-700 micron Embospheres and 1of 700-900 micron Embospheres. Post embolization angiogram confirms complete stasis of the right uterine vascular territory.  Microcatheter and Fathom catheter were removed.  Injection of bothcommon femoral artery sheaths confirms access is adequate for Starclose on the right. Hemostasis was obtained with a 6-French Starclose device on the right. No immediate complication. Peripheral pedal pulses are normal +2. Left common femoral artery puncture is at the bifurcation. Hemostasis obtained with manual compression for 15 min. No immediate complication.  The patient tolerated the procedure well. No immediate complication.  IMPRESSION: Successful bilateral uterine artery embolization (U [facility] E)   Electronically Signed   By: Daryll Brod M.D.   On: 06/27/2014 12:49   Ir Angiogram Selective Each  Additional Vessel  06/27/2014   CLINICAL  DATA:  Symptomatic uterine fibroids, abnormal menstrual bleeding  EXAM: UTERINE FIBROID EMBOLIZATION  Date:  7/6/20157/05/2014 11:31 AM  Radiologist:  Jerilynn Mages. Daryll Brod, MD  Guidance:  Ultrasound and fluoroscopic  FLUOROSCOPY TIME:  33 min 18 seconds  MEDICATIONS AND MEDICAL HISTORY: 1.5 g vancomycin administered within 1 hr of the procedure, 30 mg Toradol, 8 mg Versed, 200 mcg fentanyl  ANESTHESIA/SEDATION: 1 hr 40 min  CONTRAST:  75 cc  COMPLICATIONS: None immediate  PROCEDURE: Informed consent was obtained from the patient following explanation of the procedure, risks, benefits and alternatives. The patient understands, agrees and consents for the procedure. All questions were addressed. A time out was performed.  Maximal barrier sterile technique utilized including caps, mask, sterile gowns, sterile gloves, large sterile drape, hand hygiene, and betadine prep.  Under sterile conditions and local anesthesia, bilateralcommon femoral artery access was performed with micropuncture needles. Ultrasound was utilized for access. Images were obtained for documentation. Bentson guidewires were advanced followed by bilateral 5-French sheaths. A 5-French C2 catheter was utilized to select the contralateral left internal iliac artery. Selective left internal iliac angiogram was performed. The tortuous left uterine artery was identified. Selective catheterization was performed of the left uterine artery with a microcatheter and micro guide wire. A selective left uterine angiogram was performed. This demonstrated patency of the left uterine artery. Mild diffuse hypervascularity of the enlarged fibroid uterus. Access was adequate for embolization. For embolization, 2 of 500 - 766micron Embospheres and 1of 700-900 micron Embospheres were injected into the left uterine artery. Post embolization angiogram confirms complete stasis of the left uterine vascular territory. Microcatheter was  removed.  A second C2 catheter was utilized to select the contralateral right internal iliac artery. Selective right internal iliac angiogram was performed. The patent right uterine artery was identified. For selective catheterization, the micro catheter and guidewire were utilized to select the right uterine artery. Selective right uterine angiogram was performed. This demonstrated patency of the right uterine artery. Catheter position was safe for embolization. Embolization was performed to complete stasis with injection of 2 of 500-700 micron Embospheres and 1of 700-900 micron Embospheres. Post embolization angiogram confirms complete stasis of the right uterine vascular territory.  Microcatheter and Fathom catheter were removed.  Injection of bothcommon femoral artery sheaths confirms access is adequate for Starclose on the right. Hemostasis was obtained with a 6-French Starclose device on the right. No immediate complication. Peripheral pedal pulses are normal +2. Left common femoral artery puncture is at the bifurcation. Hemostasis obtained with manual compression for 15 min. No immediate complication.  The patient tolerated the procedure well. No immediate complication.  IMPRESSION: Successful bilateral uterine artery embolization ([facility] E)   Electronically Signed   By: Daryll Brod M.D.   On: 06/27/2014 12:49   Ir Angiogram Selective Each Additional Vessel  06/27/2014   CLINICAL DATA:  Symptomatic uterine fibroids, abnormal menstrual bleeding  EXAM: UTERINE FIBROID EMBOLIZATION  Date:  7/6/20157/05/2014 11:31 AM  Radiologist:  Jerilynn Mages. Daryll Brod, MD  Guidance:  Ultrasound and fluoroscopic  FLUOROSCOPY TIME:  33 min 18 seconds  MEDICATIONS AND MEDICAL HISTORY: 1.5 g vancomycin administered within 1 hr of the procedure, 30 mg Toradol, 8 mg Versed, 200 mcg fentanyl  ANESTHESIA/SEDATION: 1 hr 40 min  CONTRAST:  75 cc  COMPLICATIONS: None immediate  PROCEDURE: Informed consent was obtained from the patient following  explanation of the procedure, risks, benefits and alternatives. The patient understands, agrees and consents for the procedure. All questions were addressed. A time  out was performed.  Maximal barrier sterile technique utilized including caps, mask, sterile gowns, sterile gloves, large sterile drape, hand hygiene, and betadine prep.  Under sterile conditions and local anesthesia, bilateralcommon femoral artery access was performed with micropuncture needles. Ultrasound was utilized for access. Images were obtained for documentation. Bentson guidewires were advanced followed by bilateral 5-French sheaths. A 5-French C2 catheter was utilized to select the contralateral left internal iliac artery. Selective left internal iliac angiogram was performed. The tortuous left uterine artery was identified. Selective catheterization was performed of the left uterine artery with a microcatheter and micro guide wire. A selective left uterine angiogram was performed. This demonstrated patency of the left uterine artery. Mild diffuse hypervascularity of the enlarged fibroid uterus. Access was adequate for embolization. For embolization, 2 of 500 - 731micron Embospheres and 1of 700-900 micron Embospheres were injected into the left uterine artery. Post embolization angiogram confirms complete stasis of the left uterine vascular territory. Microcatheter was removed.  A second C2 catheter was utilized to select the contralateral right internal iliac artery. Selective right internal iliac angiogram was performed. The patent right uterine artery was identified. For selective catheterization, the micro catheter and guidewire were utilized to select the right uterine artery. Selective right uterine angiogram was performed. This demonstrated patency of the right uterine artery. Catheter position was safe for embolization. Embolization was performed to complete stasis with injection of 2 of 500-700 micron Embospheres and 1of 700-900 micron  Embospheres. Post embolization angiogram confirms complete stasis of the right uterine vascular territory.  Microcatheter and Fathom catheter were removed.  Injection of bothcommon femoral artery sheaths confirms access is adequate for Starclose on the right. Hemostasis was obtained with a 6-French Starclose device on the right. No immediate complication. Peripheral pedal pulses are normal +2. Left common femoral artery puncture is at the bifurcation. Hemostasis obtained with manual compression for 15 min. No immediate complication.  The patient tolerated the procedure well. No immediate complication.  IMPRESSION: Successful bilateral uterine artery embolization ([facility] E)   Electronically Signed   By: Daryll Brod M.D.   On: 06/27/2014 12:49   Ir US Guide Vasc Access Left  06/27/2014   CLINICAL DATA:  Symptomatic uterine fibroids, abnormal menstrual bleeding  EXAM: UTERINE FIBROID EMBOLIZATION  Date:  7/6/20157/05/2014 11:31 AM  Radiologist:  Jerilynn Mages. Daryll Brod, MD  Guidance:  Ultrasound and fluoroscopic  FLUOROSCOPY TIME:  33 min 18 seconds  MEDICATIONS AND MEDICAL HISTORY: 1.5 g vancomycin administered within 1 hr of the procedure, 30 mg Toradol, 8 mg Versed, 200 mcg fentanyl  ANESTHESIA/SEDATION: 1 hr 40 min  CONTRAST:  75 cc  COMPLICATIONS: None immediate  PROCEDURE: Informed consent was obtained from the patient following explanation of the procedure, risks, benefits and alternatives. The patient understands, agrees and consents for the procedure. All questions were addressed. A time out was performed.  Maximal barrier sterile technique utilized including caps, mask, sterile gowns, sterile gloves, large sterile drape, hand hygiene, and betadine prep.  Under sterile conditions and local anesthesia, bilateralcommon femoral artery access was performed with micropuncture needles. Ultrasound was utilized for access. Images were obtained for documentation. Bentson guidewires were advanced followed by bilateral 5-French  sheaths. A 5-French C2 catheter was utilized to select the contralateral left internal iliac artery. Selective left internal iliac angiogram was performed. The tortuous left uterine artery was identified. Selective catheterization was performed of the left uterine artery with a microcatheter and micro guide wire. A selective left uterine angiogram was performed. This demonstrated  patency of the left uterine artery. Mild diffuse hypervascularity of the enlarged fibroid uterus. Access was adequate for embolization. For embolization, 2 of 500 - 778micron Embospheres and 1of 700-900 micron Embospheres were injected into the left uterine artery. Post embolization angiogram confirms complete stasis of the left uterine vascular territory. Microcatheter was removed.  A second C2 catheter was utilized to select the contralateral right internal iliac artery. Selective right internal iliac angiogram was performed. The patent right uterine artery was identified. For selective catheterization, the micro catheter and guidewire were utilized to select the right uterine artery. Selective right uterine angiogram was performed. This demonstrated patency of the right uterine artery. Catheter position was safe for embolization. Embolization was performed to complete stasis with injection of 2 of 500-700 micron Embospheres and 1of 700-900 micron Embospheres. Post embolization angiogram confirms complete stasis of the right uterine vascular territory.  Microcatheter and Fathom catheter were removed.  Injection of bothcommon femoral artery sheaths confirms access is adequate for Starclose on the right. Hemostasis was obtained with a 6-French Starclose device on the right. No immediate complication. Peripheral pedal pulses are normal +2. Left common femoral artery puncture is at the bifurcation. Hemostasis obtained with manual compression for 15 min. No immediate complication.  The patient tolerated the procedure well. No immediate  complication.  IMPRESSION: Successful bilateral uterine artery embolization ([facility] E)   Electronically Signed   By: Daryll Brod M.D.   On: 06/27/2014 12:49   Ir US Guide Vasc Access Right  06/27/2014   CLINICAL DATA:  Symptomatic uterine fibroids, abnormal menstrual bleeding  EXAM: UTERINE FIBROID EMBOLIZATION  Date:  7/6/20157/05/2014 11:31 AM  Radiologist:  Jerilynn Mages. Daryll Brod, MD  Guidance:  Ultrasound and fluoroscopic  FLUOROSCOPY TIME:  33 min 18 seconds  MEDICATIONS AND MEDICAL HISTORY: 1.5 g vancomycin administered within 1 hr of the procedure, 30 mg Toradol, 8 mg Versed, 200 mcg fentanyl  ANESTHESIA/SEDATION: 1 hr 40 min  CONTRAST:  75 cc  COMPLICATIONS: None immediate  PROCEDURE: Informed consent was obtained from the patient following explanation of the procedure, risks, benefits and alternatives. The patient understands, agrees and consents for the procedure. All questions were addressed. A time out was performed.  Maximal barrier sterile technique utilized including caps, mask, sterile gowns, sterile gloves, large sterile drape, hand hygiene, and betadine prep.  Under sterile conditions and local anesthesia, bilateralcommon femoral artery access was performed with micropuncture needles. Ultrasound was utilized for access. Images were obtained for documentation. Bentson guidewires were advanced followed by bilateral 5-French sheaths. A 5-French C2 catheter was utilized to select the contralateral left internal iliac artery. Selective left internal iliac angiogram was performed. The tortuous left uterine artery was identified. Selective catheterization was performed of the left uterine artery with a microcatheter and micro guide wire. A selective left uterine angiogram was performed. This demonstrated patency of the left uterine artery. Mild diffuse hypervascularity of the enlarged fibroid uterus. Access was adequate for embolization. For embolization, 2 of 500 - 772micron Embospheres and 1of 700-900 micron  Embospheres were injected into the left uterine artery. Post embolization angiogram confirms complete stasis of the left uterine vascular territory. Microcatheter was removed.  A second C2 catheter was utilized to select the contralateral right internal iliac artery. Selective right internal iliac angiogram was performed. The patent right uterine artery was identified. For selective catheterization, the micro catheter and guidewire were utilized to select the right uterine artery. Selective right uterine angiogram was performed. This demonstrated patency of the right uterine  artery. Catheter position was safe for embolization. Embolization was performed to complete stasis with injection of 2 of 500-700 micron Embospheres and 1of 700-900 micron Embospheres. Post embolization angiogram confirms complete stasis of the right uterine vascular territory.  Microcatheter and Fathom catheter were removed.  Injection of bothcommon femoral artery sheaths confirms access is adequate for Starclose on the right. Hemostasis was obtained with a 6-French Starclose device on the right. No immediate complication. Peripheral pedal pulses are normal +2. Left common femoral artery puncture is at the bifurcation. Hemostasis obtained with manual compression for 15 min. No immediate complication.  The patient tolerated the procedure well. No immediate complication.  IMPRESSION: Successful bilateral uterine artery embolization (U [facility] E)   Electronically Signed   By: Daryll Brod M.D.   On: 06/27/2014 12:49   Ir Embo Tumor Organ Ischemia Infarct Inc Guide Roadmapping  06/27/2014   CLINICAL DATA:  Symptomatic uterine fibroids, abnormal menstrual bleeding  EXAM: UTERINE FIBROID EMBOLIZATION  Date:  7/6/20157/05/2014 11:31 AM  Radiologist:  Jerilynn Mages. Daryll Brod, MD  Guidance:  Ultrasound and fluoroscopic  FLUOROSCOPY TIME:  33 min 18 seconds  MEDICATIONS AND MEDICAL HISTORY: 1.5 g vancomycin administered within 1 hr of the procedure, 30 mg Toradol, 8  mg Versed, 200 mcg fentanyl  ANESTHESIA/SEDATION: 1 hr 40 min  CONTRAST:  75 cc  COMPLICATIONS: None immediate  PROCEDURE: Informed consent was obtained from the patient following explanation of the procedure, risks, benefits and alternatives. The patient understands, agrees and consents for the procedure. All questions were addressed. A time out was performed.  Maximal barrier sterile technique utilized including caps, mask, sterile gowns, sterile gloves, large sterile drape, hand hygiene, and betadine prep.  Under sterile conditions and local anesthesia, bilateralcommon femoral artery access was performed with micropuncture needles. Ultrasound was utilized for access. Images were obtained for documentation. Bentson guidewires were advanced followed by bilateral 5-French sheaths. A 5-French C2 catheter was utilized to select the contralateral left internal iliac artery. Selective left internal iliac angiogram was performed. The tortuous left uterine artery was identified. Selective catheterization was performed of the left uterine artery with a microcatheter and micro guide wire. A selective left uterine angiogram was performed. This demonstrated patency of the left uterine artery. Mild diffuse hypervascularity of the enlarged fibroid uterus. Access was adequate for embolization. For embolization, 2 of 500 - 766micron Embospheres and 1of 700-900 micron Embospheres were injected into the left uterine artery. Post embolization angiogram confirms complete stasis of the left uterine vascular territory. Microcatheter was removed.  A second C2 catheter was utilized to select the contralateral right internal iliac artery. Selective right internal iliac angiogram was performed. The patent right uterine artery was identified. For selective catheterization, the micro catheter and guidewire were utilized to select the right uterine artery. Selective right uterine angiogram was performed. This demonstrated patency of the right  uterine artery. Catheter position was safe for embolization. Embolization was performed to complete stasis with injection of 2 of 500-700 micron Embospheres and 1of 700-900 micron Embospheres. Post embolization angiogram confirms complete stasis of the right uterine vascular territory.  Microcatheter and Fathom catheter were removed.  Injection of bothcommon femoral artery sheaths confirms access is adequate for Starclose on the right. Hemostasis was obtained with a 6-French Starclose device on the right. No immediate complication. Peripheral pedal pulses are normal +2. Left common femoral artery puncture is at the bifurcation. Hemostasis obtained with manual compression for 15 min. No immediate complication.  The patient tolerated the procedure well. No  immediate complication.  IMPRESSION: Successful bilateral uterine artery embolization (U F E)   Electronically Signed   By: Daryll Brod M.D.   On: 06/27/2014 12:49    Anti-infectives: Anti-infectives   Start     Dose/Rate Route Frequency Ordered Stop   06/27/14 0800  vancomycin (VANCOCIN) 1,500 mg in sodium chloride 0.9 % 500 mL IVPB     1,500 mg 250 mL/hr over 120 Minutes Intravenous On call 06/27/14 0744 06/27/14 1140      Assessment/Plan: s/p bilat [place] secondary to symptomatic uterine fibroids; for overnight obs; cont PCA dilaudid; monitor for nausea- zofran prn; hydrate; check AM labs; f/u with Dr. Annamaria Boots in IR clinic in 2-4 weeks.  LOS: 0 days    ALLRED,D [facility] 06/27/2014

## 2014-06-27 NOTE — H&P (Signed)
Erica Nguyen is an 43 y.o. female.   Chief Complaint: heavy menstrual bleeding, uterine fibroids HPI: Patient with history of symptomatic uterine fibroids presents today for bilateral uterine artery embolization.  Past Medical History  Diagnosis Date  . Hypertension     chronic hypertension  . Diabetes mellitus     insulin-dependent  . Abnormal Pap smear of vagina   . History of anemia   . Polyhydramnios   . History of varicella     Childhood Varicell  . Fibroid   . Abnormal Pap smear     Past Surgical History  Procedure Laterality Date  . Cardiac catheterization  02/29/2008    Normal coronary arteries -- Normal LV (left ventricular) systolic function -- Mild to moderate elevation in left ventricular end-diastolic  pressure secondary to hypertension, diabetes and obesity  . Novasure ablation  01/08/2008  . Cholecystectomy  1995  . Hysteroscopy, d&c, novasure ablation, removal of intrauterine device  01/08/2008  . Gynecologic cryosurgery  1998    Family History  Problem Relation Age of Onset  . Heart disease Mother   . Diabetes Mother   . Hypertension Father   . Diabetes Father   . Kidney failure Father   . Stroke Father   . Hypertension Paternal Grandmother   . Diabetes Paternal Grandmother   . Stroke Paternal Grandmother   . Diabetes Maternal Grandmother   . Ovarian cancer Maternal Grandmother   . Diabetes Maternal Grandfather   . Diabetes Paternal Grandfather    Social History:  reports that she has never smoked. She has never used smokeless tobacco. She reports that she does not drink alcohol or use illicit drugs.  Allergies:  Allergies  Allergen Reactions  . Amoxicillin     Give pt yeast infection    Current outpatient prescriptions:insulin glargine (LANTUS) 100 UNIT/ML injection, Inject 10-50 Units into the skin 2 (two) times daily. TAKES 10-20 UNITS IN THE MORNING IF NEEDED AND 50 UNITS AT NIGHT, Disp: , Rfl: ;  lisinopril (PRINIVIL,ZESTRIL) 10 MG tablet,  Take 10 mg by mouth every evening., Disp: , Rfl: ;  naproxen sodium (ANAPROX) 220 MG tablet, Take 220 mg by mouth 2 (two) times daily as needed (FOR PAIN)., Disp: , Rfl:  Current facility-administered medications:0.9 %  sodium chloride infusion, , Intravenous, Continuous, D Kevin Brinkley Peet, PA-C;  diphenhydrAMINE (BENADRYL) 12.5 MG/5ML elixir 12.5 mg, 12.5 mg, Oral, Q6H PRN, Greggory Keen, MD;  diphenhydrAMINE (BENADRYL) injection 12.5 mg, 12.5 mg, Intravenous, Q6H PRN, Greggory Keen, MD;  HYDROmorphone (DILAUDID) PCA injection 0.3 mg/mL, , Intravenous, 6 times per day, Greggory Keen, MD ketorolac (TORADOL) 30 MG/ML injection 30 mg, 30 mg, Intravenous, On Call, D Kevin Aleksis Jiggetts, PA-C;  naloxone (NARCAN) injection 0.4 mg, 0.4 mg, Intravenous, PRN, Greggory Keen, MD;  ondansetron [facility]) injection 4 mg, 4 mg, Intravenous, Q6H PRN, Greggory Keen, MD;  sodium chloride 0.9 % injection 9 mL, 9 mL, Intravenous, PRN, Greggory Keen, MD vancomycin (VANCOCIN) 1,500 mg in sodium chloride 0.9 % 500 mL IVPB, 1,500 mg, Intravenous, On Call, D Rowe Robert, PA-C    Results for orders placed during the hospital encounter of 74/08/14  BASIC METABOLIC PANEL      Result Value Ref Range   Sodium 138  137 - 147 mEq/L   Potassium 3.5 (*) 3.7 - 5.3 mEq/L   Chloride 100  96 - 112 mEq/L   CO2 25  19 - 32 mEq/L   Glucose, Bld 180 (*) 70 - 99 mg/dL   BUN 9  6 - 23 mg/dL   Creatinine, Ser 0.54  0.50 - 1.10 mg/dL   Calcium 8.9  8.4 - 10.5 mg/dL   GFR calc non Af Amer >90  >90 mL/min   GFR calc Af Amer >90  >90 mL/min   Anion gap 13  5 - 15  CBC WITH DIFFERENTIAL      Result Value Ref Range   WBC 11.5 (*) 4.0 - 10.5 K/uL   RBC 4.21  3.87 - 5.11 MIL/uL   Hemoglobin 12.2  12.0 - 15.0 g/dL   HCT 35.1 (*) 36.0 - 46.0 %   MCV 83.4  78.0 - 100.0 fL   MCH 29.0  26.0 - 34.0 pg   MCHC 34.8  30.0 - 36.0 g/dL   RDW 13.2  11.5 - 15.5 %   Platelets 291  150 - 400 K/uL   Neutrophils Relative % 71  43 - 77 %   Neutro Abs 8.1 (*) 1.7  - 7.7 K/uL   Lymphocytes Relative 23  12 - 46 %   Lymphs Abs 2.7  0.7 - 4.0 K/uL   Monocytes Relative 5  3 - 12 %   Monocytes Absolute 0.6  0.1 - 1.0 K/uL   Eosinophils Relative 1  0 - 5 %   Eosinophils Absolute 0.1  0.0 - 0.7 K/uL   Basophils Relative 0  0 - 1 %   Basophils Absolute 0.0  0.0 - 0.1 K/uL  HCG, SERUM, QUALITATIVE      Result Value Ref Range   Preg, Serum NEGATIVE  NEGATIVE     Review of Systems  Constitutional: Negative for fever and chills.  Respiratory: Negative for cough and shortness of breath.   Cardiovascular: Negative for chest pain.  Gastrointestinal: Negative for nausea, vomiting and blood in stool.  Genitourinary: Negative for dysuria and hematuria.       Heavy menstrual bleeding with assoc pelvic pain/cramping  Musculoskeletal: Negative for back pain.  Neurological: Negative for headaches.    Blood pressure 156/99, pulse 106, temperature 97.5 F (36.4 C), temperature source Oral, resp. rate 20, last menstrual period 05/31/2014, SpO2 100.00%. Physical Exam  Constitutional: She is oriented to person, place, and time. She appears well-developed and well-nourished.  Cardiovascular:  Sl tachy but regular  Respiratory: Effort normal and breath sounds normal.  GI: Soft. Bowel sounds are normal. There is no tenderness.  Musculoskeletal: Normal range of motion. She exhibits no edema.  Neurological: She is alert and oriented to person, place, and time.     Assessment/Plan Patient with history of symptomatic uterine fibroids presents today for elective  bilateral uterine artery embolization. Details/risks of procedure d/w pt/sister with their understanding and consent. Postprocedure pt will be admitted for overnight observation for pain control/hemodynamic monitoring.  Erica Nguyen,D KEVIN 06/27/2014, 8:23 AM

## 2014-06-28 ENCOUNTER — Other Ambulatory Visit: Payer: Self-pay | Admitting: Radiology

## 2014-06-28 DIAGNOSIS — D259 Leiomyoma of uterus, unspecified: Secondary | ICD-10-CM | POA: Diagnosis not present

## 2014-06-28 DIAGNOSIS — D251 Intramural leiomyoma of uterus: Secondary | ICD-10-CM

## 2014-06-28 LAB — CBC
HEMATOCRIT: 34.4 % — AB (ref 36.0–46.0)
HEMOGLOBIN: 11.7 g/dL — AB (ref 12.0–15.0)
MCH: 29.2 pg (ref 26.0–34.0)
MCHC: 34 g/dL (ref 30.0–36.0)
MCV: 85.8 fL (ref 78.0–100.0)
Platelets: 234 10*3/uL (ref 150–400)
RBC: 4.01 MIL/uL (ref 3.87–5.11)
RDW: 13.4 % (ref 11.5–15.5)
WBC: 10.1 10*3/uL (ref 4.0–10.5)

## 2014-06-28 LAB — BASIC METABOLIC PANEL
Anion gap: 11 (ref 5–15)
BUN: 7 mg/dL (ref 6–23)
CHLORIDE: 101 meq/L (ref 96–112)
CO2: 26 mEq/L (ref 19–32)
Calcium: 8.3 mg/dL — ABNORMAL LOW (ref 8.4–10.5)
Creatinine, Ser: 0.48 mg/dL — ABNORMAL LOW (ref 0.50–1.10)
GFR calc Af Amer: >90 mL/min (ref >90)
GFR calc non Af Amer: >90 mL/min (ref >90)
GLUCOSE: 97 mg/dL (ref 70–99)
POTASSIUM: 3.4 meq/L — AB (ref 3.7–5.3)
Sodium: 138 mEq/L (ref 137–147)

## 2014-06-28 MED ORDER — IBUPROFEN 600 MG PO TABS: 600.0000 mg | ORAL_TABLET | Freq: Three times a day (TID) | ORAL | Status: DC

## 2014-06-28 MED ORDER — DSS 100 MG PO CAPS: 100.0000 mg | ORAL_CAPSULE | Freq: Two times a day (BID) | ORAL | Status: DC

## 2014-06-28 MED ORDER — PROMETHAZINE HCL 25 MG PO TABS: 25.0000 mg | ORAL_TABLET | Freq: Four times a day (QID) | ORAL | Status: DC | PRN

## 2014-06-28 MED ORDER — HYDROCODONE-ACETAMINOPHEN 5-325 MG PO TABS: 1.0000 | ORAL_TABLET | ORAL | Status: DC | PRN

## 2014-06-28 NOTE — Progress Notes (Signed)
Pt. Has been discharged home she was given her discharge instructions, prescriptions, and all questions were answered. She was transported home by family.

## 2014-06-28 NOTE — Discharge Instructions (Addendum)
Uterine Artery Embolization for Fibroids Uterine artery embolization is a nonsurgical treatment to shrink fibroids. A thin plastic tube (catheter) is used to inject material that blocks off the blood supply to the fibroid, which causes the fibroid to shrink. LET [facility] CARE PROVIDER KNOW ABOUT:  Any allergies you have.  All medicines you are taking, including vitamins, herbs, eye drops, creams, and over-the-counter medicines.  Previous problems you or members of your family have had with the use of anesthetics.  Any blood disorders you have.  Previous surgeries you have had.  Medical conditions you have. RISKS AND COMPLICATIONS  Injury to the uterus from decreased blood supply  Infection.  Blood infection (septicemia).  Lack of menstrual periods (amenorrhea).  Death of tissue cells (necrosis) around your bladder or vulva.  Development of a hole between organs or from an organ to the surface of your skin (fistula).  Blood clot in the legs (deep vein thrombosis) or lung (pulmonary embolus). BEFORE THE PROCEDURE  Ask your health care provider about changing or stopping your regular medicines.   Do not take aspirin or blood thinners (anticoagulants) for 1 week before the surgery or as directed by your health care provider.  Do not eat or drink anything for 8 hours before the surgery or as directed by your health care provider.   Empty your bladder before the procedure begins. PROCEDURE   An IV tube will be placed into one of your veins. This will be used to give you a sedative and pain medication (conscious sedation).  You will be given a medicine that numbs the area (local anesthetic).  A small cut will be made in your groin. A catheter is then inserted into the main artery of your leg.  The catheter will be guided through the artery to your uterus. A series of images will be taken while dye is injected through the catheter in your groin. X-rays are taken at the  same time. This is done to provide a road map of the blood supply to your uterus and fibroids.  Tiny plastic spheres, about the size of sand grains, will be injected through the catheter. Metal coils may be used to help block the artery. The particles will lodge in tiny branches of the uterine artery that supplies blood to the fibroids.  The procedure is repeated on the artery that supplies the other side of the uterus.  The catheter is then removed and pressure is held to stop any bleeding. No stitches are needed.  A dressing is then placed over the cut (incision). AFTER THE PROCEDURE  You will be taken to a recovery area where your progress will be monitored until you are awake, stable, and taking fluids well. If there are no other problems, you will then be moved to a regular hospital room.  You will be observed overnight in the hospital.  You will have cramping that should be controlled with pain medication. Document Released: 02/24/2006 Document Revised: 09/29/2013 Document Reviewed: 06/24/2013 [facility] Patient Information 2015 [place], [place]. This information is not intended to replace advice given to you by your health care provider. Make sure you discuss any questions you have with your health care provider.   The patient was given Versed, a benzodiazepine, as well as Fentanyl, an Opioid narcotic, as part of her sedation. She also received other forms of pain medication during her hospitalization, which are also in the opioid narcotic class. She has also been given a prescription of Hydrocodone.

## 2014-06-28 NOTE — Discharge Summary (Signed)
Physician Discharge Summary  Patient ID: Erica Nguyen MRN: [id number] DOB/AGE: 08-07-1971 43 y.o.  Admit date: 06/27/2014 Discharge date: 06/28/2014  Admission Diagnoses: Active Problems:   Fibroids, intramural  Discharge Diagnoses:  Active Problems:   Fibroids, intramural    Procedures: [place] 7/6  Discharged Condition: good  Hospital Course: HPI: pt with hx of symptomatic fibroids. Was scheduled for Uterine artery embolization Summary: Brought to IR suite on 7/6 and underwent successful [place] procedure. Procedure performed under moderate conscious sedation with Fentanyl and Versed. Pt tolerated the procedure well and was admitted to the floor in stable condition. Pain control was appropriate. Pt has voided after foley removed, has tolerated regular diet, and has been OOB/ambulating without difficulty. Her POD #1 exam is normal as are her labs She is determined to be stable for discharge. Restrictions, medications, and follow up plans reviewed.   Consults: None   Discharge Exam: Blood pressure 131/74, pulse 80, temperature 97.5 F (36.4 C), temperature source Oral, resp. rate 18, height 5\' 5"  (1.651 m), weight 255 lb 8.2 oz (115.9 kg), last menstrual period 05/31/2014, SpO2 100.00%. Lungs: CTA without w/r/r Heart: Regular Abdomen: soft, ND, NT Ext:(B)groin sites clean, dry, no hematoma   Disposition: 01-Home or Self Care  Discharge Instructions   Call MD for:  difficulty breathing, headache or visual disturbances    Complete by:  As directed      Call MD for:  persistant nausea and vomiting    Complete by:  As directed      Call MD for:  redness, tenderness, or signs of infection (pain, swelling, redness, odor or green/yellow discharge around incision site)    Complete by:  As directed      Call MD for:  severe uncontrolled pain    Complete by:  As directed      Call MD for:  temperature >100.4    Complete by:  As directed      Diet - low sodium heart healthy     Complete by:  As directed      Increase activity slowly    Complete by:  As directed      May shower / Bathe    Complete by:  As directed      May walk up steps    Complete by:  As directed      No dressing needed    Complete by:  As directed             Medication List    STOP taking these medications       naproxen sodium 220 MG tablet  Commonly known as:  ANAPROX      TAKE these medications       DSS 100 MG Caps  Take 100 mg by mouth 2 (two) times daily.     HYDROcodone-acetaminophen 5-325 MG per tablet  Commonly known as:  NORCO/VICODIN  Take 1-2 tablets by mouth every 4 (four) hours as needed for moderate pain.     ibuprofen 600 MG tablet  Commonly known as:  ADVIL,MOTRIN  Take 1 tablet (600 mg total) by mouth 3 (three) times daily.     insulin glargine 100 UNIT/ML injection  Commonly known as:  LANTUS  Inject 10-50 Units into the skin 2 (two) times daily. TAKES 10-20 UNITS IN THE MORNING IF NEEDED AND 50 UNITS AT NIGHT     lisinopril 10 MG tablet  Commonly known as:  PRINIVIL,ZESTRIL  Take 10 mg by mouth every evening.  promethazine 25 MG tablet  Commonly known as:  PHENERGAN  Take 1 tablet (25 mg total) by mouth every 6 (six) hours as needed for nausea.           Follow-up Information   Follow up with Earl Gala., MD In 2 weeks. ([name] from clinic will call you with appointment date/time)    Specialty:  Interventional Radiology   Contact information:   [address] [place] [address] [phone]       Signed: Ascencion Dike PA-C 06/28/2014, 10:17 AM

## 2014-06-28 NOTE — Progress Notes (Signed)
Patient voided 400 cc of urine after removal of foley catheter.

## 2014-07-13 ENCOUNTER — Encounter: Payer: Self-pay | Admitting: Radiology

## 2014-07-13 ENCOUNTER — Ambulatory Visit
Admission: RE | Admit: 2014-07-13 | Discharge: 2014-07-13 | Disposition: A | Discharge status: Home / Self Care | Payer: Managed Care, Other (non HMO) | Source: Ambulatory Visit | Attending: Radiology | Admitting: Radiology

## 2014-07-13 VITALS — BP 187/89 | HR 89 | Temp 98.2°F | Resp 13

## 2014-07-13 DIAGNOSIS — D251 Intramural leiomyoma of uterus: Secondary | ICD-10-CM

## 2014-07-13 NOTE — Progress Notes (Signed)
2 wk follow up [place]:  Afebrile.  Has not had a menstrual cycle post [place].  Has been experiencing minimal spotting.  No odor.  Continues to experience fatigue.  Requested that FMLA be extended until Monday, July 18, 2014.  Approved by Dr Annamaria Boots.    Patient instructed to take ibuprofen or naprosyn as needed.  Also, to increase activities as tolerated.  Donya Tomaro Riki [place], [place] 07/13/2014 10:59 AM

## 2014-08-18 ENCOUNTER — Emergency Department (HOSPITAL_COMMUNITY)
Admission: EM | Admit: 2014-08-18 | Discharge: 2014-08-18 | Disposition: A | Discharge status: Home / Self Care | Payer: Managed Care, Other (non HMO) | Attending: Emergency Medicine | Admitting: Emergency Medicine

## 2014-08-18 ENCOUNTER — Encounter (HOSPITAL_COMMUNITY): Payer: Self-pay | Admitting: Emergency Medicine

## 2014-08-18 DIAGNOSIS — Z794 Long term (current) use of insulin: Secondary | ICD-10-CM | POA: Diagnosis not present

## 2014-08-18 DIAGNOSIS — E119 Type 2 diabetes mellitus without complications: Secondary | ICD-10-CM | POA: Diagnosis not present

## 2014-08-18 DIAGNOSIS — Z79899 Other long term (current) drug therapy: Secondary | ICD-10-CM | POA: Insufficient documentation

## 2014-08-18 DIAGNOSIS — I10 Essential (primary) hypertension: Secondary | ICD-10-CM | POA: Diagnosis not present

## 2014-08-18 DIAGNOSIS — Z862 Personal history of diseases of the blood and blood-forming organs and certain disorders involving the immune mechanism: Secondary | ICD-10-CM | POA: Insufficient documentation

## 2014-08-18 DIAGNOSIS — L02411 Cutaneous abscess of right axilla: Secondary | ICD-10-CM

## 2014-08-18 DIAGNOSIS — R739 Hyperglycemia, unspecified: Secondary | ICD-10-CM

## 2014-08-18 DIAGNOSIS — Z8542 Personal history of malignant neoplasm of other parts of uterus: Secondary | ICD-10-CM | POA: Insufficient documentation

## 2014-08-18 DIAGNOSIS — Z880 Allergy status to penicillin: Secondary | ICD-10-CM | POA: Insufficient documentation

## 2014-08-18 DIAGNOSIS — IMO0002 Reserved for concepts with insufficient information to code with codable children: Secondary | ICD-10-CM | POA: Insufficient documentation

## 2014-08-18 DIAGNOSIS — Z8619 Personal history of other infectious and parasitic diseases: Secondary | ICD-10-CM | POA: Insufficient documentation

## 2014-08-18 DIAGNOSIS — Z791 Long term (current) use of non-steroidal anti-inflammatories (NSAID): Secondary | ICD-10-CM | POA: Insufficient documentation

## 2014-08-18 LAB — CBG MONITORING, ED: GLUCOSE-CAPILLARY: 286 mg/dL — AB (ref 70–99)

## 2014-08-18 MED ORDER — SODIUM BICARBONATE 4 % IV SOLN
5.0000 mL | Freq: Once | INTRAVENOUS | Status: AC
  Administered 2014-08-18: 5 mL via SUBCUTANEOUS
  Filled 2014-08-18: qty 5

## 2014-08-18 MED ORDER — FLUCONAZOLE 150 MG PO TABS: 150.0000 mg | ORAL_TABLET | Freq: Once | ORAL | Status: DC

## 2014-08-18 MED ORDER — CEPHALEXIN 500 MG PO CAPS: 500.0000 mg | ORAL_CAPSULE | Freq: Four times a day (QID) | ORAL | Status: DC

## 2014-08-18 MED ORDER — LIDOCAINE HCL (PF) 1 % IJ SOLN
10.0000 mL | Freq: Once | INTRAMUSCULAR | Status: AC
  Administered 2014-08-18: 10 mL via INTRADERMAL
  Filled 2014-08-18: qty 10

## 2014-08-18 MED ORDER — PENTAFLUOROPROP-TETRAFLUOROETH EX AERO
INHALATION_SPRAY | CUTANEOUS | Status: DC | PRN
  Administered 2014-08-18: 19:00:00 via TOPICAL
  Filled 2014-08-18: qty 103.5

## 2014-08-18 NOTE — ED Notes (Signed)
Pt reports painful swelling under r/arm. Red raised, swollen area noted in r/axillary area. Inflammed tissue  Approx. 3cm diameter

## 2014-08-18 NOTE — Discharge Instructions (Signed)

## 2014-08-18 NOTE — ED Notes (Signed)
  CBG 286  

## 2014-08-18 NOTE — ED Provider Notes (Signed)
CSN: [id number]     Arrival date & time 08/18/14  1804 History  This chart was scribed for non-physician practitioner, Margarita Mail PA-C, working with Virgel Manifold, MD by Lowella Petties, ED Scribe. The patient was seen in room WTR6/WTR6. Patient's care was started at 6:32 PM.    Chief Complaint  Patient presents with  . Abscess    abscess under r/arm x 1 week. Burning pain increased   The history is provided by the patient. No language interpreter was used.   HPI Comments: Erica Nguyen is a 43 y.o. female who presents to the Emergency Department complaining of a raised area of pain and swelling under her right arm. She reports using warm compress for this without relief. She denies fever or chills.  Past Medical History  Diagnosis Date  . Hypertension     chronic hypertension  . Diabetes mellitus     insulin-dependent  . Abnormal Pap smear of vagina   . History of anemia   . Polyhydramnios   . History of varicella     Childhood Varicell  . Fibroid   . Abnormal Pap smear    Past Surgical History  Procedure Laterality Date  . Cardiac catheterization  02/29/2008    Normal coronary arteries -- Normal LV (left ventricular) systolic function -- Mild to moderate elevation in left ventricular end-diastolic  pressure secondary to hypertension, diabetes and obesity  . Novasure ablation  01/08/2008  . Cholecystectomy  1995  . Hysteroscopy, d&c, novasure ablation, removal of intrauterine device  01/08/2008  . Gynecologic cryosurgery  1998   Family History  Problem Relation Age of Onset  . Heart disease Mother   . Diabetes Mother   . Hypertension Father   . Diabetes Father   . Kidney failure Father   . Stroke Father   . Hypertension Paternal Grandmother   . Diabetes Paternal Grandmother   . Stroke Paternal Grandmother   . Diabetes Maternal Grandmother   . Ovarian cancer Maternal Grandmother   . Diabetes Maternal Grandfather   . Diabetes Paternal Grandfather    History   Substance Use Topics  . Smoking status: Never Smoker   . Smokeless tobacco: Never Used  . Alcohol Use: No   OB History   Grav Para Term Preterm Abortions TAB SAB Ect Mult Living   3 1 1  2  2   1      Review of Systems  Constitutional: Negative for chills.  Skin: Positive for color change (abscess).   Allergies  Amoxicillin  Home Medications   Prior to Admission medications   Medication Sig Start Date End Date Taking? Authorizing Provider  docusate sodium 100 MG CAPS Take 100 mg by mouth 2 (two) times daily. 06/28/14   Ascencion Dike, PA-C  HYDROcodone-acetaminophen (NORCO/VICODIN) 5-325 MG per tablet Take 1-2 tablets by mouth every 4 (four) hours as needed for moderate pain. 06/28/14   Ascencion Dike, PA-C  ibuprofen (ADVIL,MOTRIN) 600 MG tablet Take 1 tablet (600 mg total) by mouth 3 (three) times daily. 06/28/14   Ascencion Dike, PA-C  insulin glargine (LANTUS) 100 UNIT/ML injection Inject 10-50 Units into the skin 2 (two) times daily. TAKES 10-20 UNITS IN THE MORNING IF NEEDED AND 50 UNITS AT NIGHT    Historical Provider, MD  lisinopril (PRINIVIL,ZESTRIL) 10 MG tablet Take 10 mg by mouth every evening.    Historical Provider, MD  promethazine (PHENERGAN) 25 MG tablet Take 1 tablet (25 mg total) by mouth every 6 (six) hours as  needed for nausea. 06/28/14   Ascencion Dike, PA-C   Triage Vitals: BP 138/76  Pulse 90  Temp(Src) 99 F (37.2 C) (Oral)  Resp 18  Wt 249 lb (112.946 kg)  SpO2 100%  LMP 07/23/2014 Physical Exam  Nursing note and vitals reviewed. Constitutional: She is oriented to person, place, and time. She appears well-developed and well-nourished. No distress.  HENT:  Head: Normocephalic and atraumatic.  Eyes: Conjunctivae and EOM are normal.  Neck: Neck supple. No tracheal deviation present.  Cardiovascular: Normal rate.   Pulmonary/Chest: Effort normal. No respiratory distress.  Musculoskeletal: Normal range of motion.  Neurological: She is alert and oriented to  person, place, and time.  Skin: Skin is warm and dry.  Psychiatric: She has a normal mood and affect. Her behavior is normal.    Right Axillary abscess, indurated, w/ central fluctuance    instructions for homecare and f/u.  ED Course  Procedures (including critical care time) DIAGNOSTIC STUDIES: Oxygen Saturation is 100% on room air, normal by my interpretation.    COORDINATION OF CARE: 6:32 PM-Discussed treatment plan which includes I&D with pt at bedside and pt agreed to plan.    Labs Review Labs Reviewed  CBG MONITORING, ED    Imaging Review No results found.   EKG Interpretation None       INCISION AND DRAINAGE Performed by: Margarita Mail Consent: Verbal consent obtained. Risks and benefits: risks, benefits and alternatives were discussed Type: abscess  Body area:R axilla  Anesthesia: local infiltration  Incision was made with a scalpel.  Local anesthetic: lidocaine 2% w/o epinephrine (9:1 solution 2% lidocaine and 4%bicarb)  Anesthetic total: 5 ml  Complexity: complex Blunt dissection to break up loculations  Drainage: purulent  Drainage amount: moderate  Packing material: No packing  Patient tolerance: Patient tolerated the procedure well with no immediate complications.    MDM   Final diagnoses:  Abscess of axilla, right  Hyperglycemia  DIABETES MELLITUS, TYPE II    Patient with skin abscess amenable to incision and drainage.  Abscess was not large enough to warrant packing or drain placement,  wound recheck in 2 days. No signs of cellulitis in surrounding skin.  Will d/c to home.  No antibiotic therapy is indicated.  I personally performed the services described in this documentation, which was scribed in my presence. The recorded information has been reviewed and is accurate.    Margarita Mail, PA-C 08/21/14 1142

## 2014-08-23 NOTE — ED Provider Notes (Signed)
Medical screening examination/treatment/procedure(s) were performed by non-physician practitioner and as supervising physician I was immediately available for consultation/collaboration.   EKG Interpretation None       Virgel Manifold, MD 08/23/14 1530

## 2014-10-24 ENCOUNTER — Encounter (HOSPITAL_COMMUNITY): Payer: Self-pay | Admitting: Emergency Medicine

## 2014-11-01 ENCOUNTER — Other Ambulatory Visit: Payer: Self-pay | Admitting: Obstetrics & Gynecology

## 2014-11-01 DIAGNOSIS — N6310 Unspecified lump in the right breast, unspecified quadrant: Secondary | ICD-10-CM

## 2014-11-11 ENCOUNTER — Ambulatory Visit
Admission: RE | Admit: 2014-11-11 | Discharge: 2014-11-11 | Disposition: A | Discharge status: Home / Self Care | Payer: Managed Care, Other (non HMO) | Source: Ambulatory Visit | Attending: Obstetrics & Gynecology | Admitting: Obstetrics & Gynecology

## 2014-11-11 DIAGNOSIS — N6310 Unspecified lump in the right breast, unspecified quadrant: Secondary | ICD-10-CM

## 2015-01-24 ENCOUNTER — Other Ambulatory Visit: Payer: Self-pay | Admitting: Obstetrics & Gynecology

## 2015-01-24 ENCOUNTER — Other Ambulatory Visit (HOSPITAL_COMMUNITY): Payer: Self-pay | Admitting: Interventional Radiology

## 2015-01-24 DIAGNOSIS — D259 Leiomyoma of uterus, unspecified: Secondary | ICD-10-CM

## 2015-02-08 ENCOUNTER — Encounter: Payer: Self-pay | Admitting: Radiology

## 2015-03-27 ENCOUNTER — Other Ambulatory Visit: Payer: Self-pay | Admitting: Obstetrics & Gynecology

## 2015-03-27 DIAGNOSIS — N6310 Unspecified lump in the right breast, unspecified quadrant: Secondary | ICD-10-CM

## 2015-03-31 ENCOUNTER — Ambulatory Visit
Admission: RE | Admit: 2015-03-31 | Discharge: 2015-03-31 | Disposition: A | Discharge status: Home / Self Care | Payer: 59 | Source: Ambulatory Visit | Attending: Obstetrics & Gynecology | Admitting: Obstetrics & Gynecology

## 2015-03-31 DIAGNOSIS — N6310 Unspecified lump in the right breast, unspecified quadrant: Secondary | ICD-10-CM

## 2015-05-07 ENCOUNTER — Emergency Department (HOSPITAL_COMMUNITY)
Admission: EM | Admit: 2015-05-07 | Discharge: 2015-05-07 | Disposition: A | Discharge status: Home / Self Care | Payer: 59 | Attending: Emergency Medicine | Admitting: Emergency Medicine

## 2015-05-07 ENCOUNTER — Encounter (HOSPITAL_COMMUNITY): Payer: Self-pay | Admitting: Emergency Medicine

## 2015-05-07 DIAGNOSIS — Z791 Long term (current) use of non-steroidal anti-inflammatories (NSAID): Secondary | ICD-10-CM | POA: Insufficient documentation

## 2015-05-07 DIAGNOSIS — E119 Type 2 diabetes mellitus without complications: Secondary | ICD-10-CM | POA: Insufficient documentation

## 2015-05-07 DIAGNOSIS — Z8619 Personal history of other infectious and parasitic diseases: Secondary | ICD-10-CM | POA: Insufficient documentation

## 2015-05-07 DIAGNOSIS — Z792 Long term (current) use of antibiotics: Secondary | ICD-10-CM | POA: Insufficient documentation

## 2015-05-07 DIAGNOSIS — Z862 Personal history of diseases of the blood and blood-forming organs and certain disorders involving the immune mechanism: Secondary | ICD-10-CM | POA: Diagnosis not present

## 2015-05-07 DIAGNOSIS — Z880 Allergy status to penicillin: Secondary | ICD-10-CM | POA: Diagnosis not present

## 2015-05-07 DIAGNOSIS — I10 Essential (primary) hypertension: Secondary | ICD-10-CM | POA: Diagnosis not present

## 2015-05-07 DIAGNOSIS — Z86018 Personal history of other benign neoplasm: Secondary | ICD-10-CM | POA: Diagnosis not present

## 2015-05-07 DIAGNOSIS — Z79899 Other long term (current) drug therapy: Secondary | ICD-10-CM | POA: Insufficient documentation

## 2015-05-07 DIAGNOSIS — Z9889 Other specified postprocedural states: Secondary | ICD-10-CM | POA: Insufficient documentation

## 2015-05-07 DIAGNOSIS — Z794 Long term (current) use of insulin: Secondary | ICD-10-CM | POA: Insufficient documentation

## 2015-05-07 DIAGNOSIS — M5431 Sciatica, right side: Secondary | ICD-10-CM | POA: Insufficient documentation

## 2015-05-07 DIAGNOSIS — M545 Low back pain: Secondary | ICD-10-CM | POA: Diagnosis present

## 2015-05-07 MED ORDER — OXYCODONE-ACETAMINOPHEN 5-325 MG PO TABS: 1.0000 | ORAL_TABLET | Freq: Four times a day (QID) | ORAL | Status: DC | PRN

## 2015-05-07 MED ORDER — CYCLOBENZAPRINE HCL 5 MG PO TABS: 5.0000 mg | ORAL_TABLET | Freq: Three times a day (TID) | ORAL | Status: DC | PRN

## 2015-05-07 NOTE — ED Notes (Signed)
Pt c/o lower back pain radiating into right leg. Pt state she had this incident about 2-3 years, diagnosed with sciatica.

## 2015-05-07 NOTE — ED Provider Notes (Signed)
CSN: [id number]     Arrival date & time 05/07/15  1621 History  This chart was scribed for a non-physician practitioner, Glendell Docker, NP working with Wandra Arthurs, MD by [place]ique Peace, ED Scribe. The patient was seen in [facility]. The patient's care was started at 4:43 PM.    Chief Complaint  Patient presents with  . Back Pain      Patient is a 44 y.o. female presenting with back pain. The history is provided by the patient. No language interpreter was used.  Back Pain   HPI Comments: Erica Nguyen is a 44 y.o. female who presents to the Emergency Department complaining of recurrent radiating lower back pain onset yesterday that runs down her right leg. She denies any recent traumas or mechanisms of injury. No complaints of numbness, weakness, or bowel incontinence. History of similar pain in the past where she reports she was diagnosed with sciatica. History of hypertension and DM.    Past Medical History  Diagnosis Date  . Hypertension     chronic hypertension  . Diabetes mellitus     insulin-dependent  . Abnormal Pap smear of vagina   . History of anemia   . Polyhydramnios   . History of varicella     Childhood Varicell  . Fibroid   . Abnormal Pap smear    Past Surgical History  Procedure Laterality Date  . Cardiac catheterization  02/29/2008    Normal coronary arteries -- Normal LV (left ventricular) systolic function -- Mild to moderate elevation in left ventricular end-diastolic  pressure secondary to hypertension, diabetes and obesity  . Novasure ablation  01/08/2008  . Cholecystectomy  1995  . Hysteroscopy, d&c, novasure ablation, removal of intrauterine device  01/08/2008  . Gynecologic cryosurgery  1998   Family History  Problem Relation Age of Onset  . Heart disease Mother   . Diabetes Mother   . Hypertension Father   . Diabetes Father   . Kidney failure Father   . Stroke Father   . Hypertension Paternal Grandmother   . Diabetes Paternal Grandmother   .  Stroke Paternal Grandmother   . Diabetes Maternal Grandmother   . Ovarian cancer Maternal Grandmother   . Diabetes Maternal Grandfather   . Diabetes Paternal Grandfather    History  Substance Use Topics  . Smoking status: Never Smoker   . Smokeless tobacco: Never Used  . Alcohol Use: No   OB History    Gravida Para Term Preterm AB TAB SAB Ectopic Multiple Living   3 1 1  2  2   1      Review of Systems  Genitourinary: Negative for decreased urine volume and difficulty urinating.  Musculoskeletal: Positive for back pain.  All other systems reviewed and are negative.     Allergies  Amoxicillin  Home Medications   Prior to Admission medications   Medication Sig Start Date End Date Taking? Authorizing Provider  cephALEXin (KEFLEX) 500 MG capsule Take 1 capsule (500 mg total) by mouth 4 (four) times daily. 08/18/14   Margarita Mail, PA-C  docusate sodium 100 MG CAPS Take 100 mg by mouth 2 (two) times daily. 06/28/14   Ascencion Dike, PA-C  fluconazole (DIFLUCAN) 150 MG tablet Take 1 tablet (150 mg total) by mouth once. 08/18/14   Margarita Mail, PA-C  HYDROcodone-acetaminophen (NORCO/VICODIN) 5-325 MG per tablet Take 1-2 tablets by mouth every 4 (four) hours as needed for moderate pain. 06/28/14   Ascencion Dike, PA-C  ibuprofen (ADVIL,MOTRIN) 600 MG  tablet Take 1 tablet (600 mg total) by mouth 3 (three) times daily. 06/28/14   Ascencion Dike, PA-C  insulin glargine (LANTUS) 100 UNIT/ML injection Inject 10-50 Units into the skin 2 (two) times daily. TAKES 10-20 UNITS IN THE MORNING IF NEEDED AND 50 UNITS AT NIGHT    Historical Provider, MD  lisinopril (PRINIVIL,ZESTRIL) 10 MG tablet Take 10 mg by mouth every evening.    Historical Provider, MD  promethazine (PHENERGAN) 25 MG tablet Take 1 tablet (25 mg total) by mouth every 6 (six) hours as needed for nausea. 06/28/14   Ascencion Dike, PA-C   BP 146/76 mmHg  Pulse 93  Temp(Src) 97.9 F (36.6 C) (Oral)  Resp 20  SpO2 100%  LMP 05/01/2015  (Exact Date) Physical Exam  Constitutional: She is oriented to person, place, and time. She appears well-developed and well-nourished.  Cardiovascular: Normal rate and regular rhythm.   Pulmonary/Chest: Effort normal and breath sounds normal.  Musculoskeletal: Normal range of motion.  Right sciatic notch tenderness. Equal strength and sensation to bilateral lower extremities  Neurological: She is alert and oriented to person, place, and time. She exhibits normal muscle tone. Coordination normal.  Skin: Skin is warm and dry.  Nursing note and vitals reviewed.   ED Course  Procedures (including critical care time) Labs Review Labs Reviewed - No data to display  Imaging Review No results found.   EKG Interpretation None     Medications - No data to display  4:44 PM- Treatment plan was discussed with patient who verbalizes understanding and agrees.   MDM   Final diagnoses:  Sciatica, right    Will treat oxycodone and flexeril. Discussed ollow up with pcp as needed  I personally performed the services described in this documentation, which was scribed in my presence. The recorded information has been reviewed and is accurate.    Glendell Docker, NP 05/07/15 [place] Yao, MD 05/07/15 [phone]

## 2015-05-07 NOTE — Discharge Instructions (Signed)
Back Pain, Adult °Back pain is very common. The pain often gets better over time. The cause of back pain is usually not dangerous. Most people can learn to manage their back pain on their own.  °HOME CARE  °· Stay active. Start with short walks on flat ground if you can. Try to walk farther each day. °· Do not sit, drive, or stand in one place for more than 30 minutes. Do not stay in bed. °· Do not avoid exercise or work. Activity can help your back heal faster. °· Be careful when you bend or lift an object. Bend at your knees, keep the object close to you, and do not twist. °· Sleep on a firm mattress. Lie on your side, and bend your knees. If you lie on your back, put a pillow under your knees. °· Only take medicines as told by your doctor. °· Put ice on the injured area. °¨ Put ice in a plastic bag. °¨ Place a towel between your skin and the bag. °¨ Leave the ice on for 15-20 minutes, 03-04 times a day for the first 2 to 3 days. After that, you can switch between ice and heat packs. °· Ask your doctor about back exercises or massage. °· Avoid feeling anxious or stressed. Find good ways to deal with stress, such as exercise. °GET HELP RIGHT AWAY IF:  °· Your pain does not go away with rest or medicine. °· Your pain does not go away in 1 week. °· You have new problems. °· You do not feel well. °· The pain spreads into your legs. °· You cannot control when you poop (bowel movement) or pee (urinate). °· Your arms or legs feel weak or lose feeling (numbness). °· You feel sick to your stomach (nauseous) or throw up (vomit). °· You have belly (abdominal) pain. °· You feel like you may pass out (faint). °MAKE SURE YOU:  °· Understand these instructions. °· Will watch your condition. °· Will get help right away if you are not doing well or get worse. °Document Released: 05/27/2008 Document Revised: 03/02/2012 Document Reviewed: 04/12/2014 °[employer]® Patient Information ©2015 [employer]. This information is not intended  to replace advice given to you by your health care provider. Make sure you discuss any questions you have with your health care provider. ° °

## 2015-06-10 DIAGNOSIS — E1165 Type 2 diabetes mellitus with hyperglycemia: Secondary | ICD-10-CM | POA: Insufficient documentation

## 2015-06-10 DIAGNOSIS — Z794 Long term (current) use of insulin: Secondary | ICD-10-CM

## 2015-06-10 DIAGNOSIS — M4727 Other spondylosis with radiculopathy, lumbosacral region: Secondary | ICD-10-CM | POA: Insufficient documentation

## 2015-06-11 ENCOUNTER — Emergency Department (HOSPITAL_COMMUNITY)
Admission: EM | Admit: 2015-06-11 | Discharge: 2015-06-11 | Disposition: A | Discharge status: Home / Self Care | Payer: 59 | Attending: Emergency Medicine | Admitting: Emergency Medicine

## 2015-06-11 ENCOUNTER — Encounter (HOSPITAL_COMMUNITY): Payer: Self-pay | Admitting: *Deleted

## 2015-06-11 DIAGNOSIS — M5431 Sciatica, right side: Secondary | ICD-10-CM | POA: Insufficient documentation

## 2015-06-11 DIAGNOSIS — Z791 Long term (current) use of non-steroidal anti-inflammatories (NSAID): Secondary | ICD-10-CM | POA: Insufficient documentation

## 2015-06-11 DIAGNOSIS — I10 Essential (primary) hypertension: Secondary | ICD-10-CM | POA: Diagnosis not present

## 2015-06-11 DIAGNOSIS — E119 Type 2 diabetes mellitus without complications: Secondary | ICD-10-CM | POA: Diagnosis not present

## 2015-06-11 DIAGNOSIS — Z792 Long term (current) use of antibiotics: Secondary | ICD-10-CM | POA: Insufficient documentation

## 2015-06-11 DIAGNOSIS — Z794 Long term (current) use of insulin: Secondary | ICD-10-CM | POA: Diagnosis not present

## 2015-06-11 DIAGNOSIS — Z8619 Personal history of other infectious and parasitic diseases: Secondary | ICD-10-CM | POA: Insufficient documentation

## 2015-06-11 DIAGNOSIS — Z862 Personal history of diseases of the blood and blood-forming organs and certain disorders involving the immune mechanism: Secondary | ICD-10-CM | POA: Insufficient documentation

## 2015-06-11 DIAGNOSIS — Z79899 Other long term (current) drug therapy: Secondary | ICD-10-CM | POA: Insufficient documentation

## 2015-06-11 DIAGNOSIS — M79604 Pain in right leg: Secondary | ICD-10-CM | POA: Diagnosis present

## 2015-06-11 DIAGNOSIS — Z86018 Personal history of other benign neoplasm: Secondary | ICD-10-CM | POA: Insufficient documentation

## 2015-06-11 MED ORDER — DIAZEPAM 5 MG PO TABS: 5.0000 mg | ORAL_TABLET | Freq: Two times a day (BID) | ORAL | Status: DC

## 2015-06-11 MED ORDER — OXYCODONE-ACETAMINOPHEN 5-325 MG PO TABS: 2.0000 | ORAL_TABLET | ORAL | Status: DC | PRN

## 2015-06-11 NOTE — ED Provider Notes (Signed)
CSN: [id number]     Arrival date & time 06/11/15  1654 History   None    Chief Complaint  Patient presents with  . Leg Pain     (Consider location/radiation/quality/duration/timing/severity/associated sxs/prior Treatment) Patient is a 44 y.o. female presenting with leg pain. The history is provided by the patient. No language interpreter was used.  Leg Pain Associated symptoms: no back pain and no neck pain    Miss Chandran is a 44 year old female with a history of hypertension, diabetes, anemia who presents for intermittent right-sided sciatic pain. She states she was evaluated by her PCP 3 weeks ago and given prednisone shot and Toradol. She has also used icy hot with minimal relief. She states she has an appointment with [facility] orthopedics on Friday but could not wait due to the pain. She was worried that she may have a blood clot in the leg. She denies any fever, chills, numbness, tingling, weakness.  Past Medical History  Diagnosis Date  . Hypertension     chronic hypertension  . Diabetes mellitus     insulin-dependent  . Abnormal Pap smear of vagina   . History of anemia   . Polyhydramnios   . History of varicella     Childhood Varicell  . Fibroid   . Abnormal Pap smear    Past Surgical History  Procedure Laterality Date  . Cardiac catheterization  02/29/2008    Normal coronary arteries -- Normal LV (left ventricular) systolic function -- Mild to moderate elevation in left ventricular end-diastolic  pressure secondary to hypertension, diabetes and obesity  . Novasure ablation  01/08/2008  . Cholecystectomy  1995  . Hysteroscopy, d&c, novasure ablation, removal of intrauterine device  01/08/2008  . Gynecologic cryosurgery  1998   Family History  Problem Relation Age of Onset  . Heart disease Mother   . Diabetes Mother   . Hypertension Father   . Diabetes Father   . Kidney failure Father   . Stroke Father   . Hypertension Paternal Grandmother   . Diabetes Paternal  Grandmother   . Stroke Paternal Grandmother   . Diabetes Maternal Grandmother   . Ovarian cancer Maternal Grandmother   . Diabetes Maternal Grandfather   . Diabetes Paternal Grandfather    History  Substance Use Topics  . Smoking status: Never Smoker   . Smokeless tobacco: Never Used  . Alcohol Use: No   OB History    Gravida Para Term Preterm AB TAB SAB Ectopic Multiple Living   3 1 1  2  2   1      Review of Systems  Musculoskeletal: Positive for myalgias. Negative for back pain, joint swelling, arthralgias, gait problem, neck pain and neck stiffness.  Skin: Negative for wound.      Allergies  Review of patient's allergies indicates no active allergies.  Home Medications   Prior to Admission medications   Medication Sig Start Date End Date Taking? Authorizing Provider  cephALEXin (KEFLEX) 500 MG capsule Take 1 capsule (500 mg total) by mouth 4 (four) times daily. 08/18/14   Margarita Mail, PA-C  cyclobenzaprine (FLEXERIL) 5 MG tablet Take 1 tablet (5 mg total) by mouth 3 (three) times daily as needed for muscle spasms. 05/07/15   Glendell Docker, NP  diazepam (VALIUM) 5 MG tablet Take 1 tablet (5 mg total) by mouth 2 (two) times daily. 06/11/15   Khrystina Bonnes Patel-Mills, PA-C  docusate sodium 100 MG CAPS Take 100 mg by mouth 2 (two) times daily. 06/28/14  Ascencion Dike, PA-C  fluconazole (DIFLUCAN) 150 MG tablet Take 1 tablet (150 mg total) by mouth once. 08/18/14   Margarita Mail, PA-C  HYDROcodone-acetaminophen (NORCO/VICODIN) 5-325 MG per tablet Take 1-2 tablets by mouth every 4 (four) hours as needed for moderate pain. 06/28/14   Ascencion Dike, PA-C  ibuprofen (ADVIL,MOTRIN) 600 MG tablet Take 1 tablet (600 mg total) by mouth 3 (three) times daily. 06/28/14   Ascencion Dike, PA-C  insulin glargine (LANTUS) 100 UNIT/ML injection Inject 10-50 Units into the skin 2 (two) times daily. TAKES 10-20 UNITS IN THE MORNING IF NEEDED AND 50 UNITS AT NIGHT    Historical Provider, MD  lisinopril  (PRINIVIL,ZESTRIL) 10 MG tablet Take 10 mg by mouth every evening.    Historical Provider, MD  oxyCODONE-acetaminophen (PERCOCET/ROXICET) 5-325 MG per tablet Take 2 tablets by mouth every 4 (four) hours as needed for severe pain. 06/11/15   Oneill Bais Patel-Mills, PA-C  promethazine (PHENERGAN) 25 MG tablet Take 1 tablet (25 mg total) by mouth every 6 (six) hours as needed for nausea. 06/28/14   Ascencion Dike, PA-C   BP 163/89 mmHg  Pulse 102  Temp(Src) 98.5 F (36.9 C) (Oral)  Resp 16  SpO2 100%  LMP 06/04/2015 Physical Exam  Constitutional: She is oriented to person, place, and time. She appears well-developed and well-nourished.  HENT:  Head: Normocephalic and atraumatic.  Eyes: Conjunctivae are normal.  Neck: Normal range of motion. Neck supple.  Cardiovascular: Normal rate.   Pulmonary/Chest: Effort normal.  Musculoskeletal: Normal range of motion.  She is able to straight leg raise bilaterally without pain. Hips are stable. NVI, normal sensation. She is able to dorsi flex and plantar flex without difficulty. No foot drop. Good DP pulses bilaterally. She has no calf tenderness or popliteal fossa tenderness to palpation. She has tenderness along the lateral right thigh.  Neurological: She is alert and oriented to person, place, and time.  Skin: Skin is warm and dry.  Nursing note and vitals reviewed.   ED Course  Procedures (including critical care time) Labs Review Labs Reviewed - No data to display  Imaging Review No results found.   EKG Interpretation None      MDM   Final diagnoses:  Right sciatic nerve pain  Patient presents for right upper leg pain that began over 3 weeks ago. She has tried prednisone injection, Toradol. She states the pain is even worse now and has an appointment with [facility] orthopedics on Friday but could not wait until then. I do not suspect a DVT since her tenderness is along the lateral thigh. I will give her Percocet for breakthrough pain and  Valium. I discussed with the patient that she should keep her appointment and verbally agrees with the plan.     Ottie Glazier, PA-C 06/12/15 [id number]  Lacretia Leigh, MD 06/12/15 2039

## 2015-06-11 NOTE — Discharge Instructions (Signed)
Back Exercises Keep your appointment with orthopedics on Friday. Back exercises help treat and prevent back injuries. The goal of back exercises is to increase the strength of your abdominal and back muscles and the flexibility of your back. These exercises should be started when you no longer have back pain. Back exercises include:  Pelvic Tilt. Lie on your back with your knees bent. Tilt your pelvis until the lower part of your back is against the floor. Hold this position 5 to 10 sec and repeat 5 to 10 times.  Knee to Chest. Pull first 1 knee up against your chest and hold for 20 to 30 seconds, repeat this with the other knee, and then both knees. This may be done with the other leg straight or bent, whichever feels better.  Sit-Ups or Curl-Ups. Bend your knees 90 degrees. Start with tilting your pelvis, and do a partial, slow sit-up, lifting your trunk only 30 to 45 degrees off the floor. Take at least 2 to 3 seconds for each sit-up. Do not do sit-ups with your knees out straight. If partial sit-ups are difficult, simply do the above but with only tightening your abdominal muscles and holding it as directed.  Hip-Lift. Lie on your back with your knees flexed 90 degrees. Push down with your feet and shoulders as you raise your hips a couple inches off the floor; hold for 10 seconds, repeat 5 to 10 times.  Back arches. Lie on your stomach, propping yourself up on bent elbows. Slowly press on your hands, causing an arch in your low back. Repeat 3 to 5 times. Any initial stiffness and discomfort should lessen with repetition over time.  Shoulder-Lifts. Lie face down with arms beside your body. Keep hips and torso pressed to floor as you slowly lift your head and shoulders off the floor. Do not overdo your exercises, especially in the beginning. Exercises may cause you some mild back discomfort which lasts for a few minutes; however, if the pain is more severe, or lasts for more than 15 minutes, do not  continue exercises until you see your caregiver. Improvement with exercise therapy for back problems is slow.  See your caregivers for assistance with developing a proper back exercise program. Document Released: 01/16/2005 Document Revised: 03/02/2012 Document Reviewed: 10/10/2011 [employer] [facility] [facility] [employer] [facility] Patient Information 2015 [place], [place]. This information is not intended to replace advice given to you by your health care provider. Make sure you discuss any questions you have with your health care provider.

## 2015-06-11 NOTE — ED Notes (Addendum)
Pt complains of pain and tingling in her right leg. Pt states the pain radiates from her left foot to her left buttock. Pt states she received a prednisone shot 3 weeks ago, which she states provided minimal relief. Pt denies any change in the color or swelling of her right leg. Pt states she has an appointment at [facility] orthopedic on Friday but cannot wait due to the pain.

## 2015-06-20 ENCOUNTER — Ambulatory Visit: Payer: 59 | Attending: Physician Assistant | Admitting: Physical Therapy

## 2015-06-20 DIAGNOSIS — R293 Abnormal posture: Secondary | ICD-10-CM | POA: Diagnosis not present

## 2015-06-20 DIAGNOSIS — M62830 Muscle spasm of back: Secondary | ICD-10-CM

## 2015-06-20 DIAGNOSIS — M545 Low back pain: Secondary | ICD-10-CM | POA: Insufficient documentation

## 2015-06-20 DIAGNOSIS — M5386 Other specified dorsopathies, lumbar region: Secondary | ICD-10-CM

## 2015-06-20 NOTE — Therapy (Signed)
[place], [place], [address] Phone: [phone]   Fax:  [phone]  Physical Therapy Evaluation  Patient Details  Name: Erica Nguyen MRN: [id number] Date of Birth: Dec 12, 1971 Referring Provider:  Jeri Cos, MD  Encounter Date: 06/20/2015      PT End of Session - 06/20/15 1316    Visit Number 1   Number of Visits 12   Date for PT Re-Evaluation 08/01/15   PT Start Time 0800   PT Stop Time 0845   PT Time Calculation (min) 45 min   Activity Tolerance Patient tolerated treatment well   Behavior During Therapy [facility] for tasks assessed/performed      Past Medical History  Diagnosis Date  . Hypertension     chronic hypertension  . Diabetes mellitus     insulin-dependent  . Abnormal Pap smear of vagina   . History of anemia   . Polyhydramnios   . History of varicella     Childhood Varicell  . Fibroid   . Abnormal Pap smear     Past Surgical History  Procedure Laterality Date  . Cardiac catheterization  02/29/2008    Normal coronary arteries -- Normal LV (left ventricular) systolic function -- Mild to moderate elevation in left ventricular end-diastolic  pressure secondary to hypertension, diabetes and obesity  . Novasure ablation  01/08/2008  . Cholecystectomy  1995  . Hysteroscopy, d&c, novasure ablation, removal of intrauterine device  01/08/2008  . Gynecologic cryosurgery  1998    There were no vitals filed for this visit.  Visit Diagnosis:  Right low back pain, with sciatica presence unspecified - Plan: PT plan of care cert/re-cert  Decreased ROM of lumbar spine - Plan: PT plan of care cert/re-cert  Muscle spasm of back - Plan: PT plan of care cert/re-cert  Abnormal posture - Plan: PT plan of care cert/re-cert      Subjective Assessment - 06/20/15 0809    Subjective pt is a 44 y.o F with CC of R sided low back pain that goes down the R leg into the calf that started insidiously the  last couple months, she reports having an issue with this before but didn't do PT. she reports since onset it got better wiht the predisone and then it got worse.    Limitations Sitting;Lifting;Walking;Standing;House hold activities   How long can you sit comfortably? 10 min   How long can you stand comfortably? 5-10 min   How long can you walk comfortably? 5-10 min   Diagnostic tests 06/16/2015 X-ray per pt report there was a disc bulge   Patient Stated Goals To get out of pain and get my life back   Currently in Pain? Yes   Pain Score 5   this 2 am.   Pain Location Back   Pain Orientation Right   Pain Descriptors / Indicators Sore   Pain Type Chronic pain   Pain Radiating Towards down the R leg to the posterior calf   Pain Onset More than a month ago   Pain Frequency Constant   Aggravating Factors  bending forward,    Pain Relieving Factors pain medication, heating pad,    Multiple Pain Sites Yes   Pain Score 10   Pain Location Leg   Pain Orientation Right   Pain Descriptors / Indicators Tightness;Aching;Throbbing   Pain Type Chronic pain   Pain Onset More than a month ago   Pain Frequency Constant   Aggravating Factors  bending,  walking   Pain Relieving Factors pain medication            [facility] PT Assessment - 06/20/15 0818    Assessment   Medical Diagnosis R sided low back pain with R LE sciatica   Onset Date/Surgical Date --  2 months   Hand Dominance Right   Next MD Visit 07/14/2015   Prior Therapy no   Precautions   Precautions None   Restrictions   Weight Bearing Restrictions No   Balance Screen   Has the patient fallen in the past 6 months No   Has the patient had a decrease in activity level because of a fear of falling?  No   Is the patient reluctant to leave their home because of a fear of falling?  No   Home Environment   Living Environment Private residence   Living Arrangements Children   Available Help at Discharge Available  PRN/intermittently;Available 24 hours/day   Type of Home Apartment   Home Access Stairs to enter   Entrance Stairs-Number of Steps 1   Home Layout Two level   Alternate Level Stairs-Number of Steps 11   Prior Function   Level of Independence Independent;Independent with basic ADLs   Vocation Full time employment  customer service at [employer] Requirements prolonged sitting, standing,    Leisure go to the movies, go shopping, hanging out with family   Cognition   Overall Cognitive Status Within Functional Limits for tasks assessed   Observation/Other Assessments   Focus on Therapeutic Outcomes (FOTO)  51% limitated  predicted 33%   Posture/Postural Control   Posture/Postural Control Postural limitations   Postural Limitations Rounded Shoulders;Forward head;Decreased lumbar lordosis   ROM / Strength   AROM / PROM / Strength AROM;Strength   AROM   AROM Assessment Site Lumbar   Lumbar Flexion 70  reporduction of pain into leg at endrange   Lumbar Extension 15   Lumbar - Right Side Bend 22   Lumbar - Left Side Bend 40   Lumbar - Right Rotation 40%   Lumbar - Left Rotation 50%   Strength   Overall Strength Within functional limits for tasks performed   Palpation   Spinal mobility intervertebral hypomobility at L1-L5 with pain and reproduciton of symtoms to the RLE   Palpation comment spasm and pain upon palpation at the R gluteal, piriformis, and R lumbar paraspinals    Special Tests    Special Tests Lumbar   Lumbar Tests Slump Test;Straight Leg Raise;Prone Knee Bend Test   Slump test   Findings Positive   Side Right                   [facility] Adult PT Treatment/Exercise - 06/20/15 0818    Lumbar Exercises: Stretches   Passive Hamstring Stretch 2 reps;30 seconds   Piriformis Stretch 2 reps;30 seconds   Lumbar Exercises: Prone   Other Prone Lumbar Exercises prone press-ups 2 x 10                PT Education - 06/20/15 1316    Education provided  Yes   Education Details evaluation findings, POC, goals, HEP, anatomical explanation   Person(s) Educated Patient   Methods Explanation   Comprehension Verbalized understanding          PT Short Term Goals - 06/20/15 1326    PT SHORT TERM GOAL #1   Title pt will be I with basic HEP (07/11/2015)   Time 3  Period Weeks   Status New   PT SHORT TERM GOAL #2   Title She will increase her FOTO score by >  8 points to assist with increased functional capacity (07/11/2015)   Time 3   Period Weeks   Status New           PT Long Term Goals - 06/20/15 1327    PT LONG TERM GOAL #1   Title upon discharge she will be I with all exercises given throughout therapy (08/01/2015)   Time 6   Period Weeks   Status New   PT LONG TERM GOAL #2   Title She will dmeonstrate < 3/10 pain during and following sitting/ standing and walking for > 30-45 minutes to assist with work related activities 08/01/2015   Time 6   Period Weeks   Status New   PT LONG TERM GOAL #3   Title She will demonstrate no low back muscle spasm to assist with improved trunk mobility by > 10 degrees in all planes 08/01/2015   Time 6   Period Weeks   Status New   PT LONG TERM GOAL #4   Title She will be able to verbalize and dmeonstrate techniques to reduce risk of low back reinjury via postural awareness. lifting and carrying mechanics and HEP 08/01/2015   Time 6   Period Weeks   Status New   PT LONG TERM GOAL #5   Title pt will increase FOTO score to > 67 upon discharge to assist with improved functional capacity 08/01/2015   Time 6   Period Weeks   Status New               Plan - 06/20/15 1317    Clinical Impression Statement Chaela presents to OPPT with CC of low back pain with referral of symptoms down her R LE to the calf. She had gotten cortizone shot, and predisnose but the pain hasn't decreased. She demosntrates limited trunk mobility with pain production down the leg with flexion, and pinching with extension.  She exhibits a direction specific preference with repeated extensions. She is hypomobilie at L1-L5 with spasms of the glutes/pirifomris and lumbar parapsinals. bil LE strength was [facility]. She exhibits a postive slump test indicating sciatic nerve involvment. following prone press ups and pirifomris stretching she reported pain dropped from 10/10 to 6/10. She would benefit from skilled physical therapy to maxmize her funciton by addressing the impairments listed.    Pt will benefit from skilled therapeutic intervention in order to improve on the following deficits Decreased activity tolerance;Decreased endurance;Decreased range of motion;Hypomobility;Pain;Postural dysfunction;Improper body mechanics;Increased muscle spasms;Difficulty walking   Rehab Potential Good   PT Frequency 2x / week   PT Duration 6 weeks   PT Treatment/Interventions ADLs/Self Care Home Management;Cryotherapy;Electrical Stimulation;Moist Heat;Traction;Ultrasound;Therapeutic activities;Therapeutic exercise;Neuromuscular re-education;Patient/family education;Manual techniques;Dry needling;Taping;Passive range of motion   PT Next Visit Plan assess reponse to HEP, manual, lumbar stretching, modalities PRN,    PT Home Exercise Plan prone press ups, hamstring and pirifomris stretch, pelvic titls   Consulted and Agree with Plan of Care Patient         Problem List Patient Active Problem List   Diagnosis Date Noted  . Fibroids, intramural 06/27/2014  . Chest pain 01/06/2012  . Dyspnea 01/06/2012  . DIABETES MELLITUS, TYPE II 09/25/2007  . HYPERTENSION 09/25/2007   Starr Lake PT, DPT, LAT, ATC  06/20/2015  1:37 PM    [place], [place],  [place] Phone: [phone]   Fax:  [phone]

## 2015-06-20 NOTE — Patient Instructions (Signed)
   Kristoffer Leamon PT, DPT, LAT, ATC  [facility] Phone: [phone]     

## 2015-06-22 ENCOUNTER — Ambulatory Visit: Payer: 59 | Admitting: Physical Therapy

## 2015-06-22 DIAGNOSIS — M5386 Other specified dorsopathies, lumbar region: Secondary | ICD-10-CM

## 2015-06-22 DIAGNOSIS — M62830 Muscle spasm of back: Secondary | ICD-10-CM

## 2015-06-22 DIAGNOSIS — M545 Low back pain: Secondary | ICD-10-CM

## 2015-06-22 DIAGNOSIS — R293 Abnormal posture: Secondary | ICD-10-CM

## 2015-06-22 NOTE — Therapy (Addendum)
[place], [place], [address] Phone: [phone]   Fax:  [phone]  Physical Therapy Treatment  Patient Details  Name: Erica Nguyen MRN: [id number] Date of Birth: October 27, 1971 Referring Provider:  Jeri Cos, MD  Encounter Date: 06/22/2015      PT End of Session - 06/22/15 0902    Visit Number 2   Number of Visits 12   Date for PT Re-Evaluation 08/01/15   PT Start Time 0810   PT Stop Time 0905   PT Time Calculation (min) 55 min   Activity Tolerance Patient tolerated treatment well   Behavior During Therapy [facility] for tasks assessed/performed      Past Medical History  Diagnosis Date  . Hypertension     chronic hypertension  . Diabetes mellitus     insulin-dependent  . Abnormal Pap smear of vagina   . History of anemia   . Polyhydramnios   . History of varicella     Childhood Varicell  . Fibroid   . Abnormal Pap smear     Past Surgical History  Procedure Laterality Date  . Cardiac catheterization  02/29/2008    Normal coronary arteries -- Normal LV (left ventricular) systolic function -- Mild to moderate elevation in left ventricular end-diastolic  pressure secondary to hypertension, diabetes and obesity  . Novasure ablation  01/08/2008  . Cholecystectomy  1995  . Hysteroscopy, d&c, novasure ablation, removal of intrauterine device  01/08/2008  . Gynecologic cryosurgery  1998    There were no vitals filed for this visit.  Visit Diagnosis:  Right low back pain, with sciatica presence unspecified  Decreased ROM of lumbar spine  Muscle spasm of back  Abnormal posture      Subjective Assessment - 06/22/15 0809    Subjective "my pain has been more off and on, I have been doing the exercises but have been having trouble doing the pelvic tilts"   Currently in Pain? Yes   Pain Score 7    Pain Location Back   Pain Orientation Right   Pain Descriptors / Indicators Aching;Sore   Pain  Type Chronic pain   Pain Onset More than a month ago   Pain Frequency Constant                         [facility] Adult PT Treatment/Exercise - 06/22/15 0821    Lumbar Exercises: Stretches   Passive Hamstring Stretch 2 reps;30 seconds   Single Knee to Chest Stretch 2 reps;30 seconds   Lower Trunk Rotation 5 reps;10 seconds   Pelvic Tilt 5 reps;10 seconds   Piriformis Stretch 2 reps;30 seconds   Lumbar Exercises: Aerobic   Stationary Bike Nu Step L4 x 5 min   Lumbar Exercises: Supine   Clam 10 reps   Bent Knee Raise 10 reps   Bridge 10 reps  2 sets   Lumbar Exercises: Prone   Other Prone Lumbar Exercises prone press-ups 4 x 10   Modalities   Modalities Moist Heat   Moist Heat Therapy   Number Minutes Moist Heat 10 Minutes   Moist Heat Location Lumbar Spine  in prone   Manual Therapy   Manual Therapy Joint mobilization;Myofascial release   Joint Mobilization grade 2-3 L1-L5 P/A   Myofascial Release STM/DTM over bil lumbar parapspinals, trigger point release over R pirformis                PT Education - 06/22/15  29    Education provided Yes   Education Details posture education, lower trunk rotation   Person(s) Educated Patient   Methods Explanation   Comprehension Verbalized understanding          PT Short Term Goals - 06/20/15 1326    PT SHORT TERM GOAL #1   Title pt will be I with basic HEP (07/11/2015)   Time 3   Period Weeks   Status New   PT SHORT TERM GOAL #2   Title She will increase her FOTO score by >  8 points to assist with increased functional capacity (07/11/2015)   Time 3   Period Weeks   Status New           PT Long Term Goals - 06/20/15 1327    PT LONG TERM GOAL #1   Title upon discharge she will be I with all exercises given throughout therapy (08/01/2015)   Time 6   Period Weeks   Status New   PT LONG TERM GOAL #2   Title She will dmeonstrate < 3/10 pain during and following sitting/ standing and walking for >  30-45 minutes to assist with work related activities 08/01/2015   Time 6   Period Weeks   Status New   PT LONG TERM GOAL #3   Title She will demonstrate no low back muscle spasm to assist with improved trunk mobility by > 10 degrees in all planes 08/01/2015   Time 6   Period Weeks   Status New   PT LONG TERM GOAL #4   Title She will be able to verbalize and dmeonstrate techniques to reduce risk of low back reinjury via postural awareness. lifting and carrying mechanics and HEP 08/01/2015   Time 6   Period Weeks   Status New   PT LONG TERM GOAL #5   Title pt will increase FOTO score to > 67 upon discharge to assist with improved functional capacity 08/01/2015   Time 6   Period Weeks   Status New               Plan - 06/22/15 6629    Clinical Impression Statement Donetta Potts reports that she is doing better today since the last visit with pain at a 7/10 compared to last visit at a 10/10. she tolerated all exercises with report ofthe pain feeling better in the low back and leg. she states she only feels the pain to her glute region now and doesn't go any farther. educated on posture and added lower trunk rotation to her HEP.   PT Next Visit Plan  manual,glute/piriformis stretching, modalities PRN, core strengthening, prone press-ups (may progress to with overpressure/ or standing as tolerated)   PT Home Exercise Plan lower trunk rotation, posture education   Consulted and Agree with Plan of Care Patient        Problem List Patient Active Problem List   Diagnosis Date Noted  . Fibroids, intramural 06/27/2014  . Chest pain 01/06/2012  . Dyspnea 01/06/2012  . DIABETES MELLITUS, TYPE II 09/25/2007  . HYPERTENSION 09/25/2007   Starr Lake PT, DPT, LAT, ATC  06/22/2015  9:12 AM    [facility] [address] [place], [place], [address] Phone: [phone]   Fax:  [phone]                PHYSICAL THERAPY  DISCHARGE SUMMARY  Visits from Start of Care: 2  Current functional level related to goals / functional  outcomes: FOTO 51% limitation   Remaining deficits: See goals   Education / Equipment: HEP handout and theraband for exercises  Plan:                                                    Patient goals were not met. Patient is being discharged due to not returning since the last visit.  ?????  Patient did not return after 2nd visit and was unable to be reached by phone because wouldn't answer and couldn't leave a message due to voicemail not being set up.        Kristoffer Leamon PT, DPT, LAT, ATC  07/18/2015  7:50 AM

## 2015-06-22 NOTE — Patient Instructions (Addendum)

## 2015-07-04 ENCOUNTER — Ambulatory Visit: Payer: 59 | Attending: Physician Assistant | Admitting: Physical Therapy

## 2015-07-06 ENCOUNTER — Ambulatory Visit: Payer: 59 | Admitting: Physical Therapy

## 2015-07-06 ENCOUNTER — Telehealth: Payer: Self-pay | Admitting: Physical Therapy

## 2015-07-06 NOTE — Telephone Encounter (Signed)
Attempted to leave message about missing appointments, no answer and voicemail wasn't set up so couldn't leave a message.

## 2015-07-11 ENCOUNTER — Ambulatory Visit: Payer: 59 | Admitting: Physical Therapy

## 2015-07-13 ENCOUNTER — Ambulatory Visit: Payer: 59 | Admitting: Physical Therapy

## 2015-07-17 ENCOUNTER — Telehealth: Payer: Self-pay | Admitting: Physical Therapy

## 2015-07-17 NOTE — Telephone Encounter (Signed)
Attempted to call patient regarding 4 no show appointments. Unable to reach patient to confirm next appointment. Voicemail is not set up. Due to 4 no shows and inability to contact patient, I will cancel future appointments and forward chart to PT for discharge.

## 2015-07-18 ENCOUNTER — Ambulatory Visit: Payer: 59 | Admitting: Physical Therapy

## 2015-07-20 ENCOUNTER — Ambulatory Visit: Payer: 59 | Admitting: Physical Therapy

## 2015-07-25 ENCOUNTER — Encounter: Payer: 59 | Admitting: Physical Therapy

## 2015-07-27 ENCOUNTER — Encounter: Payer: 59 | Admitting: Physical Therapy

## 2015-09-25 ENCOUNTER — Ambulatory Visit: Payer: Self-pay | Admitting: Orthopedic Surgery

## 2015-10-15 DIAGNOSIS — IMO0001 Reserved for inherently not codable concepts without codable children: Secondary | ICD-10-CM | POA: Insufficient documentation

## 2015-11-14 ENCOUNTER — Ambulatory Visit: Payer: Self-pay | Admitting: Orthopedic Surgery

## 2015-11-14 NOTE — H&P (Signed)
Erica Nguyen is an 44 y.o. female.   Chief Complaint: back and R leg pain HPI: The patient is a 44 year old female who presents with back pain. The patient is here today in referral from Dr. Nelva Nguyen. The patient reports low back symptoms including pain which began 3 month(s) ago without any known injury. and Symptoms include tingling (right foot). The pain radiates to the right buttock, right posterior thigh and right posterior lower leg. The patient describes the pain as aching and throbbing. The patient describes the severity of their symptoms as 6 / 10. Symptoms are exacerbated by standing, sitting and recumbency. Symptoms are relieved by opioid analgesics (some relief). Current treatment includes nonsteroidal anti-inflammatory drugs (Ibuprofen), opioid analgesics (Norco) and heating pad. Prior to being seen today the patient was previously evaluated by Dr. Nelva Nguyen. Past evaluation has included x-ray of the lumbar spine and MRI of the lumbar spine. Past treatment has included nonsteroidal anti-inflammatory drugs, opioid analgesics (Norco), corticosteroids, physical therapy, heating pad and right S1 SNRB.  Past Medical History  Diagnosis Date  . Hypertension     chronic hypertension  . Diabetes mellitus     insulin-dependent  . Abnormal Pap smear of vagina   . History of anemia   . Polyhydramnios   . History of varicella     Childhood Varicell  . Fibroid   . Abnormal Pap smear     Past Surgical History  Procedure Laterality Date  . Cardiac catheterization  02/29/2008    Normal coronary arteries -- Normal LV (left ventricular) systolic function -- Mild to moderate elevation in left ventricular end-diastolic  pressure secondary to hypertension, diabetes and obesity  . Novasure ablation  01/08/2008  . Cholecystectomy  1995  . Hysteroscopy, d&c, novasure ablation, removal of intrauterine device  01/08/2008  . Gynecologic cryosurgery  1998    Family History  Problem Relation Age of Onset  .  Heart disease Mother   . Diabetes Mother   . Hypertension Father   . Diabetes Father   . Kidney failure Father   . Stroke Father   . Hypertension Paternal Grandmother   . Diabetes Paternal Grandmother   . Stroke Paternal Grandmother   . Diabetes Maternal Grandmother   . Ovarian cancer Maternal Grandmother   . Diabetes Maternal Grandfather   . Diabetes Paternal Grandfather    Social History:  reports that she has never smoked. She has never used smokeless tobacco. She reports that she does not drink alcohol or use illicit drugs.  Allergies:  Allergies  Allergen Reactions  . Amoxicillin Other (See Comments)    Yeast Infection with AMOXIL Use Give pt yeast infection     (Not in a hospital admission)  No results found for this or any previous visit (from the past 48 hour(s)). No results found.  Review of Systems  Constitutional: Negative.   HENT: Negative.   Eyes: Negative.   Respiratory: Negative.   Cardiovascular: Negative.   Gastrointestinal: Negative.   Genitourinary: Negative.   Musculoskeletal: Positive for back pain.  Skin: Negative.   Neurological: Positive for sensory change and focal weakness.    There were no vitals taken for this visit. Physical Exam  Constitutional: She is oriented to person, place, and time. She appears well-developed.  HENT:  Head: Normocephalic.  Eyes: Pupils are equal, round, and reactive to light.  Neck: Normal range of motion.  Respiratory: Effort normal.  GI: Soft.  Musculoskeletal:  On exam, moderate distress. Mood and affect is appropriate  with a slight antalgic gait. Straight leg raise on the right produces buttock, thigh, and calf pain; on the left, negative. EHLs 4+/5 on the right. On the left, decreased sensation in the L5-S1 dermatome. Pain with extension.  Lumbar spine exam reveals no evidence of soft tissue swelling, deformity or skin ecchymosis. On palpation there is no tenderness of the lumbar spine. No flank pain with  percussion. The abdomen is soft and nontender. Nontender over the trochanters. No cellulitis or lymphadenopathy.  Good range of motion of the lumbar spine without associated pain. Motor is 5/5 including tibialis anterior, plantar flexion, quadriceps and hamstrings. Patient is normoreflexic. There is no Babinski or clonus. Sensory exam is intact to light touch. Patient has good distal pulses. No DVT. No pain and normal range of motion without instability of the hips, knees and ankles.  Neurological: She is alert and oriented to person, place, and time.  Skin: Skin is warm and dry.    MRI demonstrates large disc herniation paracentral to the right displacing the S1 nerve root.  Three-view x-rays, AP and lateral flexion and extension demonstrates disc space narrowing at L5-S1. No instability on flexion and extension.  Assessment/Plan 1. S1 radiculopathy and myotomal weakness, dermatomal dysesthesias secondary to disc herniation at L5-S1 despite rest, activity, modification, on PT and epidural. 2. Non-insulin-dependent diabetes.  We had an extensive discussion with [name] concerning current pathology, relevant anatomy, and treatment options at this point in time. Given the presence of neural tension signs and neurologic deficit and persistent pain despite conservative treatment, it is reasonable to proceed with a lumbar decompression at this point. I had an extensive discussion of the risks and benefits of the lumbar decompression with the patient including bleeding, infection, damage to neurovascular structures, epidural fibrosis, CSF leak requiring repair. We also discussed increase in pain, adjacent segment disease, recurrent disc herniation, need for future surgery including repeat decompression and/or fusion. We also discussed risks of postoperative hematoma, paralysis, anesthetic complications including DVT, PE, death, cardiopulmonary dysfunction. In addition, the perioperative and postoperative  courses were discussed in detail including the rehabilitative time and return to functional activity and work. I provided the patient with an illustrated handout and utilized the appropriate surgical models.  Overnight in the hospital, sutures out in two weeks. We discussed maximum medical improvement perhaps six weeks postop. 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 She reported increased in her blood sugars following the injection. We will avoid steroids at this point. Continue with her Norco. Activity modification, strategies to avoid re-injury. I appreciate the kind referral by Dr. Nelva Nguyen.  Plan microlumbar decompression L5-S1 right  BISSELL, JACLYN M. PA-C for Dr. Tonita Cong 11/14/2015, 4:50 PM

## 2015-11-14 NOTE — Patient Instructions (Addendum)
YOUR PROCEDURE IS SCHEDULED ON :  11/22/15  REPORT TO [facility] MAIN ENTRANCE FOLLOW SIGNS TO EAST ELEVATOR - GO TO 3rd FLOOR CHECK IN AT 3 EAST NURSES STATION (SHORT STAY) AT: 5:30 AM  CALL THIS NUMBER IF YOU HAVE PROBLEMS THE MORNING OF SURGERY [phone]  REMEMBER:ONLY 1 PER PERSON MAY GO TO SHORT STAY WITH YOU TO GET READY THE MORNING OF YOUR SURGERY  DO NOT EAT FOOD OR DRINK LIQUIDS AFTER MIDNIGHT  TAKE THESE MEDICINES THE MORNING OF SURGERY: MAY TAKE HYDROCODONE FOR PAIN / TAKE ONLY 1/2 DOSE OF LEVEMIR INSULIN THE NIGHT BEFORE SURGERY  YOU MAY NOT HAVE ANY METAL ON YOUR BODY INCLUDING HAIR PINS AND PIERCING'S. DO NOT WEAR JEWELRY, MAKEUP, LOTIONS, POWDERS OR PERFUMES. DO NOT WEAR NAIL POLISH. DO NOT SHAVE 48 HRS PRIOR TO SURGERY. MEN MAY SHAVE FACE AND NECK.  DO NOT [place]. [facility] IS NOT RESPONSIBLE FOR VALUABLES.  CONTACTS, DENTURES OR PARTIALS MAY NOT BE WORN TO SURGERY. LEAVE SUITCASE IN CAR. CAN BE BROUGHT TO ROOM AFTER SURGERY.  PATIENTS DISCHARGED THE DAY OF SURGERY WILL NOT BE ALLOWED TO DRIVE HOME.  PLEASE READ OVER THE FOLLOWING INSTRUCTION SHEETS _________________________________________________________________________________                                          [facility] - PREPARING FOR SURGERY  Before surgery, you can play an important role.  Because skin is not sterile, your skin needs to be as free of germs as possible.  You can reduce the number of germs on your skin by washing with CHG (chlorahexidine gluconate) soap before surgery.  CHG is an antiseptic cleaner which kills germs and bonds with the skin to continue killing germs even after washing. Please DO NOT use if you have an allergy to CHG or antibacterial soaps.  If your skin becomes reddened/irritated stop using the CHG and inform your nurse when you arrive at Short Stay. Do not shave (including legs and underarms) for at least 48 hours prior to the  first CHG shower.  You may shave your face. Please follow these instructions carefully:   1.  Shower with CHG Soap the night before surgery and the  morning of Surgery.   2.  If you choose to wash your hair, wash your hair first as usual with your  normal  Shampoo.   3.  After you shampoo, rinse your hair and body thoroughly to remove the  shampoo.                                         4.  Use CHG as you would any other liquid soap.  You can apply chg directly  to the skin and wash . Gently wash with scrungie or clean wascloth    5.  Apply the CHG Soap to your body ONLY FROM THE NECK DOWN.   Do not use on open                           Wound or open sores. Avoid contact with eyes, ears mouth and genitals (private parts).  Genitals (private parts) with your normal soap.              6.  Wash thoroughly, paying special attention to the area where your surgery  will be performed.   7.  Thoroughly rinse your body with warm water from the neck down.   8.  DO NOT shower/wash with your normal soap after using and rinsing off  the CHG Soap .                9.  Pat yourself dry with a clean towel.             10.  Wear clean night clothes to bed after shower             11.  Place clean sheets on your bed the night of your first shower and do not  sleep with pets.  Day of Surgery : Do not apply any lotions/deodorants the morning of surgery.  Please wear clean clothes to the hospital/surgery center.  FAILURE TO FOLLOW THESE INSTRUCTIONS MAY RESULT IN THE CANCELLATION OF YOUR SURGERY    PATIENT SIGNATURE_________________________________  ______________________________________________________________________     Adam Phenix  An incentive spirometer is a tool that can help keep your lungs clear and active. This tool measures how well you are filling your lungs with each breath. Taking long deep breaths may help reverse or decrease the chance of  developing breathing (pulmonary) problems (especially infection) following:  A long period of time when you are unable to move or be active. BEFORE THE PROCEDURE   If the spirometer includes an indicator to show your best effort, your nurse or respiratory therapist will set it to a desired goal.  If possible, sit up straight or lean slightly forward. Try not to slouch.  Hold the incentive spirometer in an upright position. INSTRUCTIONS FOR USE  1. Sit on the edge of your bed if possible, or sit up as far as you can in bed or on a chair. 2. Hold the incentive spirometer in an upright position. 3. Breathe out normally. 4. Place the mouthpiece in your mouth and seal your lips tightly around it. 5. Breathe in slowly and as deeply as possible, raising the piston or the ball toward the top of the column. 6. Hold your breath for 3-5 seconds or for as long as possible. Allow the piston or ball to fall to the bottom of the column. 7. Remove the mouthpiece from your mouth and breathe out normally. 8. Rest for a few seconds and repeat Steps 1 through 7 at least 10 times every 1-2 hours when you are awake. Take your time and take a few normal breaths between deep breaths. 9. The spirometer may include an indicator to show your best effort. Use the indicator as a goal to work toward during each repetition. 10. After each set of 10 deep breaths, practice coughing to be sure your lungs are clear. If you have an incision (the cut made at the time of surgery), support your incision when coughing by placing a pillow or rolled up towels firmly against it. Once you are able to get out of bed, walk around indoors and cough well. You may stop using the incentive spirometer when instructed by your caregiver.  RISKS AND COMPLICATIONS  Take your time so you do not get dizzy or light-headed.  If you are in pain, you may need to take or ask for pain medication before doing incentive spirometry. It is  harder to take a  deep breath if you are having pain. AFTER USE  Rest and breathe slowly and easily.  It can be helpful to keep track of a log of your progress. Your caregiver can provide you with a simple table to help with this. If you are using the spirometer at home, follow these instructions: [place] IF:   You are having difficultly using the spirometer.  You have trouble using the spirometer as often as instructed.  Your pain medication is not giving enough relief while using the spirometer.  You develop fever of 100.5 F (38.1 C) or higher. SEEK IMMEDIATE MEDICAL CARE IF:   You cough up bloody sputum that had not been present before.  You develop fever of 102 F (38.9 C) or greater.  You develop worsening pain at or near the incision site. MAKE SURE YOU:   Understand these instructions.  Will watch your condition.  Will get help right away if you are not doing well or get worse. Document Released: 04/21/2007 Document Revised: 03/02/2012 Document Reviewed: 06/22/2007 [facility] Patient Information 2014 [place], [place].   ________________________________________________________________________

## 2015-11-15 ENCOUNTER — Encounter (HOSPITAL_COMMUNITY): Payer: Self-pay

## 2015-11-15 ENCOUNTER — Encounter (HOSPITAL_COMMUNITY)
Admission: RE | Admit: 2015-11-15 | Discharge: 2015-11-15 | Disposition: A | Discharge status: Home / Self Care | Payer: Managed Care, Other (non HMO) | Source: Ambulatory Visit | Attending: Specialist | Admitting: Specialist

## 2015-11-15 ENCOUNTER — Ambulatory Visit (HOSPITAL_COMMUNITY)
Admission: RE | Admit: 2015-11-15 | Discharge: 2015-11-15 | Disposition: A | Discharge status: Home / Self Care | Payer: Managed Care, Other (non HMO) | Source: Ambulatory Visit | Attending: Orthopedic Surgery | Admitting: Orthopedic Surgery

## 2015-11-15 DIAGNOSIS — M5137 Other intervertebral disc degeneration, lumbosacral region: Secondary | ICD-10-CM | POA: Insufficient documentation

## 2015-11-15 DIAGNOSIS — E119 Type 2 diabetes mellitus without complications: Secondary | ICD-10-CM | POA: Diagnosis not present

## 2015-11-15 DIAGNOSIS — M5126 Other intervertebral disc displacement, lumbar region: Secondary | ICD-10-CM | POA: Insufficient documentation

## 2015-11-15 HISTORY — DX: Reserved for concepts with insufficient information to code with codable children: IMO0002

## 2015-11-15 HISTORY — DX: Family history of other specified conditions: Z84.89

## 2015-11-15 LAB — BASIC METABOLIC PANEL
ANION GAP: 9 (ref 5–15)
BUN: 13 mg/dL (ref 6–20)
CHLORIDE: 100 mmol/L — AB (ref 101–111)
CO2: 26 mmol/L (ref 22–32)
Calcium: 9.2 mg/dL (ref 8.9–10.3)
Creatinine, Ser: 0.6 mg/dL (ref 0.44–1.00)
GFR calc Af Amer: >60 mL/min (ref >60)
Glucose, Bld: 323 mg/dL — ABNORMAL HIGH (ref 65–99)
POTASSIUM: 3.8 mmol/L (ref 3.5–5.1)
SODIUM: 135 mmol/L (ref 135–145)

## 2015-11-15 LAB — CBC
HEMATOCRIT: 37.7 % (ref 36.0–46.0)
HEMOGLOBIN: 13.2 g/dL (ref 12.0–15.0)
MCH: 30 pg (ref 26.0–34.0)
MCHC: 35 g/dL (ref 30.0–36.0)
MCV: 85.7 fL (ref 78.0–100.0)
Platelets: 264 10*3/uL (ref 150–400)
RBC: 4.4 MIL/uL (ref 3.87–5.11)
RDW: 12.8 % (ref 11.5–15.5)
WBC: 10.3 10*3/uL (ref 4.0–10.5)

## 2015-11-15 LAB — HCG, SERUM, QUALITATIVE: PREG SERUM: NEGATIVE

## 2015-11-15 LAB — SURGICAL PCR SCREEN
MRSA, PCR: POSITIVE — AB
STAPHYLOCOCCUS AUREUS: POSITIVE — AB

## 2015-11-15 NOTE — Progress Notes (Signed)
Abnormal BMET faxed to Dr. Beane 

## 2015-11-15 NOTE — Progress Notes (Signed)
RX MUPURICIN called to [place]  PT NOTIFIED

## 2015-11-16 LAB — HEMOGLOBIN A1C
HEMOGLOBIN A1C: 10.5 % — AB (ref 4.8–5.6)
MEAN PLASMA GLUCOSE: 255 mg/dL

## 2015-11-20 ENCOUNTER — Ambulatory Visit: Payer: Self-pay | Admitting: Orthopedic Surgery

## 2015-11-22 ENCOUNTER — Ambulatory Visit (HOSPITAL_COMMUNITY): Payer: Managed Care, Other (non HMO)

## 2015-11-22 ENCOUNTER — Ambulatory Visit (HOSPITAL_COMMUNITY): Payer: Managed Care, Other (non HMO) | Admitting: Anesthesiology

## 2015-11-22 ENCOUNTER — Encounter (HOSPITAL_COMMUNITY): Payer: Self-pay | Admitting: *Deleted

## 2015-11-22 ENCOUNTER — Encounter (HOSPITAL_COMMUNITY)
Admission: RE | Disposition: A | Discharge status: Home / Self Care | Payer: Self-pay | Source: Ambulatory Visit | Attending: Specialist

## 2015-11-22 ENCOUNTER — Ambulatory Visit (HOSPITAL_COMMUNITY)
Admission: RE | Admit: 2015-11-22 | Discharge: 2015-11-23 | Disposition: A | Discharge status: Home / Self Care | Payer: Managed Care, Other (non HMO) | Source: Ambulatory Visit | Attending: Specialist | Admitting: Specialist

## 2015-11-22 DIAGNOSIS — E669 Obesity, unspecified: Secondary | ICD-10-CM | POA: Diagnosis not present

## 2015-11-22 DIAGNOSIS — Z880 Allergy status to penicillin: Secondary | ICD-10-CM | POA: Insufficient documentation

## 2015-11-22 DIAGNOSIS — I10 Essential (primary) hypertension: Secondary | ICD-10-CM | POA: Insufficient documentation

## 2015-11-22 DIAGNOSIS — M48061 Spinal stenosis, lumbar region without neurogenic claudication: Secondary | ICD-10-CM | POA: Diagnosis present

## 2015-11-22 DIAGNOSIS — Z6838 Body mass index (BMI) 38.0-38.9, adult: Secondary | ICD-10-CM | POA: Insufficient documentation

## 2015-11-22 DIAGNOSIS — M4807 Spinal stenosis, lumbosacral region: Secondary | ICD-10-CM | POA: Insufficient documentation

## 2015-11-22 DIAGNOSIS — M5136 Other intervertebral disc degeneration, lumbar region: Secondary | ICD-10-CM | POA: Insufficient documentation

## 2015-11-22 DIAGNOSIS — M5126 Other intervertebral disc displacement, lumbar region: Secondary | ICD-10-CM | POA: Insufficient documentation

## 2015-11-22 DIAGNOSIS — Z794 Long term (current) use of insulin: Secondary | ICD-10-CM | POA: Diagnosis not present

## 2015-11-22 DIAGNOSIS — Z419 Encounter for procedure for purposes other than remedying health state, unspecified: Secondary | ICD-10-CM

## 2015-11-22 DIAGNOSIS — E119 Type 2 diabetes mellitus without complications: Secondary | ICD-10-CM | POA: Diagnosis not present

## 2015-11-22 HISTORY — PX: DECOMPRESSIVE LUMBAR LAMINECTOMY LEVEL 1: SHX5791

## 2015-11-22 LAB — GLUCOSE, CAPILLARY
GLUCOSE-CAPILLARY: 157 mg/dL — AB (ref 65–99)
GLUCOSE-CAPILLARY: 372 mg/dL — AB (ref 65–99)
Glucose-Capillary: 161 mg/dL — ABNORMAL HIGH (ref 65–99)
Glucose-Capillary: 317 mg/dL — ABNORMAL HIGH (ref 65–99)

## 2015-11-22 SURGERY — DECOMPRESSIVE LUMBAR LAMINECTOMY LEVEL 1
Anesthesia: General | Site: Spine Lumbar | Laterality: Right

## 2015-11-22 MED ORDER — LACTATED RINGERS IV SOLN
INTRAVENOUS | Status: DC
  Administered 2015-11-22: 06:00:00 via INTRAVENOUS

## 2015-11-22 MED ORDER — GLYCOPYRROLATE 0.2 MG/ML IJ SOLN
INTRAMUSCULAR | Status: AC
Start: 2015-11-22 — End: 2015-11-22
  Filled 2015-11-22: qty 3

## 2015-11-22 MED ORDER — NEOSTIGMINE METHYLSULFATE 10 MG/10ML IV SOLN
INTRAVENOUS | Status: AC
  Filled 2015-11-22: qty 1

## 2015-11-22 MED ORDER — DEXAMETHASONE SODIUM PHOSPHATE 10 MG/ML IJ SOLN
INTRAMUSCULAR | Status: AC
  Filled 2015-11-22: qty 1

## 2015-11-22 MED ORDER — VANCOMYCIN HCL 10 G IV SOLR
1500.0000 mg | Freq: Once | INTRAVENOUS | Status: AC
  Administered 2015-11-22: 1500 mg via INTRAVENOUS
  Filled 2015-11-22: qty 1500

## 2015-11-22 MED ORDER — PROPOFOL 10 MG/ML IV BOLUS
INTRAVENOUS | Status: DC | PRN
  Administered 2015-11-22: 150 mg via INTRAVENOUS

## 2015-11-22 MED ORDER — INSULIN DETEMIR 100 UNIT/ML ~~LOC~~ SOLN
25.0000 [IU] | Freq: Every evening | SUBCUTANEOUS | Status: DC
  Administered 2015-11-22: 25 [IU] via SUBCUTANEOUS
  Filled 2015-11-22 (×2): qty 0.25

## 2015-11-22 MED ORDER — MIDAZOLAM HCL 2 MG/2ML IJ SOLN
INTRAMUSCULAR | Status: AC
  Filled 2015-11-22: qty 2

## 2015-11-22 MED ORDER — METHOCARBAMOL 500 MG PO TABS
500.0000 mg | ORAL_TABLET | Freq: Four times a day (QID) | ORAL | Status: DC | PRN
  Administered 2015-11-22 – 2015-11-23 (×2): 500 mg via ORAL
  Filled 2015-11-22 (×2): qty 1

## 2015-11-22 MED ORDER — HYDROMORPHONE HCL 1 MG/ML IJ SOLN
0.5000 mg | INTRAMUSCULAR | Status: DC | PRN
  Administered 2015-11-22: 0.5 mg via INTRAVENOUS
  Filled 2015-11-22: qty 1

## 2015-11-22 MED ORDER — THROMBIN 5000 UNITS EX SOLR
CUTANEOUS | Status: AC
  Filled 2015-11-22: qty 5000

## 2015-11-22 MED ORDER — ONDANSETRON HCL 4 MG/2ML IJ SOLN
INTRAMUSCULAR | Status: AC
  Filled 2015-11-22: qty 2

## 2015-11-22 MED ORDER — FENTANYL CITRATE (PF) 100 MCG/2ML IJ SOLN
25.0000 ug | INTRAMUSCULAR | Status: DC | PRN
  Administered 2015-11-22 (×2): 50 ug via INTRAVENOUS

## 2015-11-22 MED ORDER — CEFAZOLIN SODIUM-DEXTROSE 2-3 GM-% IV SOLR
INTRAVENOUS | Status: AC
  Filled 2015-11-22: qty 50

## 2015-11-22 MED ORDER — HYDROCODONE-ACETAMINOPHEN 5-325 MG PO TABS: 1.0000 | ORAL_TABLET | ORAL | Status: DC | PRN

## 2015-11-22 MED ORDER — MAGNESIUM CITRATE PO SOLN
1.0000 | Freq: Once | ORAL | Status: DC | PRN
Start: 2015-11-22 — End: 2015-11-23

## 2015-11-22 MED ORDER — ONDANSETRON HCL 4 MG/2ML IJ SOLN
INTRAMUSCULAR | Status: DC | PRN
  Administered 2015-11-22: 4 mg via INTRAVENOUS

## 2015-11-22 MED ORDER — SUCCINYLCHOLINE CHLORIDE 20 MG/ML IJ SOLN
INTRAMUSCULAR | Status: DC | PRN
  Administered 2015-11-22: 100 mg via INTRAVENOUS

## 2015-11-22 MED ORDER — LIRAGLUTIDE 18 MG/3ML ~~LOC~~ SOPN: 1.7000 mg | PEN_INJECTOR | Freq: Every day | SUBCUTANEOUS | Status: DC

## 2015-11-22 MED ORDER — PROMETHAZINE HCL 25 MG/ML IJ SOLN: 6.2500 mg | INTRAMUSCULAR | Status: DC | PRN

## 2015-11-22 MED ORDER — GLYCOPYRROLATE 0.2 MG/ML IJ SOLN
INTRAMUSCULAR | Status: DC | PRN
  Administered 2015-11-22: 0.6 mg via INTRAVENOUS

## 2015-11-22 MED ORDER — LACTATED RINGERS IV SOLN
INTRAVENOUS | Status: DC
  Administered 2015-11-22: 21:00:00 via INTRAVENOUS

## 2015-11-22 MED ORDER — MENTHOL 3 MG MT LOZG: 1.0000 | LOZENGE | OROMUCOSAL | Status: DC | PRN

## 2015-11-22 MED ORDER — DOCUSATE SODIUM 100 MG PO CAPS: 100.0000 mg | ORAL_CAPSULE | Freq: Two times a day (BID) | ORAL | Status: DC | PRN

## 2015-11-22 MED ORDER — BISACODYL 5 MG PO TBEC: 5.0000 mg | DELAYED_RELEASE_TABLET | Freq: Every day | ORAL | Status: DC | PRN

## 2015-11-22 MED ORDER — BUPIVACAINE-EPINEPHRINE 0.5% -1:200000 IJ SOLN
INTRAMUSCULAR | Status: DC | PRN
  Administered 2015-11-22: 16 mL

## 2015-11-22 MED ORDER — OXYCODONE-ACETAMINOPHEN 5-325 MG PO TABS: 1.0000 | ORAL_TABLET | ORAL | Status: DC | PRN

## 2015-11-22 MED ORDER — PROBIOTIC DAILY PO CAPS: ORAL_CAPSULE | Freq: Every day | ORAL | Status: DC

## 2015-11-22 MED ORDER — LIDOCAINE HCL (CARDIAC) 20 MG/ML IV SOLN
INTRAVENOUS | Status: DC | PRN
  Administered 2015-11-22: 50 mg via INTRAVENOUS

## 2015-11-22 MED ORDER — METHOCARBAMOL 1000 MG/10ML IJ SOLN
500.0000 mg | Freq: Four times a day (QID) | INTRAVENOUS | Status: DC | PRN
  Administered 2015-11-22: 500 mg via INTRAVENOUS
  Filled 2015-11-22 (×2): qty 5

## 2015-11-22 MED ORDER — OXYCODONE-ACETAMINOPHEN 5-325 MG PO TABS
1.0000 | ORAL_TABLET | ORAL | Status: DC | PRN
  Administered 2015-11-22 – 2015-11-23 (×5): 1 via ORAL
  Filled 2015-11-22 (×5): qty 1

## 2015-11-22 MED ORDER — PHENOL 1.4 % MT LIQD: 1.0000 | OROMUCOSAL | Status: DC | PRN

## 2015-11-22 MED ORDER — LIDOCAINE HCL (CARDIAC) 20 MG/ML IV SOLN
INTRAVENOUS | Status: AC
  Filled 2015-11-22: qty 5

## 2015-11-22 MED ORDER — ACETAMINOPHEN 325 MG PO TABS: 650.0000 mg | ORAL_TABLET | ORAL | Status: DC | PRN

## 2015-11-22 MED ORDER — INSULIN ASPART 100 UNIT/ML ~~LOC~~ SOLN
0.0000 [IU] | Freq: Three times a day (TID) | SUBCUTANEOUS | Status: DC
  Administered 2015-11-22: 15 [IU] via SUBCUTANEOUS
  Administered 2015-11-23: 8 [IU] via SUBCUTANEOUS
  Administered 2015-11-23: 11 [IU] via SUBCUTANEOUS

## 2015-11-22 MED ORDER — ROCURONIUM BROMIDE 100 MG/10ML IV SOLN
INTRAVENOUS | Status: AC
  Filled 2015-11-22: qty 1

## 2015-11-22 MED ORDER — BUPIVACAINE-EPINEPHRINE (PF) 0.5% -1:200000 IJ SOLN
INTRAMUSCULAR | Status: AC
  Filled 2015-11-22: qty 30

## 2015-11-22 MED ORDER — THROMBIN 5000 UNITS EX SOLR
CUTANEOUS | Status: DC | PRN
  Administered 2015-11-22: 5000 [IU] via TOPICAL

## 2015-11-22 MED ORDER — KCL IN DEXTROSE-NACL 20-5-0.45 MEQ/L-%-% IV SOLN
INTRAVENOUS | Status: DC
  Administered 2015-11-22: 11:00:00 via INTRAVENOUS
  Filled 2015-11-22 (×2): qty 1000

## 2015-11-22 MED ORDER — POLYMYXIN B SULFATE 500000 UNITS IJ SOLR
INTRAMUSCULAR | Status: DC | PRN
  Administered 2015-11-22: 500 mL

## 2015-11-22 MED ORDER — NEOSTIGMINE METHYLSULFATE 10 MG/10ML IV SOLN
INTRAVENOUS | Status: DC | PRN
  Administered 2015-11-22: 4 mg via INTRAVENOUS

## 2015-11-22 MED ORDER — DOCUSATE SODIUM 100 MG PO CAPS
100.0000 mg | ORAL_CAPSULE | Freq: Two times a day (BID) | ORAL | Status: DC
  Administered 2015-11-22 – 2015-11-23 (×3): 100 mg via ORAL

## 2015-11-22 MED ORDER — MIDAZOLAM HCL 5 MG/5ML IJ SOLN
INTRAMUSCULAR | Status: DC | PRN
  Administered 2015-11-22: 2 mg via INTRAVENOUS

## 2015-11-22 MED ORDER — ALUM & MAG HYDROXIDE-SIMETH 200-200-20 MG/5ML PO SUSP: 30.0000 mL | Freq: Four times a day (QID) | ORAL | Status: DC | PRN

## 2015-11-22 MED ORDER — ACETAMINOPHEN 650 MG RE SUPP: 650.0000 mg | RECTAL | Status: DC | PRN

## 2015-11-22 MED ORDER — FENTANYL CITRATE (PF) 250 MCG/5ML IJ SOLN
INTRAMUSCULAR | Status: AC
Start: 2015-11-22 — End: 2015-11-22
  Filled 2015-11-22: qty 5

## 2015-11-22 MED ORDER — FENTANYL CITRATE (PF) 250 MCG/5ML IJ SOLN
INTRAMUSCULAR | Status: DC | PRN
  Administered 2015-11-22 (×3): 50 ug via INTRAVENOUS
  Administered 2015-11-22: 100 ug via INTRAVENOUS

## 2015-11-22 MED ORDER — VANCOMYCIN HCL 10 G IV SOLR
1500.0000 mg | INTRAVENOUS | Status: AC
  Administered 2015-11-22: 1500 mg via INTRAVENOUS
  Filled 2015-11-22 (×2): qty 1500

## 2015-11-22 MED ORDER — ROCURONIUM BROMIDE 100 MG/10ML IV SOLN
INTRAVENOUS | Status: DC | PRN
  Administered 2015-11-22: 30 mg via INTRAVENOUS
  Administered 2015-11-22: 10 mg via INTRAVENOUS

## 2015-11-22 MED ORDER — SENNOSIDES-DOCUSATE SODIUM 8.6-50 MG PO TABS: 1.0000 | ORAL_TABLET | Freq: Every evening | ORAL | Status: DC | PRN

## 2015-11-22 MED ORDER — PROPOFOL 10 MG/ML IV BOLUS
INTRAVENOUS | Status: AC
  Filled 2015-11-22: qty 20

## 2015-11-22 MED ORDER — ONDANSETRON HCL 4 MG/2ML IJ SOLN: 4.0000 mg | INTRAMUSCULAR | Status: DC | PRN

## 2015-11-22 MED ORDER — CEFAZOLIN SODIUM-DEXTROSE 2-3 GM-% IV SOLR
2.0000 g | INTRAVENOUS | Status: AC
  Administered 2015-11-22: 2 g via INTRAVENOUS

## 2015-11-22 MED ORDER — SACCHAROMYCES BOULARDII 250 MG PO CAPS
250.0000 mg | ORAL_CAPSULE | Freq: Two times a day (BID) | ORAL | Status: DC
  Administered 2015-11-22 – 2015-11-23 (×2): 250 mg via ORAL
  Filled 2015-11-22 (×3): qty 1

## 2015-11-22 MED ORDER — SODIUM CHLORIDE 0.9 % IR SOLN
Status: AC
  Filled 2015-11-22: qty 1

## 2015-11-22 MED ORDER — FENTANYL CITRATE (PF) 100 MCG/2ML IJ SOLN
INTRAMUSCULAR | Status: AC
  Administered 2015-11-22: 50 ug via INTRAVENOUS
  Filled 2015-11-22: qty 2

## 2015-11-22 MED ORDER — METHOCARBAMOL 500 MG PO TABS: 500.0000 mg | ORAL_TABLET | Freq: Three times a day (TID) | ORAL | Status: DC | PRN

## 2015-11-22 SURGICAL SUPPLY — 43 items
BAG ZIPLOCK 12X15 (MISCELLANEOUS) IMPLANT
CLOSURE WOUND 1/2 X4 (GAUZE/BANDAGES/DRESSINGS) ×1
CLOTH 2% CHLOROHEXIDINE 3PK (PERSONAL CARE ITEMS) ×3 IMPLANT
DRAPE MICROSCOPE LEICA (MISCELLANEOUS) ×3 IMPLANT
DRAPE SHEET LG 3/4 BI-LAMINATE (DRAPES) IMPLANT
DRAPE SURG 17X11 SM STRL (DRAPES) ×3 IMPLANT
DRAPE UTILITY XL STRL (DRAPES) ×3 IMPLANT
DRSG AQUACEL AG ADV 3.5X 4 (GAUZE/BANDAGES/DRESSINGS) ×3 IMPLANT
DRSG AQUACEL AG ADV 3.5X 6 (GAUZE/BANDAGES/DRESSINGS) IMPLANT
DURAPREP 26ML APPLICATOR (WOUND CARE) ×3 IMPLANT
DURASEAL SPINE SEALANT 3ML (MISCELLANEOUS) IMPLANT
ELECT BLADE TIP CTD 4 INCH (ELECTRODE) ×3 IMPLANT
ELECT REM PT RETURN 9FT ADLT (ELECTROSURGICAL) ×3
ELECTRODE REM PT RTRN 9FT ADLT (ELECTROSURGICAL) ×1 IMPLANT
GLOVE BIOGEL PI IND STRL 7.0 (GLOVE) ×1 IMPLANT
GLOVE BIOGEL PI INDICATOR 7.0 (GLOVE) ×2
GLOVE SURG SS PI 7.0 STRL IVOR (GLOVE) ×3 IMPLANT
GLOVE SURG SS PI 7.5 STRL IVOR (GLOVE) ×3 IMPLANT
GLOVE SURG SS PI 8.0 STRL IVOR (GLOVE) ×6 IMPLANT
GOWN STRL REUS W/TWL XL LVL3 (GOWN DISPOSABLE) ×6 IMPLANT
IV CATH 14GX2 1/4 (CATHETERS) IMPLANT
KIT BASIN OR (CUSTOM PROCEDURE TRAY) ×3 IMPLANT
KIT POSITIONING SURG ANDREWS (MISCELLANEOUS) ×3 IMPLANT
MANIFOLD NEPTUNE II (INSTRUMENTS) ×3 IMPLANT
NEEDLE SPNL 18GX3.5 QUINCKE PK (NEEDLE) ×6 IMPLANT
PACK LAMINECTOMY ORTHO (CUSTOM PROCEDURE TRAY) ×3 IMPLANT
PATTIES SURGICAL .5 X.5 (GAUZE/BANDAGES/DRESSINGS) IMPLANT
PATTIES SURGICAL .75X.75 (GAUZE/BANDAGES/DRESSINGS) ×3 IMPLANT
RUBBERBAND STERILE (MISCELLANEOUS) ×6 IMPLANT
SPONGE SURGIFOAM ABS GEL 100 (HEMOSTASIS) ×3 IMPLANT
STAPLER VISISTAT (STAPLE) IMPLANT
STRIP CLOSURE SKIN 1/2X4 (GAUZE/BANDAGES/DRESSINGS) ×2 IMPLANT
SUT NURALON 4 0 TR CR/8 (SUTURE) IMPLANT
SUT PROLENE 3 0 PS 2 (SUTURE) ×3 IMPLANT
SUT VIC AB 1 CT1 27 (SUTURE)
SUT VIC AB 1 CT1 27XBRD ANTBC (SUTURE) IMPLANT
SUT VIC AB 1-0 CT2 27 (SUTURE) ×6 IMPLANT
SUT VIC AB 2-0 CT1 27 (SUTURE)
SUT VIC AB 2-0 CT1 TAPERPNT 27 (SUTURE) IMPLANT
SUT VIC AB 2-0 CT2 27 (SUTURE) IMPLANT
SYR 3ML LL SCALE MARK (SYRINGE) IMPLANT
TOWEL OR 17X26 10 PK STRL BLUE (TOWEL DISPOSABLE) ×3 IMPLANT
YANKAUER SUCT BULB TIP NO VENT (SUCTIONS) ×3 IMPLANT

## 2015-11-22 NOTE — Interval H&P Note (Signed)
History and Physical Interval Note:  11/22/2015 7:24 AM  Erica Nguyen  has presented today for surgery, with the diagnosis of HNP L5-S1 ON RIGHT   The various methods of treatment have been discussed with the patient and family. After consideration of risks, benefits and other options for treatment, the patient has consented to  Procedure(s): DECOMPRESSIVE LUMBAR LAMINECTOMY L5-S1 ON RIGHT  (Right) as a surgical intervention .  The patient's history has been reviewed, patient examined, no change in status, stable for surgery.  I have reviewed the patient's chart and labs.  Questions were answered to the patient's satisfaction.     Jalaina Salyers C

## 2015-11-22 NOTE — Progress Notes (Signed)
ANTIBIOTIC CONSULT NOTE - INITIAL  Pharmacy Consult for Vancomycin Indication: x 1 dose 12 hours post-op for surgical prophylaxis  Allergies  Allergen Reactions  . Amoxicillin Other (See Comments)    Yeast Infection with AMOXIL Use Give pt yeast infection    Patient Measurements: Height: 5\' 6"  (167.6 cm) Weight: 238 lb 8 oz (108.183 kg) IBW/kg (Calculated) : 59.3  Vital Signs: Temp: 97.7 F (36.5 C) (11/30 1136) Temp Source: Oral (11/30 1136) BP: 143/74 mmHg (11/30 1136) Pulse Rate: 84 (11/30 1136) Intake/Output from previous day:   Intake/Output from this shift: Total I/O In: 1050 [I.V.:1050] Out: -   Labs: No results for input(s): WBC, HGB, PLT, LABCREA, CREATININE in the last 72 hours. Estimated Creatinine Clearance: 114.1 mL/min (by C-G formula based on Cr of 0.6). No results for input(s): VANCOTROUGH, VANCOPEAK, VANCORANDOM, GENTTROUGH, GENTPEAK, GENTRANDOM, TOBRATROUGH, TOBRAPEAK, TOBRARND, AMIKACINPEAK, AMIKACINTROU, AMIKACIN in the last 72 hours.   Microbiology: Recent Results (from the past 720 hour(s))  Surgical pcr screen     Status: Abnormal   Collection Time: 11/15/15  9:05 AM  Result Value Ref Range Status   MRSA, PCR POSITIVE (A) NEGATIVE Final    Comment: RESULT CALLED TO, READ BACK BY AND VERIFIED WITH: DARLENE REED,RN [id number] @ 1322 BY J SCOTTON    Staphylococcus aureus POSITIVE (A) NEGATIVE Final    Comment:        The Xpert SA Assay (FDA approved for NASAL specimens in patients over 66 years of age), is one component of a comprehensive surveillance program.  Test performance has been validated by [facility] for patients greater than or equal to 86 year old. It is not intended to diagnose infection nor to guide or monitor treatment. RESULT CALLED TO, READ BACK BY AND VERIFIED WITH: DARLENE REED,RN [id number] @ [place]     Medical History: Past Medical History  Diagnosis Date  . Hypertension     chronic hypertension  .  Diabetes mellitus     insulin-dependent  . Fibroid   . Abnormal Pap smear   . Family history of adverse reaction to anesthesia     sister has n/v  . HNP (herniated nucleus pulposus)     Assessment: 65 y/oF with PMH of DM, HTN who underwent decompressive lumbar laminectomy L5-S1 on right for herniated nucleus pulposa. Patient has positive MRSA screen. She received a dose of cefazolin 2 grams and Vancomycin 1500 mg pre-procedure. Patient does not have a drain in place per nursing. Pharmacy consulted to assist with dosing of Vancomycin x 1 dose 12 hours post-op. SCr is WNL with CrCl > 100 ml/min.   Goal of Therapy:  Prevention of infection  Plan:   Give Vancomycin 1500mg  IV x 1 at 1830 today.  Will sign off at this time.  Thank you for the consult.   Lindell Spar, PharmD, BCPS Pager: [phone] 11/22/2015 11:53 AM

## 2015-11-22 NOTE — Discharge Instructions (Signed)
Walk As Tolerated utilizing back precautions.  No bending, twisting, or lifting.  No driving for 2 weeks.   °Aquacel dressing may remain in place until follow up. May shower with aquacel dressing in place. If the dressing peels off or becomes saturated, you may remove aquacel dressing and place gauze and tape dressing which should be kept clean and dry and changed daily. Do not remove steri-strips if they are present. °See Dr. Jerrye Seebeck in office in 10 to 14 days. Begin taking aspirin 81mg per day starting 4 days after your surgery if not allergic to aspirin or on another blood thinner. °Walk daily even outside. Use a cane or walker only if necessary. °Avoid sitting on soft sofas. ° °

## 2015-11-22 NOTE — Anesthesia Procedure Notes (Signed)
Procedure Name: Intubation Date/Time: 11/22/2015 7:33 AM Performed by: Dione Booze Pre-anesthesia Checklist: Patient identified, Emergency Drugs available, Suction available and Patient being monitored Patient Re-evaluated:Patient Re-evaluated prior to inductionOxygen Delivery Method: Circle system utilized Preoxygenation: Pre-oxygenation with 100% oxygen Intubation Type: IV induction Laryngoscope Size: 2 and 4 Grade View: Grade I Tube type: Oral Tube size: 7.5 mm Airway Equipment and Method: Stylet Placement Confirmation: ETT inserted through vocal cords under direct vision,  breath sounds checked- equal and bilateral and positive ETCO2 Secured at: 21 cm Tube secured with: Tape Dental Injury: Teeth and Oropharynx as per pre-operative assessment

## 2015-11-22 NOTE — Evaluation (Signed)
Physical Therapy Evaluation Patient Details Name: Erica Nguyen MRN: [id number] DOB: 07-19-1971 Today's Date: 11/22/2015   History of Present Illness  DECOMPRESSIVE LUMBAR LAMINECTOMY L5-S1 ON RIGHT,FORAMINOTOMIES L5,S1 DISCECTOMY L5,S1 (Right  Clinical Impression  Patient is mobilizing  Well. will practice steps tomorrow. Patient will benefit from PT to address problems listed in note below.    Follow Up Recommendations No PT follow up    Equipment Recommendations  None recommended by PT    Recommendations for Other Services       Precautions / Restrictions Precautions Precautions: Back Precaution Comments: reviewed BAT      Mobility  Bed Mobility               General bed mobility comments: in recliner  Transfers Overall transfer level: Needs assistance Equipment used: Rolling walker (2 wheeled) Transfers: Sit to/from Stand Sit to Stand: Supervision         General transfer comment: cues for  technique  Ambulation/Gait Ambulation/Gait assistance: Supervision Ambulation Distance (Feet): 0 Feet Assistive device: Rolling walker (2 wheeled) Gait Pattern/deviations: WFL(Within Functional Limits)     General Gait Details: ambulated x 100' with RW and 100' without, gait steady  Stairs            Wheelchair Mobility    Modified Rankin (Stroke Patients Only)       Balance                                             Pertinent Vitals/Pain Pain Assessment: 0-10 Pain Score: 4  Pain Location: back Pain Descriptors / Indicators: Discomfort Pain Intervention(s): Premedicated before session    Home Living Family/patient expects to be discharged to:: Private residence Living Arrangements: Other relatives Available Help at Discharge: Family Type of Home: House Home Access: Stairs to enter Entrance Stairs-Rails: None Technical brewer of Steps: 3 Home Layout: Two level;1/2 bath on main level;Bed/bath upstairs Home  Equipment: None      Prior Function Level of Independence: Independent               Hand Dominance        Extremity/Trunk Assessment   Upper Extremity Assessment: Overall WFL for tasks assessed           Lower Extremity Assessment: Overall WFL for tasks assessed      Cervical / Trunk Assessment: Normal  Communication   Communication: No difficulties  Cognition Arousal/Alertness: Awake/alert Behavior During Therapy: WFL for tasks assessed/performed Overall Cognitive Status: Within Functional Limits for tasks assessed                      General Comments      Exercises        Assessment/Plan    PT Assessment Patient needs continued PT services  PT Diagnosis Difficulty walking;Acute pain   PT Problem List Decreased activity tolerance;Decreased mobility;Decreased safety awareness;Decreased knowledge of precautions  PT Treatment Interventions DME instruction;Gait training;Stair training;Functional mobility training;Patient/family education   PT Goals (Current goals can be found in the Care Plan section) Acute Rehab PT Goals Patient Stated Goal: to go home, no pain PT Goal Formulation: With patient Time For Goal Achievement: 11/24/15 Potential to Achieve Goals: Good    Frequency Min 5X/week   Barriers to discharge        Co-evaluation  End of Session   Activity Tolerance: Patient tolerated treatment well Patient left: in chair;with call bell/phone within reach;with family/visitor present Nurse Communication: Mobility status    Functional Assessment Tool Used: clinical judgement Functional Limitation: Mobility: Walking and moving around Mobility: Walking and Moving Around Current Status [id number]): At least 1 percent but less than 20 percent impaired, limited or restricted Mobility: Walking and Moving Around Goal Status [phone]): 0 percent impaired, limited or restricted    Time: [id number] PT Time Calculation (min)  (ACUTE ONLY): 15 min   Charges:   PT Evaluation $Initial PT Evaluation Tier I: 1 Procedure     PT G Codes:   PT G-Codes **NOT FOR INPATIENT CLASS** Functional Assessment Tool Used: clinical judgement Functional Limitation: Mobility: Walking and moving around Mobility: Walking and Moving Around Current Status [id number]): At least 1 percent but less than 20 percent impaired, limited or restricted Mobility: Walking and Moving Around Goal Status [phone]): 0 percent impaired, limited or restricted    Claretha Cooper 11/22/2015, 5:09 PM

## 2015-11-22 NOTE — H&P (View-Only) (Signed)
Erica Nguyen is an 44 y.o. female.   Chief Complaint: back and R leg pain HPI: The patient is a 44 year old female who presents with back pain. The patient is here today in referral from Dr. Nelva Bush. The patient reports low back symptoms including pain which began 3 month(s) ago without any known injury. and Symptoms include tingling (right foot). The pain radiates to the right buttock, right posterior thigh and right posterior lower leg. The patient describes the pain as aching and throbbing. The patient describes the severity of their symptoms as 6 / 10. Symptoms are exacerbated by standing, sitting and recumbency. Symptoms are relieved by opioid analgesics (some relief). Current treatment includes nonsteroidal anti-inflammatory drugs (Ibuprofen), opioid analgesics (Norco) and heating pad. Prior to being seen today the patient was previously evaluated by Dr. Nelva Bush. Past evaluation has included x-ray of the lumbar spine and MRI of the lumbar spine. Past treatment has included nonsteroidal anti-inflammatory drugs, opioid analgesics (Norco), corticosteroids, physical therapy, heating pad and right S1 SNRB.  Past Medical History  Diagnosis Date  . Hypertension     chronic hypertension  . Diabetes mellitus     insulin-dependent  . Abnormal Pap smear of vagina   . History of anemia   . Polyhydramnios   . History of varicella     Childhood Varicell  . Fibroid   . Abnormal Pap smear     Past Surgical History  Procedure Laterality Date  . Cardiac catheterization  02/29/2008    Normal coronary arteries -- Normal LV (left ventricular) systolic function -- Mild to moderate elevation in left ventricular end-diastolic  pressure secondary to hypertension, diabetes and obesity  . Novasure ablation  01/08/2008  . Cholecystectomy  1995  . Hysteroscopy, d&c, novasure ablation, removal of intrauterine device  01/08/2008  . Gynecologic cryosurgery  1998    Family History  Problem Relation Age of Onset  .  Heart disease Mother   . Diabetes Mother   . Hypertension Father   . Diabetes Father   . Kidney failure Father   . Stroke Father   . Hypertension Paternal Grandmother   . Diabetes Paternal Grandmother   . Stroke Paternal Grandmother   . Diabetes Maternal Grandmother   . Ovarian cancer Maternal Grandmother   . Diabetes Maternal Grandfather   . Diabetes Paternal Grandfather    Social History:  reports that she has never smoked. She has never used smokeless tobacco. She reports that she does not drink alcohol or use illicit drugs.  Allergies:  Allergies  Allergen Reactions  . Amoxicillin Other (See Comments)    Yeast Infection with AMOXIL Use Give pt yeast infection     (Not in a hospital admission)  No results found for this or any previous visit (from the past 48 hour(s)). No results found.  Review of Systems  Constitutional: Negative.   HENT: Negative.   Eyes: Negative.   Respiratory: Negative.   Cardiovascular: Negative.   Gastrointestinal: Negative.   Genitourinary: Negative.   Musculoskeletal: Positive for back pain.  Skin: Negative.   Neurological: Positive for sensory change and focal weakness.    There were no vitals taken for this visit. Physical Exam  Constitutional: She is oriented to person, place, and time. She appears well-developed.  HENT:  Head: Normocephalic.  Eyes: Pupils are equal, round, and reactive to light.  Neck: Normal range of motion.  Respiratory: Effort normal.  GI: Soft.  Musculoskeletal:  On exam, moderate distress. Mood and affect is appropriate  with a slight antalgic gait. Straight leg raise on the right produces buttock, thigh, and calf pain; on the left, negative. EHLs 4+/5 on the right. On the left, decreased sensation in the L5-S1 dermatome. Pain with extension.  Lumbar spine exam reveals no evidence of soft tissue swelling, deformity or skin ecchymosis. On palpation there is no tenderness of the lumbar spine. No flank pain with  percussion. The abdomen is soft and nontender. Nontender over the trochanters. No cellulitis or lymphadenopathy.  Good range of motion of the lumbar spine without associated pain. Motor is 5/5 including tibialis anterior, plantar flexion, quadriceps and hamstrings. Patient is normoreflexic. There is no Babinski or clonus. Sensory exam is intact to light touch. Patient has good distal pulses. No DVT. No pain and normal range of motion without instability of the hips, knees and ankles.  Neurological: She is alert and oriented to person, place, and time.  Skin: Skin is warm and dry.    MRI demonstrates large disc herniation paracentral to the right displacing the S1 nerve root.  Three-view x-rays, AP and lateral flexion and extension demonstrates disc space narrowing at L5-S1. No instability on flexion and extension.  Assessment/Plan 1. S1 radiculopathy and myotomal weakness, dermatomal dysesthesias secondary to disc herniation at L5-S1 despite rest, activity, modification, on PT and epidural. 2. Non-insulin-dependent diabetes.  We had an extensive discussion with Ms. Milazzo concerning current pathology, relevant anatomy, and treatment options at this point in time. Given the presence of neural tension signs and neurologic deficit and persistent pain despite conservative treatment, it is reasonable to proceed with a lumbar decompression at this point. I had an extensive discussion of the risks and benefits of the lumbar decompression with the patient including bleeding, infection, damage to neurovascular structures, epidural fibrosis, CSF leak requiring repair. We also discussed increase in pain, adjacent segment disease, recurrent disc herniation, need for future surgery including repeat decompression and/or fusion. We also discussed risks of postoperative hematoma, paralysis, anesthetic complications including DVT, PE, death, cardiopulmonary dysfunction. In addition, the perioperative and postoperative  courses were discussed in detail including the rehabilitative time and return to functional activity and work. I provided the patient with an illustrated handout and utilized the appropriate surgical models.  Overnight in the hospital, sutures out in two weeks. We discussed maximum medical improvement perhaps six weeks postop. 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 She reported increased in her blood sugars following the injection. We will avoid steroids at this point. Continue with her Norco. Activity modification, strategies to avoid re-injury. I appreciate the kind referral by Dr. Nelva Bush.  Plan microlumbar decompression L5-S1 right  BISSELL, JACLYN M. PA-C for Dr. Tonita Cong 11/14/2015, 4:50 PM

## 2015-11-22 NOTE — Anesthesia Postprocedure Evaluation (Signed)
Anesthesia Post Note  Patient: Erica Nguyen  Procedure(s) Performed: Procedure(s) (LRB): DECOMPRESSIVE LUMBAR LAMINECTOMY L5-S1 ON RIGHT,FORAMINOTOMIES L5,S1 DISCECTOMY L5,S1 (Right)  Patient location during evaluation: PACU Anesthesia Type: General Level of consciousness: awake and alert Pain management: pain level controlled Vital Signs Assessment: post-procedure vital signs reviewed and stable Respiratory status: spontaneous breathing, nonlabored ventilation, respiratory function stable and patient connected to nasal cannula oxygen Cardiovascular status: blood pressure returned to baseline and stable Postop Assessment: no signs of nausea or vomiting Anesthetic complications: no    Last Vitals:  Filed Vitals:   11/22/15 1136 11/22/15 1238  BP: 143/74 135/79  Pulse: 84 102  Temp: 36.5 C 36.6 C  Resp: 16 16    Last Pain:  Filed Vitals:   11/22/15 1239  PainSc: 0-No pain    LLE Motor Response: Purposeful movement LLE Sensation: Full sensation, No numbness, No tingling RLE Motor Response: Purposeful movement RLE Sensation: Full sensation, No numbness, No tingling      Catalina Gravel

## 2015-11-22 NOTE — Transfer of Care (Signed)
Immediate Anesthesia Transfer of Care Note  Patient: Erica Nguyen  Procedure(s) Performed: Procedure(s): DECOMPRESSIVE LUMBAR LAMINECTOMY L5-S1 ON RIGHT,FORAMINOTOMIES L5,S1 DISCECTOMY L5,S1 (Right)  Patient Location: PACU  Anesthesia Type:General  Level of Consciousness: awake, alert  and patient cooperative  Airway & Oxygen Therapy: Patient Spontanous Breathing and Patient connected to face mask oxygen  Post-op Assessment: Report given to RN and Post -op Vital signs reviewed and stable  Post vital signs: Reviewed and stable  Last Vitals:  Filed Vitals:   11/22/15 0544  BP: 157/91  Pulse: 103  Temp: 36.8 C  Resp: 18    Complications: No apparent anesthesia complications

## 2015-11-22 NOTE — Anesthesia Preprocedure Evaluation (Addendum)
Anesthesia Evaluation  Patient identified by MRN, date of birth, ID band Patient awake    Reviewed: Allergy & Precautions, NPO status , Patient's Chart, lab work & pertinent test results  Airway Mallampati: II  TM Distance: >3 FB Neck ROM: Full    Dental  (+) Dental Advisory Given, Partial Upper   Pulmonary neg pulmonary ROS,    Pulmonary exam normal breath sounds clear to auscultation       Cardiovascular hypertension, Pt. on medications Normal cardiovascular exam Rhythm:Regular Rate:Normal  History of ?Angina:  LHC 04/2011: IMPRESSIONS: 1. Normal coronary arteries. 2. Normal LV (left ventricular) systolic function. 3. Mild to moderate elevation in left ventricular end-diastolic pressure secondary to hypertension, diabetes and obesity.   Neuro/Psych negative neurological ROS  negative psych ROS   GI/Hepatic negative GI ROS, Neg liver ROS,   Endo/Other  diabetes, Poorly Controlled, Insulin DependentObesity   Renal/GU negative Renal ROS     Musculoskeletal negative musculoskeletal ROS (+)   Abdominal   Peds  Hematology negative hematology ROS (+)   Anesthesia Other Findings Day of surgery medications reviewed with the patient.  Reproductive/Obstetrics                         Anesthesia Physical Anesthesia Plan  ASA: III  Anesthesia Plan: General   Post-op Pain Management:    Induction: Intravenous  Airway Management Planned: Oral ETT  Additional Equipment:   Intra-op Plan:   Post-operative Plan: Extubation in OR  Informed Consent: I have reviewed the patients History and Physical, chart, labs and discussed the procedure including the risks, benefits and alternatives for the proposed anesthesia with the patient or authorized representative who has indicated his/her understanding and acceptance.   Dental advisory given  Plan Discussed with: CRNA  Anesthesia Plan Comments:  (Risks/benefits of general anesthesia discussed with patient including risk of damage to teeth, lips, gum, and tongue, nausea/vomiting, allergic reactions to medications, and the possibility of heart attack, stroke and death.  All patient questions answered.  Patient wishes to proceed.)        Anesthesia Quick Evaluation

## 2015-11-22 NOTE — Brief Op Note (Signed)
11/22/2015  9:00 AM  PATIENT:  Erica Nguyen  44 y.o. female  PRE-OPERATIVE DIAGNOSIS:  HERNIATED NUCLEUS PULPOSA L5-S1 ON RIGHT   POST-OPERATIVE DIAGNOSIS:  HERNIATED NUCLEUS PULPOSA L5-S1 ON RIGHT   PROCEDURE:  Procedure(s): DECOMPRESSIVE LUMBAR LAMINECTOMY L5-S1 ON RIGHT,FORAMINOTOMIES L5,S1 DISCECTOMY L5,S1 (Right)  SURGEON:  Surgeon(s) and Role:    * Susa Day, MD - Primary  PHYSICIAN ASSISTANT:   ASSISTANTS: Bissell   ANESTHESIA:   general  EBL:     BLOOD ADMINISTERED:none  DRAINS: none   LOCAL MEDICATIONS USED:  MARCAINE     SPECIMEN:  Source of Specimen:  L5S1  DISPOSITION OF SPECIMEN:  PATHOLOGY  COUNTS:  YES  TOURNIQUET:  * No tourniquets in log *  DICTATION: .Other Dictation: Dictation Number [id number]  PLAN OF CARE: Admit for overnight observation  PATIENT DISPOSITION:  PACU - hemodynamically stable.   Delay start of Pharmacological VTE agent (>24hrs) due to surgical blood loss or risk of bleeding: yes

## 2015-11-23 DIAGNOSIS — M5126 Other intervertebral disc displacement, lumbar region: Secondary | ICD-10-CM | POA: Diagnosis not present

## 2015-11-23 LAB — BASIC METABOLIC PANEL
Anion gap: 8 (ref 5–15)
BUN: 13 mg/dL (ref 6–20)
CALCIUM: 8.2 mg/dL — AB (ref 8.9–10.3)
CO2: 24 mmol/L (ref 22–32)
Chloride: 102 mmol/L (ref 101–111)
Creatinine, Ser: 0.53 mg/dL (ref 0.44–1.00)
GFR calc Af Amer: >60 mL/min (ref >60)
GLUCOSE: 370 mg/dL — AB (ref 65–99)
POTASSIUM: 4.1 mmol/L (ref 3.5–5.1)
SODIUM: 134 mmol/L — AB (ref 135–145)

## 2015-11-23 LAB — GLUCOSE, CAPILLARY
GLUCOSE-CAPILLARY: 312 mg/dL — AB (ref 65–99)
GLUCOSE-CAPILLARY: 261 mg/dL — AB (ref 65–99)
Glucose-Capillary: 294 mg/dL — ABNORMAL HIGH (ref 65–99)

## 2015-11-23 NOTE — Progress Notes (Signed)
Physical Therapy Treatment Patient Details Name: Erica Nguyen MRN: [id number] DOB: 11-11-1971 Today's Date: 11/23/2015    History of Present Illness DECOMPRESSIVE LUMBAR LAMINECTOMY L5-S1 ON RIGHT,FORAMINOTOMIES L5,S1 DISCECTOMY L5,S1 (Right    PT Comments    Patient  Has progressed well.  Ready for DC.  Follow Up Recommendations  No PT follow up     Equipment Recommendations  None recommended by PT    Recommendations for Other Services       Precautions / Restrictions Precautions Precautions: Back Precaution Booklet Issued: Yes (comment) Precaution Comments: reviewed BAT    Mobility  Bed Mobility               General bed mobility comments: in recliner  Transfers   Equipment used: None   Sit to Stand: Supervision            Ambulation/Gait Ambulation/Gait assistance: Supervision Ambulation Distance (Feet): 300 Feet Assistive device: None Gait Pattern/deviations: WFL(Within Functional Limits) Gait velocity: slow, guardedly       Stairs            Wheelchair Mobility    Modified Rankin (Stroke Patients Only)       Balance Overall balance assessment: Independent                                  Cognition                            Exercises      General Comments        Pertinent Vitals/Pain Pain Score: 2  Faces Pain Scale: Hurts a little bit Pain Location: back Pain Descriptors / Indicators: Discomfort;Sore Pain Intervention(s): Premedicated before session    Home Living                      Prior Function            PT Goals (current goals can now be found in the care plan section) Progress towards PT goals: Progressing toward goals    Frequency       PT Plan Current plan remains appropriate    Co-evaluation             End of Session   Activity Tolerance: Patient tolerated treatment well Patient left:  (up in room)     Time: [id number] PT Time Calculation  (min) (ACUTE ONLY): 14 min  Charges:  $Gait Training: 8-22 mins                    G Codes:  Mobility: Walking and Moving Around Goal Status [phone]): 0 percent impaired, limited or restricted Mobility: Walking and Moving Around Discharge Status [phone]): 0 percent impaired, limited or restricted   Claretha Cooper 11/23/2015, 1:30 PM

## 2015-11-23 NOTE — Progress Notes (Signed)
Occupational Therapy Evaluation Patient Details Name: Erica Nguyen MRN: [id number] DOB: 06/08/1971 Today's Date: 11/23/2015    History of Present Illness DECOMPRESSIVE LUMBAR LAMINECTOMY L5-S1 ON RIGHT,FORAMINOTOMIES L5,S1 DISCECTOMY L5,S1 (Right   Clinical Impression   All OT education completed and patient does not require further OT. She is currently functioning at min A level overall for ADLs. Will have family assistance at home.     Follow Up Recommendations  No OT follow up;Supervision - Intermittent    Equipment Recommendations  None recommended by OT    Recommendations for Other Services PT consult     Precautions / Restrictions Precautions Precautions: Back Precaution Booklet Issued: Yes (comment) Precaution Comments: reviewed BAT      Mobility Bed Mobility               General bed mobility comments: in recliner  Transfers Overall transfer level: Needs assistance Equipment used: None Transfers: Sit to/from Stand Sit to Stand: Supervision              Balance                                            ADL Overall ADL's : Needs assistance/impaired Eating/Feeding: Independent   Grooming: Independent   Upper Body Bathing: Set up;Sitting   Lower Body Bathing: Sit to/from stand;Minimal assistance   Upper Body Dressing : Set up;Sitting   Lower Body Dressing: Minimal assistance;Sit to/from stand   Toilet Transfer: Supervision/safety;Ambulation   Toileting- Clothing Manipulation and Hygiene: Supervision/safety;Sit to/from stand       Functional mobility during ADLs: Supervision/safety General ADL Comments: Patient received up in recliner. Agreeable to OT. Provided with back precautions handout and went over techniques for BAT during ADLs. Patient verbalized understanding. Patient will have sister and son to assist as needed with LB dressing. She reports she has been going to bathroom independently; no difficulty with  toilet hygiene. Discussed tub transfer technique (side step method) and demonstrated to patient. She verbalized understanding. All education completed at this time and patient questions answered. Discussed 3 in 1 -- patient has sink on one side that she can use to push up on. Does not feel she needs BSC.     Vision     Perception     Praxis      Pertinent Vitals/Pain Pain Assessment: Faces Faces Pain Scale: Hurts a little bit Pain Location: back Pain Descriptors / Indicators: Discomfort;Sore Pain Intervention(s): Limited activity within patient's tolerance;Monitored during session     Hand Dominance Right   Extremity/Trunk Assessment Upper Extremity Assessment Upper Extremity Assessment: Overall WFL for tasks assessed   Lower Extremity Assessment Lower Extremity Assessment: Defer to PT evaluation   Cervical / Trunk Assessment Cervical / Trunk Assessment: Normal   Communication Communication Communication: No difficulties   Cognition Arousal/Alertness: Awake/alert Behavior During Therapy: WFL for tasks assessed/performed Overall Cognitive Status: Within Functional Limits for tasks assessed                     General Comments       Exercises       Shoulder Instructions      Home Living Family/patient expects to be discharged to:: Private residence Living Arrangements: Other relatives Available Help at Discharge: Family Type of Home: House Home Access: Stairs to enter Technical brewer of Steps: 3 Entrance Stairs-Rails: None Home Layout: Two  level;1/2 bath on main level;Bed/bath upstairs Alternate Level Stairs-Number of Steps: 10 Alternate Level Stairs-Rails: Right;Left Bathroom Shower/Tub: Tub/shower unit;Curtain Shower/tub characteristics: Architectural technologist: Standard     Home Equipment: None          Prior Functioning/Environment Level of Independence: Independent             OT Diagnosis: Acute pain   OT Problem List:  Decreased activity tolerance;Decreased knowledge of use of DME or AE;Decreased knowledge of precautions;Pain   OT Treatment/Interventions:      OT Goals(Current goals can be found in the care plan section) Acute Rehab OT Goals Patient Stated Goal: to go home, no pain OT Goal Formulation: All assessment and education complete, DC therapy  OT Frequency:     Barriers to D/C:            Co-evaluation              End of Session    Activity Tolerance: Patient tolerated treatment well Patient left: in chair;with call bell/phone within reach   Time: 0812-0831 OT Time Calculation (min): 19 min Charges:  OT General Charges $OT Visit: 1 Procedure OT Evaluation $Initial OT Evaluation Tier I: 1 Procedure G-Codes: OT G-codes **NOT FOR INPATIENT CLASS** Functional Assessment Tool Used: clinical judgment Functional Limitation: Self care Self Care Current Status [id number]): At least 20 percent but less than 40 percent impaired, limited or restricted Self Care Goal Status [id number]): At least 20 percent but less than 40 percent impaired, limited or restricted Self Care Discharge Status [phone]): At least 20 percent but less than 40 percent impaired, limited or restricted  Erica Nguyen A 11/23/2015, 8:41 AM

## 2015-11-23 NOTE — Progress Notes (Signed)
Subjective: 1 Day Post-Op Procedure(s) (LRB): DECOMPRESSIVE LUMBAR LAMINECTOMY L5-S1 ON RIGHT,FORAMINOTOMIES L5,S1 DISCECTOMY L5,S1 (Right) Patient reports pain as mild.   Seen in AM rounds by myself and Dr. Tonita Cong. Reports leg pain resolved. Incisional back pain well controlled. Feels ready for D/C home today.  Objective: Vital signs in last 24 hours: Temp:  [97.4 F (36.3 C)-98.6 F (37 C)] 97.7 F (36.5 C) (12/01 1000) Pulse Rate:  [84-108] 100 (12/01 1000) Resp:  [16-18] 16 (12/01 1000) BP: (120-151)/(70-92) 122/78 mmHg (12/01 1000) SpO2:  [98 %-100 %] 99 % (12/01 0633)  Intake/Output from previous day: 11/30 0701 - 12/01 0700 In: 3972.5 [P.O.:1440; I.V.:2032.5; IV Piggyback:500] Out: 1300 [Urine:1300] Intake/Output this shift: Total I/O In: 270 [P.O.:120; I.V.:150] Out: -   No results for input(s): HGB in the last 72 hours. No results for input(s): WBC, RBC, HCT, PLT in the last 72 hours.  Recent Labs  11/23/15 0430  NA 134*  K 4.1  CL 102  CO2 24  BUN 13  CREATININE 0.53  GLUCOSE 370*  CALCIUM 8.2*   No results for input(s): LABPT, INR in the last 72 hours.  Neurologically intact ABD soft Neurovascular intact Sensation intact distally Intact pulses distally Dorsiflexion/Plantar flexion intact Incision: dressing C/D/I and no drainage No cellulitis present Compartment soft no sign of DVT  Assessment/Plan: 1 Day Post-Op Procedure(s) (LRB): DECOMPRESSIVE LUMBAR LAMINECTOMY L5-S1 ON RIGHT,FORAMINOTOMIES L5,S1 DISCECTOMY L5,S1 (Right) Advance diet  Up with therapy Discussed D/C instructions, Lspine precautions, dressing instructions D/C home today Follow up in office in 2 weeks  Yassir Enis M. 11/23/2015, 10:28 AM

## 2015-11-23 NOTE — Op Note (Signed)
NAMEYAMNA, SCHLOUGH                 ACCOUNT NO.:  [id number]  MEDICAL RECORD NO.:  [id number]  LOCATION:  [id number]                         FACILITY:  [facility]  PHYSICIAN:  Susa Day, M.D.    DATE OF BIRTH:  02/21/1971  DATE OF PROCEDURE:  11/22/2015 DATE OF DISCHARGE:                              OPERATIVE REPORT   PREOPERATIVE DIAGNOSES: 1. Herniated nucleus pulposus spinal stenosis at L5-S1, right. 2. Elevated BMI of 38.  POSTOPERATIVE DIAGNOSIS:  Herniated nucleus pulposus spinal stenosis at L5-S1, right.  PROCEDURES PERFORMED: 1. Microlumbar decompression at L5-S1, right. 2. Foraminotomies at L5-S1, right. 3. Microdiskectomy at L5-S1, right. 4. Technical difficulty increased due to the patient's elevated BMI.  ANESTHESIA:  General.  ASSISTANT:  Cleophas Dunker, PA.  HISTORY:  This is a 44 year old female, who has HNP at L5-S1 with severe right lower extremity radicular pain secondary to disk herniation compressing the S1 nerve root.  She had associated disk degeneration, neural foraminal narrowing, failed conservative treatment, presence of neurologic deficit, was indicated for decompression at L5-S1, foraminotomies at L5-S1 and microdiskectomy.  Risks and benefits were discussed including bleeding, infection, damage to neurovascular structures, DVT, PE, anesthetic complications, recurrent disk herniation, need for fusion in the future, etc.  TECHNIQUE:  With the patient in supine position, after induction of adequate general anesthesia, and 1.5 g of vancomycin and Kefzol preoperatively due to MRSA, she was placed prone on the Clay frame. All bony prominences were well padded.  Lumbar region was prepped and draped in usual sterile fashion.  Two 18-gauge spinal needles were utilized to localize the L5-S1 interspace confirmed with x-ray. Incision was made from spinous process of L5-S1.  Subcutaneous tissue was dissected.  There was ample tissue, adipose tissue between  the skin and the fascia.  We identified the fascia, infiltrated with 0.25% Marcaine with epinephrine, and divided in line with the skin incision. Paraspinous muscle was divided.  The self-retaining retractor was placed.  Operating microscope was draped and brought on the surgical field.  Confirmatory radiograph obtained.  Straight curette utilized to detach the ligamentum flavum from the cephalad edge of S1.  Neuro patty placed beneath the ligamentum flavum.  I performed a generous foraminotomy of S1.  Then, removed ligamentum flavum from the interspace, performed a hemilaminotomy of the caudad edge of 5 to detach the ligamentum flavum.  The S1 nerve root was noted to be severely compressed in the lateral recess multifactorial.  I gently mobilized the S1 nerve root medially with a Penfield.  I decompressed lateral recess to the medial border of the pedicle, continued into the foramen of 5 and performed a foraminotomy of L5 as well protecting the L5 root. Following this, a focal HNP was noted extending into the foramen.  I performed an annulotomy.  Copious portion of disk material was removed from the disk space in a straight upbiting pituitary.  There were extruded fragment, cephalad and caudad, which were retrieved separately with a micropituitary and a micro nerve hook.  Multiple passes were made.  Multiple fragments were excised.  Further mobilized with __________, preserving the endplates.  Irrigated disk space with catheter irrigation.  Then, retrieved 2 additional fragments.  Following this, there were no fragments noted within the axilla, shoulder, foramen, or beneath thecal sac.  There was 1 cm of excursion of the S1 nerve root via the pedicle without tension and no probe passed freely up the foramen of S1 and L5.  No evidence of CSF leakage or active bleeding.  The bipolar electrocautery had been utilized to cauterize and divide an epidural venous plexus.  The S1 nerve root was  erythematous and edematous, but intact.  After copious irrigation and confirmatory radiograph at L5-S1, I removed the McCullough retractor, irrigated the paraspinous musculature.  No active bleeding.  Closed with #1 Vicryl interrupted figure-of-eight sutures, subcu with multiple 2-0 layers due to the ample subcutaneous tissue.  We had used extra long retractors and Kerrisons for the procedure due to the elevated BMI.  Skin was reapproximated with Prolene.  Sterile dressing applied.  Placed supine on the hospital bed, extubated without difficulty, and transported to the recovery room in satisfactory condition.  The patient tolerated the procedure well.  No complications.  No blood loss. Assistant, Cleophas Dunker.     Susa Day, M.D.     Geralynn Rile  D:  11/22/2015  T:  11/23/2015  Job:  [id number]

## 2015-11-23 NOTE — Discharge Summary (Signed)
Physician Discharge Summary   Patient ID: GISSELLA NIBLACK MRN: [id number] DOB/AGE: February 01, 1971 44 y.o.  Admit date: 11/22/2015 Discharge date: 11/23/2015  Primary Diagnosis:   HERNIATED NUCLEUS PULPOSA L5-S1 ON RIGHT   Admission Diagnoses:  Past Medical History  Diagnosis Date  . Hypertension     chronic hypertension  . Diabetes mellitus     insulin-dependent  . Fibroid   . Abnormal Pap smear   . Family history of adverse reaction to anesthesia     sister has n/v  . HNP (herniated nucleus pulposus)    Discharge Diagnoses:   Active Problems:   Spinal stenosis of lumbar region  Procedure:  Procedure(s) (LRB): DECOMPRESSIVE LUMBAR LAMINECTOMY L5-S1 ON RIGHT,FORAMINOTOMIES L5,S1 DISCECTOMY L5,S1 (Right)   Consults: None  HPI:  see H&P    Laboratory Data: Hospital Outpatient Visit on 11/15/2015  Component Date Value Ref Range Status  . Preg, Serum 11/15/2015 NEGATIVE  NEGATIVE Final   Comment:        THE SENSITIVITY OF THIS METHODOLOGY IS >10 mIU/mL.   Marland Kitchen Sodium 11/15/2015 135  135 - 145 mmol/L Final  . Potassium 11/15/2015 3.8  3.5 - 5.1 mmol/L Final  . Chloride 11/15/2015 100* 101 - 111 mmol/L Final  . CO2 11/15/2015 26  22 - 32 mmol/L Final  . Glucose, Bld 11/15/2015 323* 65 - 99 mg/dL Final  . BUN 11/15/2015 13  6 - 20 mg/dL Final  . Creatinine, Ser 11/15/2015 0.60  0.44 - 1.00 mg/dL Final  . Calcium 11/15/2015 9.2  8.9 - 10.3 mg/dL Final  . GFR calc non Af Amer 11/15/2015 >60  >60 mL/min Final  . GFR calc Af Amer 11/15/2015 >60  >60 mL/min Final   Comment: (NOTE) The eGFR has been calculated using the CKD EPI equation. This calculation has not been validated in all clinical situations. eGFR's persistently <60 mL/min signify possible Chronic Kidney Disease.   . Anion gap 11/15/2015 9  5 - 15 Final  . WBC 11/15/2015 10.3  4.0 - 10.5 K/uL Final  . RBC 11/15/2015 4.40  3.87 - 5.11 MIL/uL Final  . Hemoglobin 11/15/2015 13.2  12.0 - 15.0 g/dL Final  . HCT  11/15/2015 37.7  36.0 - 46.0 % Final  . MCV 11/15/2015 85.7  78.0 - 100.0 fL Final  . MCH 11/15/2015 30.0  26.0 - 34.0 pg Final  . MCHC 11/15/2015 35.0  30.0 - 36.0 g/dL Final  . RDW 11/15/2015 12.8  11.5 - 15.5 % Final  . Platelets 11/15/2015 264  150 - 400 K/uL Final  . Hgb A1c MFr Bld 11/15/2015 10.5* 4.8 - 5.6 % Final   Comment: (NOTE)         Pre-diabetes: 5.7 - 6.4         Diabetes: >6.4         Glycemic control for adults with diabetes: <7.0   . Mean Plasma Glucose 11/15/2015 255   Final   Comment: (NOTE) Performed At: [facility] [place], [place] [id number] Lindon Romp MD BZ:[id number]   . MRSA, PCR 11/15/2015 POSITIVE* NEGATIVE Final   Comment: RESULT CALLED TO, READ BACK BY AND VERIFIED WITH: DARLENE REED,RN [id number] @ 0175 BY J SCOTTON   . Staphylococcus aureus 11/15/2015 POSITIVE* NEGATIVE Final   Comment:        The Xpert SA Assay (FDA approved for NASAL specimens in patients over 92 years of age), is one component of a comprehensive surveillance program.  Test performance has been  validated by [facility] for patients greater than or equal to 71 year old. It is not intended to diagnose infection nor to guide or monitor treatment. RESULT CALLED TO, READ BACK BY AND VERIFIED WITH: DARLENE REED,RN [id number] @ 1322 BY J SCOTTON    No results for input(s): HGB in the last 72 hours. No results for input(s): WBC, RBC, HCT, PLT in the last 72 hours.  Recent Labs  11/23/15 0430  NA 134*  K 4.1  CL 102  CO2 24  BUN 13  CREATININE 0.53  GLUCOSE 370*  CALCIUM 8.2*   No results for input(s): LABPT, INR in the last 72 hours.  X-Rays:Dg Lumbar Spine 2-3 Views  11/15/2015  CLINICAL DATA:  44 year old female with a history of lumbar back pain. EXAM: LUMBAR SPINE - 2-3 VIEW COMPARISON:  None. FINDINGS: Lumbar Spine: Lumbar vertebral elements maintain normal alignment without evidence of anterolisthesis, retrolisthesis, subluxation. No  fracture line identified. Vertebral body heights maintained. Disc space narrowing with endplate sclerosis at the L5-S1 level. Minimal facet changes. Surgical changes of cholecystectomy. IMPRESSION: No acute fracture or malalignment of the lumbar spine. Mild degenerative disc disease of L5-S1. Signed, Dulcy Fanny. Earleen Newport, DO [facility] Rad[facility] Electronically Signed   By: Corrie Mckusick D.O.   On: 11/15/2015 09:46   Dg Spine Portable 1 View  11/22/2015  CLINICAL DATA:  L5-S1 lumbar decompression. EXAM: PORTABLE SPINE - 1 VIEW COMPARISON:  Same day. FINDINGS: Single cross-table lateral intraoperative projection of the lumbar spine was obtained. This demonstrates surgical probe overlying the L5-S1 disc space. IMPRESSION: Surgical localization of L5-S1. Electronically Signed   By: Marijo Conception, M.D.   On: 11/22/2015 09:04   Dg Spine Portable 1 View  11/22/2015  CLINICAL DATA:  44 year old female undergoing lumbar spine surgery. Initial encounter. EXAM: PORTABLE SPINE - 1 VIEW COMPARISON:  Intraoperative radiographs 0731 hours the same day and earlier. FINDINGS: Normal lumbar segmentation noted on the comparisons. Intraoperative portable cross-table lateral view of the lumbar spine labeled image #2 at 0801 hours. Surgical probe directed at the S1 level. IMPRESSION: Intraoperative localization at S1. Electronically Signed   By: Genevie Ann M.D.   On: 11/22/2015 08:28   Dg Spine Portable 1 View  11/22/2015  CLINICAL DATA:  44 year old female undergoing lumbar surgery. Initial encounter. EXAM: PORTABLE SPINE - 1 VIEW COMPARISON:  Preoperative lumbar radiographs 11/15/2015. FINDINGS: Normal lumbar segmentation demonstrated on the comparison radiographs. Intraoperative portable cross-table lateral view of the lumbar spine labeled image #1 at 0731 hours. Cephalad needle is directed at the superior L5 level, just above the L5 spinous process. Caudal needle is directed at  the mid S1 level. IMPRESSION: Intraoperative localization as above. Electronically Signed   By: Genevie Ann M.D.   On: 11/22/2015 08:13    EKG: Orders placed or performed during the hospital encounter of 06/22/11  . EKG  . EKG     Hospital Course: Patient was admitted to [facility] and taken to the OR and underwent the above state procedure without complications.  Patient tolerated the procedure well and was later transferred to the recovery room and then to the orthopaedic floor for postoperative care.  They were given PO and IV analgesics for pain control following their surgery.  They were given 24 hours of postoperative antibiotics.   PT was consulted postop to assist with mobility and transfers.  The patient was allowed to be WBAT with therapy and was taught back precautions. Discharge  planning was consulted to help with postop disposition and equipment needs.  Patient had a good night on the evening of surgery and started to get up OOB with therapy on day one. Patient was seen in rounds and was ready to go home on day one.  They were given discharge instructions and dressing directions.  They were instructed on when to follow up in the office with Dr. Tonita Cong.   Diet: Diabetic diet Activity:WBAT; Lspine precautions Follow-up:in 10-14 days Disposition - Home Discharged Condition: good   Discharge Instructions    Call MD / Call 911    Complete by:  As directed   If you experience chest pain or shortness of breath, CALL 911 and be transported to the hospital emergency room.  If you develope a fever above 101 F, pus (white drainage) or increased drainage or redness at the wound, or calf pain, call your surgeon's office.     Constipation Prevention    Complete by:  As directed   Drink plenty of fluids.  Prune juice may be helpful.  You may use a stool softener, such as Colace (over the counter) 100 mg twice a day.  Use MiraLax (over the counter) for constipation as needed.     Diet -  low sodium heart healthy    Complete by:  As directed      Increase activity slowly as tolerated    Complete by:  As directed             Medication List    STOP taking these medications        HYDROcodone-acetaminophen 10-325 MG tablet  Commonly known as:  NORCO      TAKE these medications        docusate sodium 100 MG capsule  Commonly known as:  COLACE  Take 1 capsule (100 mg total) by mouth 2 (two) times daily as needed for mild constipation.     insulin detemir 100 UNIT/ML injection  Commonly known as:  LEVEMIR  Inject 25 Units into the skin every evening.     lisinopril 10 MG tablet  Commonly known as:  PRINIVIL,ZESTRIL  Take 10 mg by mouth daily.     methocarbamol 500 MG tablet  Commonly known as:  ROBAXIN  Take 1 tablet (500 mg total) by mouth every 8 (eight) hours as needed for muscle spasms.     oxyCODONE-acetaminophen 5-325 MG tablet  Commonly known as:  PERCOCET  Take 1-2 tablets by mouth every 4 (four) hours as needed.     PROBIOTIC DAILY PO  Take 1 capsule by mouth daily.     VICTOZA 18 MG/3ML Sopn  Generic drug:  Liraglutide  Inject 1.7 mg into the skin daily.           Follow-up Information    Follow up with BEANE,JEFFREY C, MD In 2 weeks.   Specialty:  Orthopedic Surgery   Why:  For suture removal   Contact information:   [address] [place] [address] [phone]       Signed: Lacie Draft, PA-C Orthopaedic Surgery 11/23/2015, 10:30 AM

## 2015-11-23 NOTE — Care Management Note (Signed)
Case Management Note  Patient Details  Name: KHALIYA ODONNELL MRN: [id number] Date of Birth: 23-Jul-1971  Subjective/Objective:  44 y.o. F admitted 01/21/2015 for Decompression Lumbar Laminectomy L5-S1 on Right. To be discharged today with no PT/OT needs.                    Action/Plan:Discharge home today with No CM needs. CM will sign off but will be available should CM needs arise.    Expected Discharge Date:                  Expected Discharge Plan:  Home/Self Care  In-House Referral:     Discharge planning Services  CM Consult  Post Acute Care Choice:    Choice offered to:     DME Arranged:    DME Agency:     HH Arranged:    [place] Agency:     Status of Service:  Completed, signed off  Medicare Important Message Given:    Date Medicare IM Given:    Medicare IM give by:    Date Additional Medicare IM Given:    Additional Medicare Important Message give by:     If discussed at [place] of Stay Meetings, dates discussed:    Additional Comments:  Delrae Sawyers, RN 11/23/2015, 10:23 AM

## 2015-11-29 DIAGNOSIS — B3731 Acute candidiasis of vulva and vagina: Secondary | ICD-10-CM | POA: Insufficient documentation

## 2015-11-29 DIAGNOSIS — B373 Candidiasis of vulva and vagina: Secondary | ICD-10-CM | POA: Insufficient documentation

## 2016-01-23 DIAGNOSIS — E7800 Pure hypercholesterolemia, unspecified: Secondary | ICD-10-CM | POA: Insufficient documentation

## 2016-01-23 DIAGNOSIS — E559 Vitamin D deficiency, unspecified: Secondary | ICD-10-CM | POA: Insufficient documentation

## 2016-04-11 IMAGING — CR DG LUMBAR SPINE 2-3V
2 series · 2 of 2 positions shown · non-contrast
Comparison: None.

ADDENDUM:
Addendum created to address numbering of the lumbar spine. Five non
rib-bearing lumbar type vertebral bodies present, as indicated on
the lateral view.
CLINICAL DATA: 43-year-old female with a history of lumbar back
pain.

EXAM:
LUMBAR SPINE - 2-3 VIEW

[w l-spine a.p. *]
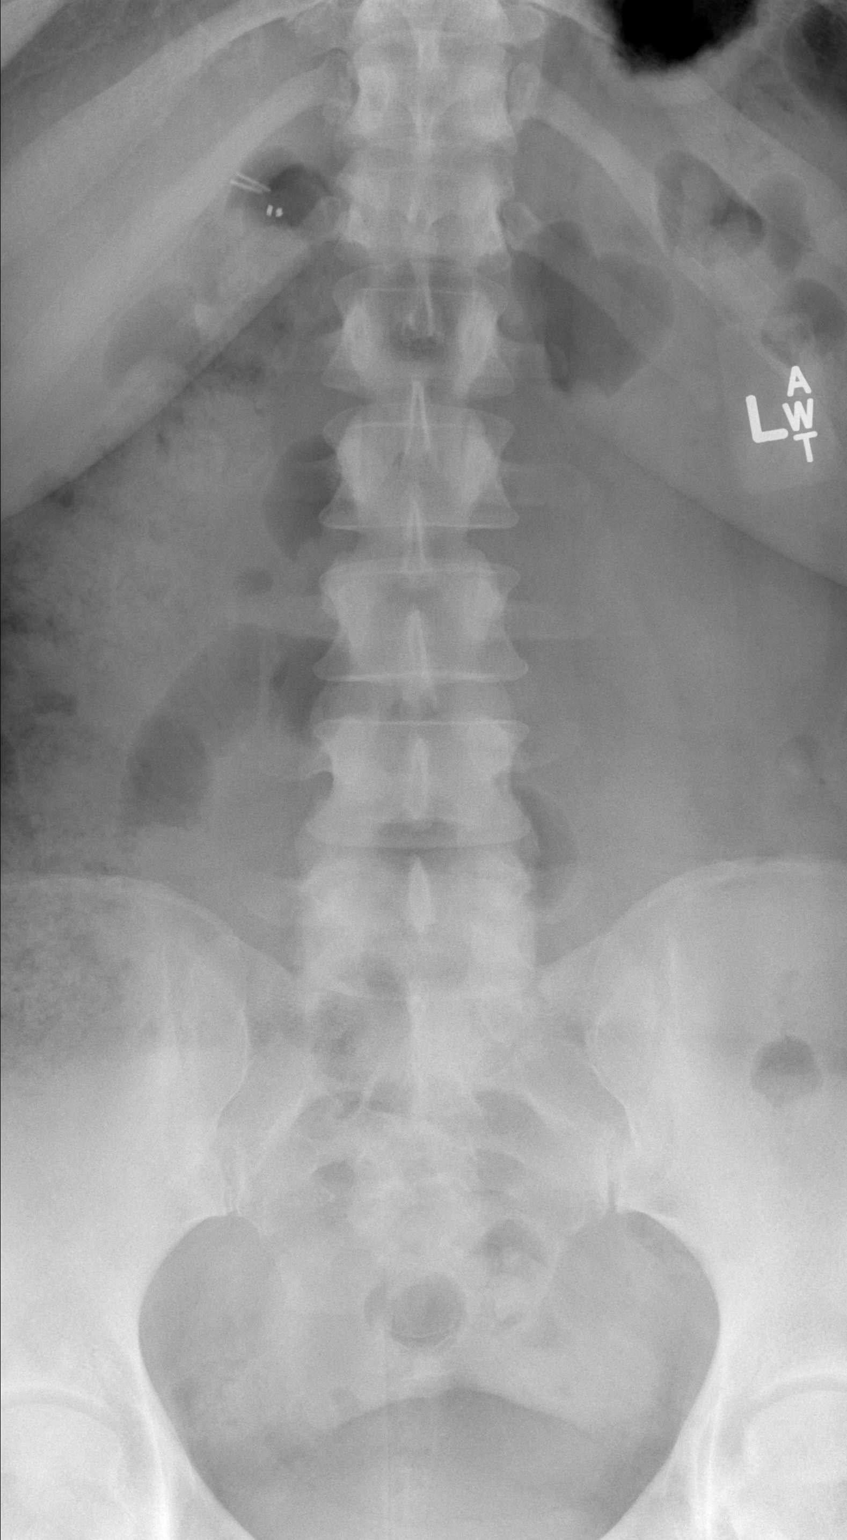

[w l-spine lat *]
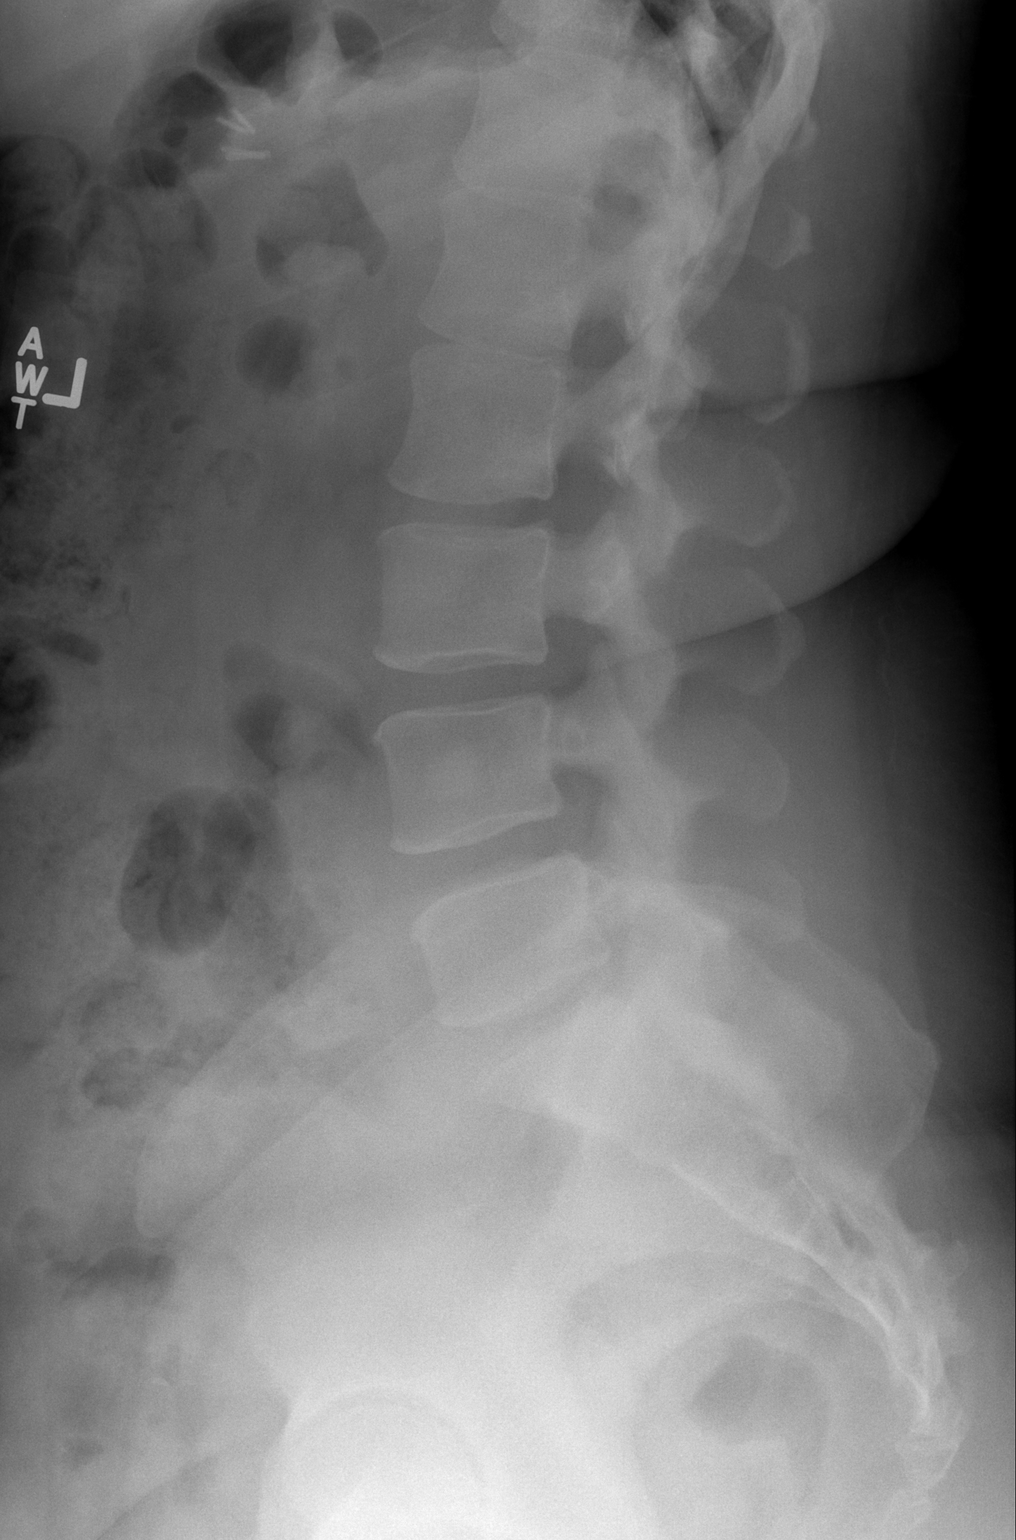

[2 of 2 positions shown; findings below may reference images not displayed]

FINDINGS: Lumbar Spine:

Lumbar vertebral elements maintain normal alignment without evidence
of anterolisthesis, retrolisthesis, subluxation.

No fracture line identified. Vertebral body heights maintained.

Disc space narrowing with endplate sclerosis at the L5-S1 level.
Minimal facet changes.

Surgical changes of cholecystectomy.
IMPRESSION: No acute fracture or malalignment of the lumbar spine.

Mild degenerative disc disease of L5-S1.

## 2016-06-05 ENCOUNTER — Other Ambulatory Visit: Payer: Self-pay | Admitting: Obstetrics and Gynecology

## 2016-06-05 ENCOUNTER — Other Ambulatory Visit: Payer: Self-pay | Admitting: Obstetrics & Gynecology

## 2016-06-05 DIAGNOSIS — N6311 Unspecified lump in the right breast, upper outer quadrant: Secondary | ICD-10-CM

## 2016-06-15 ENCOUNTER — Inpatient Hospital Stay (HOSPITAL_COMMUNITY)
Admission: AD | Admit: 2016-06-15 | Discharge: 2016-06-15 | Disposition: A | Discharge status: Home / Self Care | Payer: Managed Care, Other (non HMO) | Source: Ambulatory Visit | Attending: Obstetrics and Gynecology | Admitting: Obstetrics and Gynecology

## 2016-06-15 ENCOUNTER — Encounter (HOSPITAL_COMMUNITY): Payer: Self-pay

## 2016-06-15 DIAGNOSIS — I10 Essential (primary) hypertension: Secondary | ICD-10-CM | POA: Diagnosis not present

## 2016-06-15 DIAGNOSIS — A499 Bacterial infection, unspecified: Secondary | ICD-10-CM | POA: Diagnosis not present

## 2016-06-15 DIAGNOSIS — N760 Acute vaginitis: Secondary | ICD-10-CM | POA: Diagnosis not present

## 2016-06-15 DIAGNOSIS — B9689 Other specified bacterial agents as the cause of diseases classified elsewhere: Secondary | ICD-10-CM

## 2016-06-15 DIAGNOSIS — Z880 Allergy status to penicillin: Secondary | ICD-10-CM | POA: Insufficient documentation

## 2016-06-15 DIAGNOSIS — Z794 Long term (current) use of insulin: Secondary | ICD-10-CM | POA: Insufficient documentation

## 2016-06-15 DIAGNOSIS — E119 Type 2 diabetes mellitus without complications: Secondary | ICD-10-CM | POA: Diagnosis not present

## 2016-06-15 DIAGNOSIS — N399 Disorder of urinary system, unspecified: Secondary | ICD-10-CM | POA: Diagnosis present

## 2016-06-15 LAB — WET PREP, GENITAL
Sperm: NONE SEEN
Trich, Wet Prep: NONE SEEN
YEAST WET PREP: NONE SEEN

## 2016-06-15 LAB — URINALYSIS, ROUTINE W REFLEX MICROSCOPIC
Bilirubin Urine: NEGATIVE
Glucose, UA: >1000 mg/dL — AB
KETONES UR: NEGATIVE mg/dL
LEUKOCYTES UA: NEGATIVE
Nitrite: NEGATIVE
PROTEIN: NEGATIVE mg/dL
Specific Gravity, Urine: 1.01 (ref 1.005–1.030)
pH: 5.5 (ref 5.0–8.0)

## 2016-06-15 LAB — URINE MICROSCOPIC-ADD ON

## 2016-06-15 LAB — POCT PREGNANCY, URINE: Preg Test, Ur: NEGATIVE

## 2016-06-15 MED ORDER — METRONIDAZOLE 500 MG PO TABS: 500.0000 mg | ORAL_TABLET | Freq: Three times a day (TID) | ORAL | Status: DC

## 2016-06-15 NOTE — MAU Note (Signed)
Since Thursday urine is real cloudy and foul odor, and thinks has bacterial vaginosis, lower abdominal pain.

## 2016-06-15 NOTE — Discharge Instructions (Signed)

## 2016-06-15 NOTE — MAU Provider Note (Signed)
History   Erica Nguyen is a [id number] no preg in with c/o freq BV infections and sure she has one now and also her urine is cloudy with a odor.States started last Thursday. Denies any STD risk. Med hx is pos for diabetes.  CSN: [id number]  Arrival date & time 06/15/16  1730   First Provider Initiated Contact with Patient 06/15/16 1801      Chief Complaint  Patient presents with  . Urinary Tract Infection    HPI  Past Medical History  Diagnosis Date  . Hypertension     chronic hypertension  . Diabetes mellitus     insulin-dependent  . Fibroid   . Abnormal Pap smear   . Family history of adverse reaction to anesthesia     sister has n/v  . HNP (herniated nucleus pulposus)     Past Surgical History  Procedure Laterality Date  . Cardiac catheterization  02/29/2008    Normal coronary arteries -- Normal LV (left ventricular) systolic function -- Mild to moderate elevation in left ventricular end-diastolic  pressure secondary to hypertension, diabetes and obesity  . Novasure ablation  01/08/2008  . Cholecystectomy  1995  . Hysteroscopy, d&c, novasure ablation, removal of intrauterine device  01/08/2008  . Gynecologic cryosurgery  1998  . Decompressive lumbar laminectomy level 1 Right 11/22/2015    Procedure: DECOMPRESSIVE LUMBAR LAMINECTOMY L5-S1 ON RIGHT,FORAMINOTOMIES L5,S1 DISCECTOMY L5,S1;  Surgeon: Susa Day, MD;  Location: [facility];  Service: Orthopedics;  Laterality: Right;    Family History  Problem Relation Age of Onset  . Heart disease Mother   . Diabetes Mother   . Hypertension Father   . Diabetes Father   . Kidney failure Father   . Stroke Father   . Hypertension Paternal Grandmother   . Diabetes Paternal Grandmother   . Stroke Paternal Grandmother   . Diabetes Maternal Grandmother   . Ovarian cancer Maternal Grandmother   . Diabetes Maternal Grandfather   . Diabetes Paternal Grandfather     Social History  Substance Use Topics  . Smoking status: Never  Smoker   . Smokeless tobacco: Never Used  . Alcohol Use: No    OB History    Gravida Para Term Preterm AB TAB SAB Ectopic Multiple Living   3 1 1  2  2   1       Review of Systems  Constitutional: Negative.   HENT: Negative.   Eyes: Negative.   Respiratory: Negative.   Cardiovascular: Negative.   Gastrointestinal: Negative.   Endocrine: Negative.   Genitourinary: Positive for vaginal discharge.  Musculoskeletal: Negative.   Skin: Negative.   Allergic/Immunologic: Negative.   Neurological: Negative.   Hematological: Negative.   Psychiatric/Behavioral: Negative.     Allergies  Amoxicillin  Home Medications  No current outpatient prescriptions on file.  BP 149/70 mmHg  Pulse 93  Temp(Src) 98 F (36.7 C) (Oral)  Resp 18  Ht 5\' 6"  (1.676 m)  Wt 230 lb (104.327 kg)  BMI 37.14 kg/m2  LMP 06/03/2016  Physical Exam  Constitutional: She is oriented to person, place, and time. She appears well-developed and well-nourished.  HENT:  Head: Normocephalic.  Eyes: Pupils are equal, round, and reactive to light.  Neck: Normal range of motion.  Cardiovascular: Normal rate, regular rhythm, normal heart sounds and intact distal pulses.   Pulmonary/Chest: Effort normal and breath sounds normal.  Abdominal: Soft. Bowel sounds are normal.  Genitourinary: Vagina normal and uterus normal.  Musculoskeletal: Normal range of motion.  Neurological:  She is alert and oriented to person, place, and time. She has normal reflexes.  Skin: Skin is warm and dry.  Psychiatric: She has a normal mood and affect. Her behavior is normal. Judgment and thought content normal.    MAU Course  Procedures (including critical care time)  Labs Reviewed  WET PREP, GENITAL  URINALYSIS, ROUTINE W REFLEX MICROSCOPIC (NOT AT [facility])  POCT PREGNANCY, URINE   No results found.   No diagnosis found.  DX: BV  MDM  Exam WNL wet prep shows BV, will tx  And d/c home urine WNL.

## 2016-06-17 ENCOUNTER — Other Ambulatory Visit: Payer: Managed Care, Other (non HMO)

## 2016-08-20 ENCOUNTER — Other Ambulatory Visit: Payer: Managed Care, Other (non HMO)

## 2016-08-28 ENCOUNTER — Other Ambulatory Visit: Payer: Managed Care, Other (non HMO)

## 2016-09-02 ENCOUNTER — Ambulatory Visit
Admission: RE | Admit: 2016-09-02 | Discharge: 2016-09-02 | Disposition: A | Discharge status: Home / Self Care | Payer: Managed Care, Other (non HMO) | Source: Ambulatory Visit | Attending: Obstetrics and Gynecology | Admitting: Obstetrics and Gynecology

## 2016-09-02 DIAGNOSIS — N6311 Unspecified lump in the right breast, upper outer quadrant: Secondary | ICD-10-CM

## 2016-09-03 ENCOUNTER — Other Ambulatory Visit: Payer: Self-pay | Admitting: Obstetrics & Gynecology

## 2016-09-03 DIAGNOSIS — N6311 Unspecified lump in the right breast, upper outer quadrant: Secondary | ICD-10-CM

## 2016-10-07 ENCOUNTER — Ambulatory Visit: Payer: Self-pay | Admitting: Orthopedic Surgery

## 2016-10-08 ENCOUNTER — Ambulatory Visit: Payer: Self-pay | Admitting: Orthopedic Surgery

## 2016-10-08 NOTE — Patient Instructions (Addendum)
Erica Nguyen  10/08/2016   Your procedure is scheduled on: Friday 10/11/2016  Report to [facility] Main  Entrance take [facility]  elevators to 3rd floor to  [place] at  1030 AM.  Call this number if you have problems the morning of surgery [phone]   Remember: ONLY 1 PERSON MAY GO WITH YOU TO SHORT STAY TO GET  READY MORNING OF [place].   Do not eat food or drink liquids :After Midnight.     Take these medicines the morning of surgery with A SIP OF WATER: Gabapentin, oxycodone if needed  DO NOT TAKE ANY DIABETIC MEDICATIONS DAY OF YOUR SURGERY                               You may not have any metal on your body including hair pins and              piercings  Do not wear jewelry, make-up, lotions, powders or perfumes, deodorant             Do not wear nail polish.  Do not shave  48 hours prior to surgery.              Men may shave face and neck.   Do not bring valuables to the hospital. [place].  Contacts, dentures or bridgework may not be worn into surgery.  Leave suitcase in the car. After surgery it may be brought to your room.                  Please read over the following fact sheets you were given: _____________________________________________________________________             [facility] - Preparing for Surgery Before surgery, you can play an important role.  Because skin is not sterile, your skin needs to be as free of germs as possible.  You can reduce the number of germs on your skin by washing with CHG (chlorahexidine gluconate) soap before surgery.  CHG is an antiseptic cleaner which kills germs and bonds with the skin to continue killing germs even after washing. Please DO NOT use if you have an allergy to CHG or antibacterial soaps.  If your skin becomes reddened/irritated stop using the CHG and inform your nurse when you arrive at Short Stay. Do not shave (including  legs and underarms) for at least 48 hours prior to the first CHG shower.  You may shave your face/neck. Please follow these instructions carefully:  1.  Shower with CHG Soap the night before surgery and the  morning of Surgery.  2.  If you choose to wash your hair, wash your hair first as usual with your  normal  shampoo.  3.  After you shampoo, rinse your hair and body thoroughly to remove the  shampoo.                           4.  Use CHG as you would any other liquid soap.  You can apply chg directly  to the skin and wash  Gently with a scrungie or clean washcloth.  5.  Apply the CHG Soap to your body ONLY FROM THE NECK DOWN.   Do not use on face/ open                           Wound or open sores. Avoid contact with eyes, ears mouth and genitals (private parts).                       Wash face,  Genitals (private parts) with your normal soap.             6.  Wash thoroughly, paying special attention to the area where your surgery  will be performed.  7.  Thoroughly rinse your body with warm water from the neck down.  8.  DO NOT shower/wash with your normal soap after using and rinsing off  the CHG Soap.                9.  Pat yourself dry with a clean towel.            10.  Wear clean pajamas.            11.  Place clean sheets on your bed the night of your first shower and do not  sleep with pets. Day of Surgery : Do not apply any lotions/deodorants the morning of surgery.  Please wear clean clothes to the hospital/surgery center.  FAILURE TO FOLLOW THESE INSTRUCTIONS MAY RESULT IN THE CANCELLATION OF YOUR SURGERY  ________________________________________________________________________   Incentive Spirometer  An incentive spirometer is a tool that can help keep your lungs clear and active. This tool measures how well you are filling your lungs with each breath. Taking long deep breaths may help reverse or decrease the chance of developing breathing (pulmonary)  problems (especially infection) following:  A long period of time when you are unable to move or be active. BEFORE THE PROCEDURE   If the spirometer includes an indicator to show your best effort, your nurse or respiratory therapist will set it to a desired goal.  If possible, sit up straight or lean slightly forward. Try not to slouch.  Hold the incentive spirometer in an upright position. INSTRUCTIONS FOR USE  1. Sit on the edge of your bed if possible, or sit up as far as you can in bed or on a chair. 2. Hold the incentive spirometer in an upright position. 3. Breathe out normally. 4. Place the mouthpiece in your mouth and seal your lips tightly around it. 5. Breathe in slowly and as deeply as possible, raising the piston or the ball toward the top of the column. 6. Hold your breath for 3-5 seconds or for as long as possible. Allow the piston or ball to fall to the bottom of the column. 7. Remove the mouthpiece from your mouth and breathe out normally. 8. Rest for a few seconds and repeat Steps 1 through 7 at least 10 times every 1-2 hours when you are awake. Take your time and take a few normal breaths between deep breaths. 9. The spirometer may include an indicator to show your best effort. Use the indicator as a goal to work toward during each repetition. 10. After each set of 10 deep breaths, practice coughing to be sure your lungs are clear. If you have an incision (the cut made at the time of surgery), support your incision when coughing  by placing a pillow or rolled up towels firmly against it. Once you are able to get out of bed, walk around indoors and cough well. You may stop using the incentive spirometer when instructed by your caregiver.  RISKS AND COMPLICATIONS  Take your time so you do not get dizzy or light-headed.  If you are in pain, you may need to take or ask for pain medication before doing incentive spirometry. It is harder to take a deep breath if you are having  pain. AFTER USE  Rest and breathe slowly and easily.  It can be helpful to keep track of a log of your progress. Your caregiver can provide you with a simple table to help with this. If you are using the spirometer at home, follow these instructions: [place] IF:   You are having difficultly using the spirometer.  You have trouble using the spirometer as often as instructed.  Your pain medication is not giving enough relief while using the spirometer.  You develop fever of 100.5 F (38.1 C) or higher. SEEK IMMEDIATE MEDICAL CARE IF:   You cough up bloody sputum that had not been present before.  You develop fever of 102 F (38.9 C) or greater.  You develop worsening pain at or near the incision site. MAKE SURE YOU:   Understand these instructions.  Will watch your condition.  Will get help right away if you are not doing well or get worse. Document Released: 04/21/2007 Document Revised: 03/02/2012 Document Reviewed: 06/22/2007 ExitCare Patient Information 2014 ExitCare, [place].   ________________________________________________________________________  WHAT IS A BLOOD TRANSFUSION? Blood Transfusion Information  A transfusion is the replacement of blood or some of its parts. Blood is made up of multiple cells which provide different functions.  Red blood cells carry oxygen and are used for blood loss replacement.  White blood cells fight against infection.  Platelets control bleeding.  Plasma helps clot blood.  Other blood products are available for specialized needs, such as hemophilia or other clotting disorders. BEFORE THE TRANSFUSION  Who gives blood for transfusions?   Healthy volunteers who are fully evaluated to make sure their blood is safe. This is blood bank blood. Transfusion therapy is the safest it has ever been in the practice of medicine. Before blood is taken from a donor, a complete history is taken to make sure that person has no history  of diseases nor engages in risky social behavior (examples are intravenous drug use or sexual activity with multiple partners). The donor's travel history is screened to minimize risk of transmitting infections, such as malaria. The donated blood is tested for signs of infectious diseases, such as HIV and hepatitis. The blood is then tested to be sure it is compatible with you in order to minimize the chance of a transfusion reaction. If you or a relative donates blood, this is often done in anticipation of surgery and is not appropriate for emergency situations. It takes many days to process the donated blood. RISKS AND COMPLICATIONS Although transfusion therapy is very safe and saves many lives, the main dangers of transfusion include:   Getting an infectious disease.  Developing a transfusion reaction. This is an allergic reaction to something in the blood you were given. Every precaution is taken to prevent this. The decision to have a blood transfusion has been considered carefully by your caregiver before blood is given. Blood is not given unless the benefits outweigh the risks. AFTER THE TRANSFUSION  Right after receiving a  blood transfusion, you will usually feel much better and more energetic. This is especially true if your red blood cells have gotten low (anemic). The transfusion raises the level of the red blood cells which carry oxygen, and this usually causes an energy increase.  The nurse administering the transfusion will monitor you carefully for complications. HOME CARE INSTRUCTIONS  No special instructions are needed after a transfusion. You may find your energy is better. Speak with your caregiver about any limitations on activity for underlying diseases you may have. SEEK MEDICAL CARE IF:   Your condition is not improving after your transfusion.  You develop redness or irritation at the intravenous (IV) site. SEEK IMMEDIATE MEDICAL CARE IF:  Any of the following symptoms  occur over the next 12 hours:  Shaking chills.  You have a temperature by mouth above 102 F (38.9 C), not controlled by medicine.  Chest, back, or muscle pain.  People around you feel you are not acting correctly or are confused.  Shortness of breath or difficulty breathing.  Dizziness and fainting.  You get a rash or develop hives.  You have a decrease in urine output.  Your urine turns a dark color or changes to pink, red, or brown. Any of the following symptoms occur over the next 10 days:  You have a temperature by mouth above 102 F (38.9 C), not controlled by medicine.  Shortness of breath.  Weakness after normal activity.  The white part of the eye turns yellow (jaundice).  You have a decrease in the amount of urine or are urinating less often.  Your urine turns a dark color or changes to pink, red, or brown. Document Released: 12/06/2000 Document Revised: 03/02/2012 Document Reviewed: 07/25/2008 ExitCare Patient Information 2014 ExitCare, [place].  _______________________________________________________________________How to Manage Your Diabetes Before and After Surgery  Why is it important to control my blood sugar before and after surgery? . Improving blood sugar levels before and after surgery helps healing and can limit problems. . A way of improving blood sugar control is eating a healthy diet by: o  Eating less sugar and carbohydrates o  Increasing activity/exercise o  Talking with your doctor about reaching your blood sugar goals . High blood sugars (greater than 180 mg/dL) can raise your risk of infections and slow your recovery, so you will need to focus on controlling your diabetes during the weeks before surgery. . Make sure that the doctor who takes care of your diabetes knows about your planned surgery including the date and location.  How do I manage my blood sugar before surgery? . Check your blood sugar at least 4 times a day, starting 2 days  before surgery, to make sure that the level is not too high or low. o Check your blood sugar the morning of your surgery when you wake up and every 2 hours until you get to the Short Stay unit. . If your blood sugar is less than 70 mg/dL, you will need to treat for low blood sugar: o Do not take insulin. o Treat a low blood sugar (less than 70 mg/dL) with  cup of clear juice (cranberry or apple), 4 glucose tablets, OR glucose gel. o Recheck blood sugar in 15 minutes after treatment (to make sure it is greater than 70 mg/dL). If your blood sugar is not greater than 70 mg/dL on recheck, call [phone] for further instructions. . Report your blood sugar to the short stay nurse when you get to Short Stay.  Marland Kitchen  If you are admitted to the hospital after surgery: o Your blood sugar will be checked by the staff and you will probably be given insulin after surgery (instead of oral diabetes medicines) to make sure you have good blood sugar levels. o The goal for blood sugar control after surgery is 80-180 mg/dL.   WHAT DO I DO ABOUT MY DIABETES MEDICATION?   Marland Kitchen Do not take oral diabetes medicines (pills) the morning of surgery.  . THE NIGHT BEFORE SURGERY, take 15  units of LEVEMIR_insulin.       . THE MORNING OF SURGERY, take NO units of __________insulin.  . The day of surgery, do not take other diabetes injectables, including Byetta (exenatide), Bydureon (exenatide ER), Victoza (liraglutide), or Trulicity (dulaglutide).  . If your CBG is greater than 220 mg/dL, you may take  of your sliding scale (correction) dose of insulin.  Patient Signature:  Date:   Nurse Signature:  Date:   Reviewed and Endorsed by [facility] Patient Education Committee, August 2015

## 2016-10-08 NOTE — H&P (Signed)
Erica Nguyen is an 45 y.o. female.   Chief Complaint: back and right leg pain HPI: The patient is a 45 year old female who presents today for follow up of their back. The patient is being followed for their low back symptoms. They are now 10 1/2 months out from decompression L5-S1 and 3 week out from a recent flare up. Symptoms reported today include: pain, numbness and leg pain. The patient states that they are doing poorly. Current treatment includes: relative rest, activity modification, pain medications and Percocet and gabapentin. The following medication has been used for pain control: Percocet and Neurontin. The patient reports their current pain level to be severe (feels worse than before surgery last year). The patient presents today following MRI.  Erica Nguyen follows up with her MRI, which is performed this morning. She is in severe pain. It radiates down the lateral aspect of the foot. No change in bowel or bladder function, fevers or chills.  Past Medical History:  Diagnosis Date  . Abnormal Pap smear   . Diabetes mellitus    insulin-dependent  . Family history of adverse reaction to anesthesia    sister has n/v  . Fibroid   . HNP (herniated nucleus pulposus)   . Hypertension    chronic hypertension    Past Surgical History:  Procedure Laterality Date  . CARDIAC CATHETERIZATION  02/29/2008   Normal coronary arteries -- Normal LV (left ventricular) systolic function -- Mild to moderate elevation in left ventricular end-diastolic  pressure secondary to hypertension, diabetes and obesity  . CHOLECYSTECTOMY  1995  . DECOMPRESSIVE LUMBAR LAMINECTOMY LEVEL 1 Right 11/22/2015   Procedure: DECOMPRESSIVE LUMBAR LAMINECTOMY L5-S1 ON RIGHT,FORAMINOTOMIES L5,S1 DISCECTOMY L5,S1;  Surgeon: Susa Day, MD;  Location: [facility] ORS;  Service: Orthopedics;  Laterality: Right;  . GYNECOLOGIC CRYOSURGERY  1998  . Hysteroscopy, D&C, Novasure ablation, removal of Intrauterine device  01/08/2008  .  Altus ABLATION  01/08/2008    Family History  Problem Relation Age of Onset  . Heart disease Mother   . Diabetes Mother   . Hypertension Father   . Diabetes Father   . Kidney failure Father   . Stroke Father   . Hypertension Paternal Grandmother   . Diabetes Paternal Grandmother   . Stroke Paternal Grandmother   . Diabetes Maternal Grandmother   . Ovarian cancer Maternal Grandmother   . Diabetes Maternal Grandfather   . Diabetes Paternal Grandfather    Social History:  reports that she has never smoked. She has never used smokeless tobacco. She reports that she does not drink alcohol or use drugs.  Allergies:  Allergies  Allergen Reactions  . Amoxicillin Other (See Comments)    Yeast Infection with AMOXIL Use Give pt yeast infection Has patient had a PCN reaction causing immediate rash, facial/tongue/throat swelling, SOB or lightheadedness with hypotension: No Has patient had a PCN reaction causing severe rash involving mucus membranes or skin necrosis: No Has patient had a PCN reaction that required hospitalization No Has patient had a PCN reaction occurring within the last 10 years: No If all of the above answers are "NO", then may proceed with Cephalosporin use.      (Not in a hospital admission)  No results found for this or any previous visit (from the past 48 hour(s)). No results found.  Review of Systems  Constitutional: Negative.   HENT: Negative.   Eyes: Negative.   Respiratory: Negative.   Cardiovascular: Negative.   Gastrointestinal: Negative.   Genitourinary: Negative.  Musculoskeletal: Positive for back pain.  Skin: Negative.   Neurological: Positive for sensory change and focal weakness.    There were no vitals taken for this visit. Physical Exam  Constitutional: She is oriented to person, place, and time. She appears well-developed. She appears distressed.  HENT:  Head: Normocephalic.  Eyes: Pupils are equal, round, and reactive to light.   Neck: Normal range of motion.  Cardiovascular: Normal rate.   Respiratory: Effort normal.  GI: Soft.  Musculoskeletal:  On exam, severe distress. Mood and affect is anxious. Straight leg raise is buttock, thigh, and calf pain. Foot pain exacerbated with dorsal augmentation maneuver. Decreased sensation in the L5-S1 dermatome. EHL is 4+/5 at plantarflexion, absent Achilles reflex on the right compared to the left. Global limited range of motion of the lumbar spine.   Neurological: She is alert and oriented to person, place, and time. She displays abnormal reflex.    MRI demonstrates a large recurrent disc herniation at L5-S1, paracentral to the right with compression at the L5-S1 nerve roots. Associated disc degeneration of L5-S1 noted as well.  Assessment/Plan Recurrent HNP L5-S1 right L5-S1 radicular pain, myotomal weakness, dermatomal dysesthesias secondary to large recurrent disc herniation at L5-S1 with compression of the L5-S1 nerve roots, myotomal weakness, dermatomal dysesthesias.  Given the presence of a large neurocompressive lesion and the presence of a neurologic deficit with severe pain, we discussed revision lumbar decompression at L5-S1.  I had an extensive discussion of the risks and benefits of the lumbar decompression with the patient including bleeding, infection, damage to neurovascular structures, epidural fibrosis, CSF leak requiring repair. We also discussed increase in pain, adjacent segment disease, recurrent disc herniation, need for future surgery including repeat decompression and/or fusion. We also discussed risks of postoperative hematoma, paralysis, anesthetic complications including DVT, PE, death, cardiopulmonary dysfunction. In addition, the perioperative and postoperative courses were discussed in detail including the rehabilitative time and return to functional activity and work. I provided the patient with an illustrated handout and utilized the appropriate  surgical models.  Her diabetes has been better maintained since she has had her insulin and her blood sugars better controlled. The stress has increased that. She does have hypertension and it is well controlled as well. She is taking gabapentin three times a day and 10/325 Percocet three times a day. We will try to obtain a stat clearance for surgical decompression and obtain additional medical information from her medical physician at [facility]. There is no injection that will be a benefit to her nor physical therapy. She is unable to actually get up from a lying down position. We may have to admit her to the hospital. We will proceed with that. We did discuss possibility of need for fusion in the future. She had no problems up until three weeks before this. She will just take an Advil and no long, history of back pain. She does have disc degeneration at L5-S1 as well. We will keep her out a note in the interim as well.  Plan revision microlumbar decompression L5-S1 right  Cecilie Kicks., PA-C for Dr. Tonita Cong 10/08/2016, 8:38 AM

## 2016-10-09 ENCOUNTER — Observation Stay (HOSPITAL_COMMUNITY)
Admission: EM | Admit: 2016-10-09 | Discharge: 2016-10-12 | Disposition: A | Discharge status: Home / Self Care | Payer: Managed Care, Other (non HMO) | Attending: Internal Medicine | Admitting: Internal Medicine

## 2016-10-09 ENCOUNTER — Encounter (HOSPITAL_COMMUNITY)
Admission: RE | Admit: 2016-10-09 | Discharge: 2016-10-09 | Disposition: A | Discharge status: Home / Self Care | Payer: Managed Care, Other (non HMO) | Source: Ambulatory Visit | Attending: Specialist | Admitting: Specialist

## 2016-10-09 ENCOUNTER — Other Ambulatory Visit: Payer: Self-pay

## 2016-10-09 ENCOUNTER — Encounter (HOSPITAL_COMMUNITY): Payer: Self-pay

## 2016-10-09 ENCOUNTER — Ambulatory Visit (HOSPITAL_COMMUNITY)
Admission: RE | Admit: 2016-10-09 | Discharge: 2016-10-09 | Disposition: A | Discharge status: Home / Self Care | Payer: Managed Care, Other (non HMO) | Source: Ambulatory Visit | Attending: Orthopedic Surgery | Admitting: Orthopedic Surgery

## 2016-10-09 DIAGNOSIS — R739 Hyperglycemia, unspecified: Secondary | ICD-10-CM | POA: Diagnosis present

## 2016-10-09 DIAGNOSIS — R52 Pain, unspecified: Secondary | ICD-10-CM

## 2016-10-09 DIAGNOSIS — I10 Essential (primary) hypertension: Secondary | ICD-10-CM | POA: Diagnosis present

## 2016-10-09 DIAGNOSIS — Z794 Long term (current) use of insulin: Secondary | ICD-10-CM | POA: Insufficient documentation

## 2016-10-09 DIAGNOSIS — M5117 Intervertebral disc disorders with radiculopathy, lumbosacral region: Secondary | ICD-10-CM | POA: Insufficient documentation

## 2016-10-09 DIAGNOSIS — R000 Tachycardia, unspecified: Secondary | ICD-10-CM | POA: Diagnosis not present

## 2016-10-09 DIAGNOSIS — R262 Difficulty in walking, not elsewhere classified: Secondary | ICD-10-CM | POA: Diagnosis not present

## 2016-10-09 DIAGNOSIS — G959 Disease of spinal cord, unspecified: Secondary | ICD-10-CM

## 2016-10-09 DIAGNOSIS — E669 Obesity, unspecified: Secondary | ICD-10-CM | POA: Insufficient documentation

## 2016-10-09 DIAGNOSIS — M5431 Sciatica, right side: Secondary | ICD-10-CM

## 2016-10-09 DIAGNOSIS — E1165 Type 2 diabetes mellitus with hyperglycemia: Secondary | ICD-10-CM | POA: Diagnosis present

## 2016-10-09 DIAGNOSIS — M48061 Spinal stenosis, lumbar region without neurogenic claudication: Secondary | ICD-10-CM | POA: Insufficient documentation

## 2016-10-09 DIAGNOSIS — M5126 Other intervertebral disc displacement, lumbar region: Secondary | ICD-10-CM

## 2016-10-09 DIAGNOSIS — R3129 Other microscopic hematuria: Secondary | ICD-10-CM | POA: Insufficient documentation

## 2016-10-09 DIAGNOSIS — M5106 Intervertebral disc disorders with myelopathy, lumbar region: Principal | ICD-10-CM | POA: Insufficient documentation

## 2016-10-09 DIAGNOSIS — Z6839 Body mass index (BMI) 39.0-39.9, adult: Secondary | ICD-10-CM | POA: Insufficient documentation

## 2016-10-09 DIAGNOSIS — IMO0001 Reserved for inherently not codable concepts without codable children: Secondary | ICD-10-CM

## 2016-10-09 DIAGNOSIS — Z419 Encounter for procedure for purposes other than remedying health state, unspecified: Secondary | ICD-10-CM

## 2016-10-09 LAB — BASIC METABOLIC PANEL
ANION GAP: 12 (ref 5–15)
Anion gap: 12 (ref 5–15)
BUN: 13 mg/dL (ref 6–20)
BUN: 13 mg/dL (ref 6–20)
CHLORIDE: 98 mmol/L — AB (ref 101–111)
CHLORIDE: 101 mmol/L (ref 101–111)
CO2: 23 mmol/L (ref 22–32)
CO2: 22 mmol/L (ref 22–32)
CREATININE: 0.67 mg/dL (ref 0.44–1.00)
Calcium: 9.4 mg/dL (ref 8.9–10.3)
Calcium: 8.9 mg/dL (ref 8.9–10.3)
Creatinine, Ser: 0.74 mg/dL (ref 0.44–1.00)
GFR calc non Af Amer: >60 mL/min (ref >60)
GLUCOSE: 362 mg/dL — AB (ref 65–99)
Glucose, Bld: 447 mg/dL — ABNORMAL HIGH (ref 65–99)
POTASSIUM: 4.3 mmol/L (ref 3.5–5.1)
Potassium: 4.3 mmol/L (ref 3.5–5.1)
SODIUM: 133 mmol/L — AB (ref 135–145)
Sodium: 135 mmol/L (ref 135–145)

## 2016-10-09 LAB — BLOOD GAS, VENOUS
ACID-BASE DEFICIT: 0.9 mmol/L (ref 0.0–2.0)
Bicarbonate: 24.3 mmol/L (ref 20.0–28.0)
O2 SAT: 64.9 %
PCO2 VEN: 44.5 mmHg (ref 44.0–60.0)
PO2 VEN: 38.5 mmHg (ref 32.0–45.0)
Patient temperature: 98.6
pH, Ven: 7.357 (ref 7.250–7.430)

## 2016-10-09 LAB — CBC WITH DIFFERENTIAL/PLATELET
Basophils Absolute: 0 10*3/uL (ref 0.0–0.1)
Basophils Relative: 0 %
EOS ABS: 0 10*3/uL (ref 0.0–0.7)
Eosinophils Relative: 0 %
HEMATOCRIT: 39.7 % (ref 36.0–46.0)
HEMOGLOBIN: 13.5 g/dL (ref 12.0–15.0)
LYMPHS ABS: 2.3 10*3/uL (ref 0.7–4.0)
Lymphocytes Relative: 17 %
MCH: 29.3 pg (ref 26.0–34.0)
MCHC: 34 g/dL (ref 30.0–36.0)
MCV: 86.1 fL (ref 78.0–100.0)
MONO ABS: 0.8 10*3/uL (ref 0.1–1.0)
MONOS PCT: 6 %
NEUTROS PCT: 77 %
Neutro Abs: 10.7 10*3/uL — ABNORMAL HIGH (ref 1.7–7.7)
Platelets: 316 10*3/uL (ref 150–400)
RBC: 4.61 MIL/uL (ref 3.87–5.11)
RDW: 13.2 % (ref 11.5–15.5)
WBC: 13.8 10*3/uL — ABNORMAL HIGH (ref 4.0–10.5)

## 2016-10-09 LAB — CBG MONITORING, ED
GLUCOSE-CAPILLARY: 330 mg/dL — AB (ref 65–99)
Glucose-Capillary: 392 mg/dL — ABNORMAL HIGH (ref 65–99)
Glucose-Capillary: 355 mg/dL — ABNORMAL HIGH (ref 65–99)

## 2016-10-09 LAB — CBC
HEMATOCRIT: 40.9 % (ref 36.0–46.0)
HEMOGLOBIN: 14.2 g/dL (ref 12.0–15.0)
MCH: 29.8 pg (ref 26.0–34.0)
MCHC: 34.7 g/dL (ref 30.0–36.0)
MCV: 85.7 fL (ref 78.0–100.0)
Platelets: 371 10*3/uL (ref 150–400)
RBC: 4.77 MIL/uL (ref 3.87–5.11)
RDW: 13.1 % (ref 11.5–15.5)
WBC: 13.6 10*3/uL — AB (ref 4.0–10.5)

## 2016-10-09 LAB — SURGICAL PCR SCREEN
MRSA, PCR: NEGATIVE
STAPHYLOCOCCUS AUREUS: NEGATIVE

## 2016-10-09 LAB — HCG, SERUM, QUALITATIVE: PREG SERUM: NEGATIVE

## 2016-10-09 LAB — GLUCOSE, CAPILLARY
Glucose-Capillary: 419 mg/dL — ABNORMAL HIGH (ref 65–99)
Glucose-Capillary: 420 mg/dL — ABNORMAL HIGH (ref 65–99)

## 2016-10-09 LAB — ABO/RH: ABO/RH(D): A POS

## 2016-10-09 MED ORDER — SODIUM CHLORIDE 0.9 % IV SOLN
INTRAVENOUS | Status: DC
  Administered 2016-10-09 – 2016-10-10 (×2): via INTRAVENOUS

## 2016-10-09 MED ORDER — METHOCARBAMOL 500 MG PO TABS
750.0000 mg | ORAL_TABLET | Freq: Three times a day (TID) | ORAL | Status: DC | PRN
Start: 2016-10-09 — End: 2016-10-11
  Administered 2016-10-09 – 2016-10-11 (×4): 750 mg via ORAL
  Filled 2016-10-09 (×4): qty 2

## 2016-10-09 MED ORDER — LISINOPRIL 5 MG PO TABS
5.0000 mg | ORAL_TABLET | Freq: Every day | ORAL | Status: DC
  Administered 2016-10-10: 5 mg via ORAL
  Filled 2016-10-09: qty 1

## 2016-10-09 MED ORDER — POLYETHYLENE GLYCOL 3350 17 G PO PACK
17.0000 g | PACK | Freq: Every day | ORAL | Status: DC
  Administered 2016-10-10 – 2016-10-12 (×2): 17 g via ORAL
  Filled 2016-10-09 (×2): qty 1

## 2016-10-09 MED ORDER — INSULIN ASPART 100 UNIT/ML ~~LOC~~ SOLN
0.0000 [IU] | Freq: Every day | SUBCUTANEOUS | Status: DC
  Administered 2016-10-10: 3 [IU] via SUBCUTANEOUS
  Administered 2016-10-11: 4 [IU] via SUBCUTANEOUS

## 2016-10-09 MED ORDER — GABAPENTIN 300 MG PO CAPS
900.0000 mg | ORAL_CAPSULE | Freq: Three times a day (TID) | ORAL | Status: DC
  Administered 2016-10-09 – 2016-10-10 (×4): 900 mg via ORAL
  Filled 2016-10-09 (×4): qty 3

## 2016-10-09 MED ORDER — MORPHINE SULFATE (PF) 4 MG/ML IV SOLN
4.0000 mg | Freq: Once | INTRAVENOUS | Status: AC
  Administered 2016-10-09: 4 mg via INTRAVENOUS
  Filled 2016-10-09: qty 1

## 2016-10-09 MED ORDER — MORPHINE SULFATE (PF) 4 MG/ML IV SOLN
4.0000 mg | INTRAVENOUS | Status: DC | PRN
  Administered 2016-10-10 – 2016-10-11 (×6): 4 mg via INTRAVENOUS
  Filled 2016-10-09 (×7): qty 1

## 2016-10-09 MED ORDER — SODIUM CHLORIDE 0.9 % IV BOLUS (SEPSIS)
1000.0000 mL | Freq: Once | INTRAVENOUS | Status: AC
  Administered 2016-10-09: 1000 mL via INTRAVENOUS

## 2016-10-09 MED ORDER — METHYLPREDNISOLONE SODIUM SUCC 125 MG IJ SOLR
125.0000 mg | Freq: Once | INTRAMUSCULAR | Status: AC
  Administered 2016-10-09: 125 mg via INTRAVENOUS
  Filled 2016-10-09: qty 2

## 2016-10-09 MED ORDER — OXYCODONE-ACETAMINOPHEN 5-325 MG PO TABS
1.0000 | ORAL_TABLET | ORAL | Status: DC | PRN
  Administered 2016-10-09: 1 via ORAL
  Filled 2016-10-09: qty 1

## 2016-10-09 MED ORDER — SENNA 8.6 MG PO TABS
2.0000 | ORAL_TABLET | Freq: Every day | ORAL | Status: DC
  Administered 2016-10-09 – 2016-10-11 (×3): 17.2 mg via ORAL
  Filled 2016-10-09 (×3): qty 2

## 2016-10-09 MED ORDER — OXYCODONE HCL 5 MG PO TABS
15.0000 mg | ORAL_TABLET | ORAL | Status: DC | PRN
  Administered 2016-10-09 – 2016-10-10 (×4): 15 mg via ORAL
  Filled 2016-10-09 (×4): qty 3

## 2016-10-09 MED ORDER — INSULIN ASPART 100 UNIT/ML ~~LOC~~ SOLN
10.0000 [IU] | Freq: Once | SUBCUTANEOUS | Status: AC
  Administered 2016-10-09: 10 [IU] via SUBCUTANEOUS

## 2016-10-09 MED ORDER — INSULIN ASPART 100 UNIT/ML ~~LOC~~ SOLN
8.0000 [IU] | Freq: Once | SUBCUTANEOUS | Status: AC
  Administered 2016-10-09: 8 [IU] via INTRAVENOUS
  Filled 2016-10-09: qty 1

## 2016-10-09 MED ORDER — ONDANSETRON HCL 4 MG/2ML IJ SOLN
4.0000 mg | Freq: Once | INTRAMUSCULAR | Status: AC
  Administered 2016-10-09: 4 mg via INTRAVENOUS
  Filled 2016-10-09: qty 2

## 2016-10-09 MED ORDER — BISACODYL 10 MG RE SUPP: 10.0000 mg | Freq: Every day | RECTAL | Status: DC | PRN

## 2016-10-09 MED ORDER — INSULIN DETEMIR 100 UNIT/ML ~~LOC~~ SOLN
40.0000 [IU] | Freq: Every day | SUBCUTANEOUS | Status: DC
  Administered 2016-10-09: 40 [IU] via SUBCUTANEOUS
  Filled 2016-10-09: qty 0.4

## 2016-10-09 MED ORDER — INSULIN ASPART 100 UNIT/ML ~~LOC~~ SOLN
10.0000 [IU] | Freq: Three times a day (TID) | SUBCUTANEOUS | Status: DC
  Administered 2016-10-10 – 2016-10-12 (×6): 10 [IU] via SUBCUTANEOUS

## 2016-10-09 MED ORDER — ENOXAPARIN SODIUM 40 MG/0.4ML ~~LOC~~ SOLN
40.0000 mg | SUBCUTANEOUS | Status: DC
  Administered 2016-10-09: 40 mg via SUBCUTANEOUS
  Filled 2016-10-09: qty 0.4

## 2016-10-09 MED ORDER — INSULIN ASPART 100 UNIT/ML ~~LOC~~ SOLN
0.0000 [IU] | Freq: Three times a day (TID) | SUBCUTANEOUS | Status: DC
  Administered 2016-10-10: 8 [IU] via SUBCUTANEOUS
  Administered 2016-10-10: 3 [IU] via SUBCUTANEOUS
  Administered 2016-10-10 – 2016-10-11 (×2): 5 [IU] via SUBCUTANEOUS
  Administered 2016-10-12: 3 [IU] via SUBCUTANEOUS

## 2016-10-09 MED ORDER — MELOXICAM 7.5 MG PO TABS
7.5000 mg | ORAL_TABLET | Freq: Every day | ORAL | Status: DC
  Administered 2016-10-10: 7.5 mg via ORAL
  Filled 2016-10-09 (×3): qty 1

## 2016-10-09 NOTE — Progress Notes (Signed)
Notified Erica Nguyen patient cbg [phone] at pre op and dr crews anesthesia said to make dr beane aware of patient increased blood sugar and patient needs better blood sugar control for surgery

## 2016-10-09 NOTE — H&P (Signed)
History and Physical    Erica Nguyen [id number] DOB: [birth year] DOA: 10/09/2016  Referring MD/NP/PA: Gilford Raid, Dr. Tonita Cong PCP: Alanson Aly, MD  Outpatient Specialists: Dr. Tonita Cong, Orthopedic surgery   Patient coming from: home  Chief Complaint: pain and numbness in the right leg  HPI: Erica Nguyen is a 45 y.o. female with medical history significant of hypertension, diabetes mellitus type 2, herniated disc status post L5-S1 decompression laminectomy in November 2016 who presents with back pain with radiation of pain and numbness down the right leg. The patient developed progressively worsening tingling and numbness of the right lower extremity a proximally 3 weeks ago. She has had 10 out of 10 pain radiating from her lower lumbar back down her buttock to the right leg that is exacerbated by walking or movement of the extremity. She has been seen as an outpatient by her primary care doctor and by orthopedic surgery.  Imaging demonstrated a large bulging L5-S1 disc with nerve root impingement.  She has been started on gabapentin titrated up to 900 mg 3 times a day and Percocet. She is also been taking NSAIDs without relief. She previously been working and ambulating without difficulty. Over the last few weeks she has only been able to move from her bed to the bathroom with extreme difficulty due to pain.  She received a steroid injection approximately one week ago which caused her blood sugars to go up slightly. She increased her insulin slightly but her blood sugars have continued to run in approximately the 200s.  She was seen today for routine labs in preparation for surgery on 10/20, however she was hyperglycemic and hypertensive and was referred to the emergency department for evaluation.  Patient reports that she did not have time to take her aspart insulin this morning before going to get her blood work done. She did take her Levemir last night before bed.  ED Course: Vital signs were  notable for an initial blood pressure of 187/148. She is tachycardic to the 120s while in pain in her heart rate dropped to the 90s after pain was addressed. She was afebrile.  Initial blood sugar on BMP was 447. She was given Solu-Medrol 125mg , aspart 8 units.  Her blood sugar has trended down to 330. She has received 2 doses of morphine 4 mg IV. The emergency department physician spoke with Dr. Tonita Cong who is requested hospitalization for blood pressure, blood sugar, and pain control in preparation for his surgery on Friday.  Review of Systems:  General:  Denies fevers, chills, weight loss or gain HEENT:  Denies changes to hearing and vision, rhinorrhea, sinus congestion, sore throat CV:  Denies chest pain and palpitations, lower extremity edema.  PULM:  Denies SOB, wheezing, cough.   GI:  Denies nausea, vomiting, positive constipation  GU:  Denies dysuria, frequency, urgency ENDO:  Denies polyuria, polydipsia.   HEME:  Denies hematemesis, blood in stools, melena, abnormal bruising or bleeding.  LYMPH:  Denies lymphadenopathy.   MSK:  Per history of present illness. No right lower extremity swelling DERM:  Denies skin rash or ulcer.   NEURO:  Denies slurred speech, confusion, facial droop.  PSYCH:  Denies anxiety and depression.    Past Medical History:  Diagnosis Date  . Abnormal Pap smear   . Diabetes mellitus    insulin-dependent type 2  . Family history of adverse reaction to anesthesia    sister has n/v  . Fibroid   . HNP (herniated nucleus pulposus)   .  Hypertension    chronic hypertension    Past Surgical History:  Procedure Laterality Date  . CARDIAC CATHETERIZATION  02/29/2008   Normal coronary arteries -- Normal LV (left ventricular) systolic function -- Mild to moderate elevation in left ventricular end-diastolic  pressure secondary to hypertension, diabetes and obesity  . CHOLECYSTECTOMY  1995  . DECOMPRESSIVE LUMBAR LAMINECTOMY LEVEL 1 Right 11/22/2015   Procedure:  DECOMPRESSIVE LUMBAR LAMINECTOMY L5-S1 ON RIGHT,FORAMINOTOMIES L5,S1 DISCECTOMY L5,S1;  Surgeon: Susa Day, MD;  Location: [facility];  Service: Orthopedics;  Laterality: Right;  . GYNECOLOGIC CRYOSURGERY  1998  . Hysteroscopy, D&C, Novasure ablation, removal of Intrauterine device  01/08/2008  . NOVASURE ABLATION  01/08/2008     reports that she has never smoked. She has never used smokeless tobacco. She reports that she does not drink alcohol or use drugs.  No Active Allergies  Family History  Problem Relation Age of Onset  . Heart disease Mother   . Diabetes Mother   . Hypertension Father   . Diabetes Father   . Kidney failure Father   . Stroke Father   . Hypertension Paternal Grandmother   . Diabetes Paternal Grandmother   . Stroke Paternal Grandmother   . Diabetes Maternal Grandmother   . Ovarian cancer Maternal Grandmother   . Diabetes Maternal Grandfather   . Diabetes Paternal Grandfather     Prior to Admission medications   Medication Sig Start Date End Date Taking? Authorizing Provider  gabapentin (NEURONTIN) 300 MG capsule Take 900 mg by mouth 3 (three) times daily. 09/24/16  Yes Historical Provider, MD  ibuprofen (ADVIL,MOTRIN) 200 MG tablet Take 800 mg by mouth every 6 (six) hours as needed (for pain.).   Yes Historical Provider, MD  insulin aspart (NOVOLOG) 100 UNIT/ML injection Inject 30 Units into the skin 2 (two) times daily.    Yes Historical Provider, MD  LEVEMIR FLEXTOUCH 100 UNIT/ML Pen Inject 40 Units into the skin every evening.  08/09/16  Yes Historical Provider, MD  lisinopril (PRINIVIL,ZESTRIL) 5 MG tablet Take 5 mg by mouth daily.   Yes Historical Provider, MD  oxyCODONE-acetaminophen (PERCOCET/ROXICET) 5-325 MG tablet Take 2 tablets by mouth 3 (three) times daily as needed. For pain. 10/01/16  Yes Historical Provider, MD    Physical Exam: Vitals:   10/09/16 1237 10/09/16 1500  BP: (!) 187/110 126/65  Pulse: (!) 127 100  Resp: 18   Temp: 98.2 F (36.8  C)   TempSrc: Oral   SpO2: 100% 100%      Constitutional: NAD, calm, comfortable Eyes: PERRL, lids and conjunctivae normal ENMT: Mucous membranes are moist. Posterior pharynx clear of any exudate or lesions.Normal dentition.  Neck: normal, supple, no masses, no thyromegaly Respiratory: clear to auscultation bilaterally, no wheezing, no crackles. Normal respiratory effort. No accessory muscle use.  Cardiovascular:  Mildly tachycardic, regular rhythm. No extremity edema. 2+ pedal pulses. No carotid bruits.  Abdomen: no tenderness, no masses palpated. No hepatosplenomegaly. Bowel sounds positive.  Mild abdominal fullness Musculoskeletal: no clubbing / cyanosis. No joint deformity upper and lower extremities. Good ROM, no contractures. Normal muscle tone.  No palpable cords or swelling of the right lower extremity Skin: no rashes, lesions, ulcers. No induration Neurologic: CN 2-12 grossly intact. Sensation diminished to light touch over the lateral aspect of her thigh circumferentially around her distal leg, and over the lateral aspect of her foot on the right side. Strength 5/5 in bilateral upper extremities and left lower extremity. Strength 4/5 in the right lower extremities secondary  to pain Psychiatric: Normal judgment and insight. Alert and oriented x 3. Normal mood.   Labs on Admission: I have personally reviewed following labs and imaging studies  CBC:  Recent Labs Lab 10/09/16 1130 10/09/16 1547  WBC 13.6* 13.8*  NEUTROABS  --  10.7*  HGB 14.2 13.5  HCT 40.9 39.7  MCV 85.7 86.1  PLT 371 [id number]   Basic Metabolic Panel:  Recent Labs Lab 10/09/16 1130 10/09/16 1547  NA 133* 135  K 4.3 4.3  CL 98* 101  CO2 23 22  GLUCOSE 447* 362*  BUN 13 13  CREATININE 0.74 0.67  CALCIUM 9.4 8.9   GFR: Estimated Creatinine Clearance: 110.2 mL/min (by C-G formula based on SCr of 0.67 mg/dL). Liver Function Tests: No results for input(s): AST, ALT, ALKPHOS, BILITOT, PROT, ALBUMIN in  the last 168 hours. No results for input(s): LIPASE, AMYLASE in the last 168 hours. No results for input(s): AMMONIA in the last 168 hours. Coagulation Profile: No results for input(s): INR, PROTIME in the last 168 hours. Cardiac Enzymes: No results for input(s): CKTOTAL, CKMB, CKMBINDEX, TROPONINI in the last 168 hours. BNP (last 3 results) No results for input(s): PROBNP in the last 8760 hours. HbA1C: No results for input(s): HGBA1C in the last 72 hours. CBG:  Recent Labs Lab 10/09/16 1049 10/09/16 1353 10/09/16 1652  GLUCAP 419* 392* 330*   Lipid Profile: No results for input(s): CHOL, HDL, LDLCALC, TRIG, CHOLHDL, LDLDIRECT in the last 72 hours. Thyroid Function Tests: No results for input(s): TSH, T4TOTAL, FREET4, T3FREE, THYROIDAB in the last 72 hours. Anemia Panel: No results for input(s): VITAMINB12, FOLATE, FERRITIN, TIBC, IRON, RETICCTPCT in the last 72 hours. Urine analysis:    Component Value Date/Time   COLORURINE YELLOW 06/15/2016 1741   APPEARANCEUR CLEAR 06/15/2016 1741   LABSPEC 1.010 06/15/2016 1741   PHURINE 5.5 06/15/2016 1741   GLUCOSEU >1000 (A) 06/15/2016 1741   HGBUR TRACE (A) 06/15/2016 1741   BILIRUBINUR NEGATIVE 06/15/2016 1741   KETONESUR NEGATIVE 06/15/2016 1741   PROTEINUR NEGATIVE 06/15/2016 1741   UROBILINOGEN 0.2 02/12/2014 1608   NITRITE NEGATIVE 06/15/2016 1741   LEUKOCYTESUR NEGATIVE 06/15/2016 1741   Sepsis Labs: @LABRCNTIP (procalcitonin:4,lacticidven:4) ) Recent Results (from the past 240 hour(s))  Surgical pcr screen     Status: None   Collection Time: 10/09/16 11:12 AM  Result Value Ref Range Status   MRSA, PCR NEGATIVE NEGATIVE Final   Staphylococcus aureus NEGATIVE NEGATIVE Final    Comment:        The Xpert SA Assay (FDA approved for NASAL specimens in patients over 29 years of age), is one component of a comprehensive surveillance program.  Test performance has been validated by [facility] for patients  greater than or equal to 6 year old. It is not intended to diagnose infection nor to guide or monitor treatment.      Radiological Exams on Admission: Dg Lumbar Spine 2-3 Views  Result Date: 10/09/2016 CLINICAL DATA:  Preop evaluation for upcoming lumbar surgery EXAM: LUMBAR SPINE - 2-3 VIEW COMPARISON:  11/22/2015 FINDINGS: Five lumbar type vertebral bodies are well visualized. Mild retrolisthesis of L5 on S1 is noted. This appears slightly greater than that seen on prior exams. No soft tissue abnormality is noted. Lumbar segments were numbered for surgical evaluation. The nomenclature is similar to that used on prior exams. IMPRESSION: Mild retrolisthesis of L5 on S1. Electronically Signed   By: Inez Catalina M.D.   On: 10/09/2016 15:04    EKG: Independently  reviewed. Sinus tachycardia  Assessment/Plan Active Problems:   Controlled type 2 diabetes mellitus with hyperglycemia (HCC)   Essential hypertension   Hyperglycemia   Lumbar herniated disc   Myelopathy (HCC)   Herniated disk with impingement of the L5-S1 nerve roots with significant pain, tingling, and numbness which have been limiting her mobility.   -  Start mobic -  Increase oxycodone to 15 mg -  Start MiraLAX, senna, and bisacodyl -  Continue morphine for breakthrough pain -  Stop steroids -  Weightbearing as tolerated pending surgery on Friday -  Orthopedics consultation, case has been discussed by myself with Dr. Tonita Cong  Diabetes mellitus type 2 with hyperglycemia due to cortisone injection last week, pain, and noncompliance with her morning aspirin on the date of her routine blood work. Her hyperglycemia will likely be exacerbated by the Solu-Medrol she received in the emergency department. -  Resume Levemir 40 units -  Start aspart 10 units standing with meals -  Moderate dose sliding scale insulin -  At bedtime insulin  Hypertension, resolved with pain control in the emergency department -  Continue  lisinopril  DVT prophylaxis: Lovenox  Code Status: Full code Family Communication: Patient, her sister, and her son were at bedside, questions answered  Disposition Plan: Home pending surgery on Friday, anticipate that she will need several days post surgery for recuperation  Consults called: Orthopedics, Dr. Tonita Cong  Admission status: Observation for hyperglycemia and pain control, but likely qualifies for inpatient given surgery on Friday  Janece Canterbury MD [facility] Pager [phone]  If 7PM-7AM, please contact night-coverage [email or web address] Password [email or web address]  10/09/2016, 6:08 PM

## 2016-10-09 NOTE — ED Notes (Signed)
Unable to get labs ?

## 2016-10-09 NOTE — ED Triage Notes (Addendum)
Pt having surgery on Friday for ruptured disc.  Here today for preop.  Told them she is not sleeping and her pain meds aren't working.  Told to come here for pain control.  Pt taking percocet at home. States not working.  Took this morning at 9 am.  After explaining pt to go to lobby, pt requesting triage pain meds.  Medicated and advised cannot drive.

## 2016-10-09 NOTE — ED Provider Notes (Signed)
Dictation #1 [id number]  [id number]  [facility] DEPT Provider Note   CSN: [id number] Arrival date & time: 10/09/16  1223     History   Chief Complaint Chief Complaint  Patient presents with  . Leg Pain    HPI Erica Nguyen is a 45 y.o. female.  Pt presents to the ED today for pain and blood sugar control.  The pt is scheduled for a decompression of a herniated disc in 2 days.  She was at the hospital today for preop labs.  The pt said that she has not been sleeping and her pain medications have not been working.  She has been unable to walk due to the pain.  The pt has been on outpatient percocet.  The pt's blood sugar was elevated today as well.  No recent steroids.  Dr. Tonita Cong ([facility]) called me and asked if I could call the hospitalists to admit pt to control her blood sugar so that she could get the surgery.       Past Medical History:  Diagnosis Date  . Abnormal Pap smear   . Diabetes mellitus    insulin-dependent type 2  . Family history of adverse reaction to anesthesia    sister has n/v  . Fibroid   . HNP (herniated nucleus pulposus)   . Hypertension    chronic hypertension    Patient Active Problem List   Diagnosis Date Noted  . Hyperglycemia 10/09/2016  . Spinal stenosis of lumbar region 11/22/2015  . Fibroids, intramural 06/27/2014  . Chest pain 01/06/2012  . Dyspnea 01/06/2012  . DIABETES MELLITUS, TYPE II 09/25/2007  . HYPERTENSION 09/25/2007    Past Surgical History:  Procedure Laterality Date  . CARDIAC CATHETERIZATION  02/29/2008   Normal coronary arteries -- Normal LV (left ventricular) systolic function -- Mild to moderate elevation in left ventricular end-diastolic  pressure secondary to hypertension, diabetes and obesity  . CHOLECYSTECTOMY  1995  . DECOMPRESSIVE LUMBAR LAMINECTOMY LEVEL 1 Right 11/22/2015   Procedure: DECOMPRESSIVE LUMBAR LAMINECTOMY L5-S1 ON RIGHT,FORAMINOTOMIES L5,S1 DISCECTOMY L5,S1;  Surgeon: Susa Day, MD;  Location: [facility];  Service: Orthopedics;  Laterality: Right;  . GYNECOLOGIC CRYOSURGERY  1998  . Hysteroscopy, D&C, Novasure ablation, removal of Intrauterine device  01/08/2008  . NOVASURE ABLATION  01/08/2008    OB History    Gravida Para Term Preterm AB Living   3 1 1   2 1    SAB TAB Ectopic Multiple Live Births   2       1       Home Medications    Prior to Admission medications   Medication Sig Start Date End Date Taking? Authorizing Provider  gabapentin (NEURONTIN) 300 MG capsule Take 900 mg by mouth 3 (three) times daily. 09/24/16  Yes Historical Provider, MD  ibuprofen (ADVIL,MOTRIN) 200 MG tablet Take 800 mg by mouth every 6 (six) hours as needed (for pain.).   Yes Historical Provider, MD  insulin aspart (NOVOLOG) 100 UNIT/ML injection Inject 20 Units into the skin 2 (two) times daily.    Yes Historical Provider, MD  LEVEMIR FLEXTOUCH 100 UNIT/ML Pen Inject 30 Units into the skin every evening. 08/09/16  Yes Historical Provider, MD  lisinopril (PRINIVIL,ZESTRIL) 5 MG tablet Take 5 mg by mouth daily.   Yes Historical Provider, MD  oxyCODONE-acetaminophen (PERCOCET/ROXICET) 5-325 MG tablet Take 1 tablet by mouth 3 (three) times daily as needed. For pain. 10/01/16  Yes Historical Provider, MD  metroNIDAZOLE (FLAGYL) 500 MG tablet Take  1 tablet (500 mg total) by mouth 3 (three) times daily. Patient not taking: Reported on 10/07/2016 06/15/16   Keitha Butte, CNM    Family History Family History  Problem Relation Age of Onset  . Heart disease Mother   . Diabetes Mother   . Hypertension Father   . Diabetes Father   . Kidney failure Father   . Stroke Father   . Hypertension Paternal Grandmother   . Diabetes Paternal Grandmother   . Stroke Paternal Grandmother   . Diabetes Maternal Grandmother   . Ovarian cancer Maternal Grandmother   . Diabetes Maternal Grandfather   . Diabetes Paternal Grandfather     Social History Social History  Substance Use Topics  .  Smoking status: Never Smoker  . Smokeless tobacco: Never Used  . Alcohol use No     Allergies   Amoxicillin   Review of Systems Review of Systems  Musculoskeletal: Positive for back pain.  Neurological: Positive for numbness.  All other systems reviewed and are negative.    Physical Exam Updated Vital Signs BP 126/65   Pulse 100   Temp 98.2 F (36.8 C) (Oral)   Resp 18   SpO2 100%   Physical Exam  Constitutional: She is oriented to person, place, and time. She appears well-developed and well-nourished. She appears distressed.  HENT:  Head: Normocephalic and atraumatic.  Right Ear: External ear normal.  Left Ear: External ear normal.  Nose: Nose normal.  Mouth/Throat: Oropharynx is clear and moist.  Eyes: Conjunctivae and EOM are normal. Pupils are equal, round, and reactive to light.  Neck: Normal range of motion. Neck supple.  Cardiovascular: Regular rhythm, normal heart sounds and intact distal pulses.  Tachycardia present.   Pulmonary/Chest: Effort normal and breath sounds normal.  Abdominal: Soft. Bowel sounds are normal.  Musculoskeletal: Normal range of motion.  Neurological: She is alert and oriented to person, place, and time.  Skin: Skin is warm.  Psychiatric: She has a normal mood and affect. Her behavior is normal. Judgment and thought content normal.  Nursing note and vitals reviewed.    ED Treatments / Results  Labs (all labs ordered are listed, but only abnormal results are displayed) Labs Reviewed  BASIC METABOLIC PANEL - Abnormal; Notable for the following:       Result Value   Glucose, Bld 362 (*)    All other components within normal limits  CBC WITH DIFFERENTIAL/PLATELET - Abnormal; Notable for the following:    WBC 13.8 (*)    Neutro Abs 10.7 (*)    All other components within normal limits  CBG MONITORING, ED - Abnormal; Notable for the following:    Glucose-Capillary 392 (*)    All other components within normal limits  CBG  MONITORING, ED - Abnormal; Notable for the following:    Glucose-Capillary 330 (*)    All other components within normal limits  BLOOD GAS, VENOUS  URINALYSIS, ROUTINE W REFLEX MICROSCOPIC (NOT AT [facility])  CBG MONITORING, ED  CBG MONITORING, ED    EKG  EKG Interpretation None       Radiology Dg Lumbar Spine 2-3 Views  Result Date: 10/09/2016 CLINICAL DATA:  Preop evaluation for upcoming lumbar surgery EXAM: LUMBAR SPINE - 2-3 VIEW COMPARISON:  11/22/2015 FINDINGS: Five lumbar type vertebral bodies are well visualized. Mild retrolisthesis of L5 on S1 is noted. This appears slightly greater than that seen on prior exams. No soft tissue abnormality is noted. Lumbar segments were numbered for surgical evaluation. The  nomenclature is similar to that used on prior exams. IMPRESSION: Mild retrolisthesis of L5 on S1. Electronically Signed   By: Inez Catalina M.D.   On: 10/09/2016 15:04    Procedures Procedures (including critical care time)  Medications Ordered in ED Medications  oxyCODONE-acetaminophen (PERCOCET/ROXICET) 5-325 MG per tablet 1 tablet (1 tablet Oral Given 10/09/16 1303)  sodium chloride 0.9 % bolus 1,000 mL (1,000 mLs Intravenous New Bag/Given 10/09/16 1518)  morphine 4 MG/ML injection 4 mg (4 mg Intravenous Given 10/09/16 1518)  ondansetron (ZOFRAN) injection 4 mg (4 mg Intravenous Given 10/09/16 1518)  methylPREDNISolone sodium succinate (SOLU-MEDROL) 125 mg/2 mL injection 125 mg (125 mg Intravenous Given 10/09/16 1518)  insulin aspart (novoLOG) injection 8 Units (8 Units Intravenous Given 10/09/16 1650)     Initial Impression / Assessment and Plan / ED Course  I have reviewed the triage vital signs and the nursing notes.  Pertinent labs & imaging results that were available during my care of the patient were reviewed by me and considered in my medical decision making (see chart for details).  Clinical Course    Pt d/w Dr. Sheran Fava ([facility]) who will admit pt for  observation to try to control her BS and her pain.    Final Clinical Impressions(s) / ED Diagnoses   Final diagnoses:  Sciatica of right side  Uncontrolled type 2 diabetes mellitus without complication, with long-term current use of insulin (HCC)  Intractable pain  Ambulatory dysfunction    New Prescriptions New Prescriptions   No medications on file     Isla Pence, MD 10/09/16 1655

## 2016-10-09 NOTE — ED Notes (Signed)
Pt c/o dry mouth and feeling "funny".  Pt is a diabetic. Will check cbg.

## 2016-10-10 DIAGNOSIS — E1165 Type 2 diabetes mellitus with hyperglycemia: Secondary | ICD-10-CM

## 2016-10-10 DIAGNOSIS — Z794 Long term (current) use of insulin: Secondary | ICD-10-CM

## 2016-10-10 DIAGNOSIS — IMO0001 Reserved for inherently not codable concepts without codable children: Secondary | ICD-10-CM

## 2016-10-10 DIAGNOSIS — I10 Essential (primary) hypertension: Secondary | ICD-10-CM | POA: Diagnosis not present

## 2016-10-10 DIAGNOSIS — M5431 Sciatica, right side: Secondary | ICD-10-CM | POA: Diagnosis not present

## 2016-10-10 LAB — BASIC METABOLIC PANEL
Anion gap: 8 (ref 5–15)
BUN: 18 mg/dL (ref 6–20)
CHLORIDE: 104 mmol/L (ref 101–111)
CO2: 21 mmol/L — AB (ref 22–32)
CREATININE: 0.6 mg/dL (ref 0.44–1.00)
Calcium: 9.1 mg/dL (ref 8.9–10.3)
GFR calc non Af Amer: >60 mL/min (ref >60)
GLUCOSE: 282 mg/dL — AB (ref 65–99)
Potassium: 4.3 mmol/L (ref 3.5–5.1)
Sodium: 133 mmol/L — ABNORMAL LOW (ref 135–145)

## 2016-10-10 LAB — URINALYSIS, ROUTINE W REFLEX MICROSCOPIC
Bilirubin Urine: NEGATIVE
Bilirubin Urine: NEGATIVE
Glucose, UA: >1000 mg/dL — AB
Glucose, UA: >1000 mg/dL — AB
KETONES UR: 15 mg/dL — AB
Ketones, ur: >80 mg/dL — AB
LEUKOCYTES UA: NEGATIVE
LEUKOCYTES UA: NEGATIVE
NITRITE: NEGATIVE
Nitrite: NEGATIVE
PROTEIN: NEGATIVE mg/dL
PROTEIN: NEGATIVE mg/dL
Specific Gravity, Urine: 1.039 — ABNORMAL HIGH (ref 1.005–1.030)
Specific Gravity, Urine: 1.042 — ABNORMAL HIGH (ref 1.005–1.030)
pH: 5.5 (ref 5.0–8.0)
pH: 6 (ref 5.0–8.0)

## 2016-10-10 LAB — HEMOGLOBIN A1C
HEMOGLOBIN A1C: 10.7 % — AB (ref 4.8–5.6)
HEMOGLOBIN A1C: 10.6 % — AB (ref 4.8–5.6)
Mean Plasma Glucose: 260 mg/dL
Mean Plasma Glucose: 258 mg/dL

## 2016-10-10 LAB — URINE MICROSCOPIC-ADD ON

## 2016-10-10 LAB — MAGNESIUM: Magnesium: 1.8 mg/dL (ref 1.7–2.4)

## 2016-10-10 LAB — GLUCOSE, CAPILLARY
GLUCOSE-CAPILLARY: 233 mg/dL — AB (ref 65–99)
Glucose-Capillary: 258 mg/dL — ABNORMAL HIGH (ref 65–99)
Glucose-Capillary: 188 mg/dL — ABNORMAL HIGH (ref 65–99)

## 2016-10-10 MED ORDER — DOCUSATE SODIUM 100 MG PO CAPS
100.0000 mg | ORAL_CAPSULE | Freq: Two times a day (BID) | ORAL | Status: DC
  Administered 2016-10-10 (×2): 100 mg via ORAL
  Filled 2016-10-10 (×2): qty 1

## 2016-10-10 MED ORDER — PHENAZOPYRIDINE HCL 200 MG PO TABS
200.0000 mg | ORAL_TABLET | Freq: Once | ORAL | Status: AC
Start: 2016-10-11 — End: 2016-10-11
  Administered 2016-10-11: 200 mg via ORAL
  Filled 2016-10-10: qty 1

## 2016-10-10 MED ORDER — SODIUM CHLORIDE 0.9 % IV BOLUS (SEPSIS)
500.0000 mL | Freq: Once | INTRAVENOUS | Status: AC
  Administered 2016-10-11: 500 mL via INTRAVENOUS

## 2016-10-10 MED ORDER — INSULIN DETEMIR 100 UNIT/ML ~~LOC~~ SOLN
44.0000 [IU] | Freq: Every day | SUBCUTANEOUS | Status: DC
Start: 2016-10-10 — End: 2016-10-12
  Administered 2016-10-10 – 2016-10-11 (×2): 44 [IU] via SUBCUTANEOUS
  Filled 2016-10-10 (×2): qty 0.44

## 2016-10-10 MED ORDER — OXYCODONE HCL 5 MG PO TABS
15.0000 mg | ORAL_TABLET | ORAL | Status: DC | PRN
  Administered 2016-10-10 – 2016-10-12 (×7): 15 mg via ORAL
  Filled 2016-10-10 (×8): qty 3

## 2016-10-10 NOTE — Progress Notes (Signed)
10/09/16 2130 Dr. schorr paged reg cbg [phone]. Order received for 10 units novolog insulin. 40 units Levemir given as well. Will continue to monitor patient

## 2016-10-10 NOTE — Progress Notes (Signed)
Inpatient Diabetes Program Recommendations  AACE/ADA: New Consensus Statement on Inpatient Glycemic Control (2015)  Target Ranges:  Prepandial:   less than 140 mg/dL      Peak postprandial:   less than 180 mg/dL (1-2 hours)      Critically ill patients:  140 - 180 mg/dL   Results for Erica Nguyen, Erica Nguyen (MRN [id number]) as of 10/10/2016 10:15  Ref. Range 10/09/2016 10:49 10/09/2016 13:53 10/09/2016 16:52 10/09/2016 18:23 10/09/2016 21:19  Glucose-Capillary Latest Ref Range: 65 - 99 mg/dL 419 (H) 392 (H) 330 (H) 355 (H) 420 (H)   Results for Erica Nguyen, Erica Nguyen (MRN [id number]) as of 10/10/2016 10:15  Ref. Range 10/10/2016 05:04 10/10/2016 07:30  Glucose-Capillary Latest Ref Range: 65 - 99 mg/dL 258 (H) 233 (H)   Results for Erica Nguyen, Erica Nguyen (MRN [id number]) as of 10/10/2016 10:15  Ref. Range 10/09/2016 15:47  Hemoglobin A1C Latest Ref Range: 4.8 - 5.6 % 10.6 (H)    Admit with: Back Surgery  History: DM  Home DM Meds: Levemir 40 units QPM       Novolog 30 units BID  Current Insulin Orders: Levemir 44 units QHS        Novolog Moderate Correction Scale/ SSI (0-15 units) TID AC + HS      Novolog 10 units TIDWC     -Spoke with patient about her current A1c of 10.6%.  Explained what an A1c is and what it measures.  Reminded patient that her goal A1c is 7% or less per ADA standards to prevent both acute and long-term complications.  Explained to patient the extreme importance of good glucose control at home.  Encouraged patient to check her CBGs at least tid at home and to record all CBGs in a logbook for her Endocrinologist to review.  -Patient told me she has been working with her Endocrinologist (Dr. Meredith Pel in [facility]) to get her blood sugars under better control.  Has been titrating insulin upward to try to bring CBGs down.  Has follow-up appt with Dr. Meredith Pel in November.  Patient told me she had a Cortisone injection a week prior to hospitalization for her back and has been  having a lot of pain which she attributes to her elevated glucose levels.  Patient was very concerned about her CBGs and stated to me she is hopeful her CBGs come down throughout the day so she can have surgery tomorrow.  -Discussed with patient that we have restarted her Levemir and Novolog.  Explained that we are using Novolog SSI and Novolog Meal Coverage in addition to Levemir to stabilize her CBGs.  Patient in agreement with this plan.      --Will follow patient during hospitalization--  Wyn Quaker RN, MSN, CDE Diabetes Coordinator Inpatient Glycemic Control Team Team Pager: [phone] (8a-5p)

## 2016-10-10 NOTE — Progress Notes (Signed)
Nutrition Brief Note  Patient identified on the Malnutrition Screening Tool (MST) Report  Patient with stable weight over the past year (230-238 lb). Pt consuming 100% of meals.  Wt Readings from Last 15 Encounters:  10/09/16 236 lb (107 kg)  10/09/16 236 lb (107 kg)  06/15/16 230 lb (104.3 kg)  11/22/15 238 lb 8 oz (108.2 kg)  11/15/15 238 lb 8 oz (108.2 kg)  08/18/14 249 lb (112.9 kg)  06/27/14 255 lb 8.2 oz (115.9 kg)  04/20/14 244 lb (110.7 kg)  03/15/14 247 lb (112 kg)  10/06/13 240 lb (108.9 kg)  10/01/13 240 lb (108.9 kg)  09/17/13 241 lb (109.3 kg)  02/06/13 222 lb (100.7 kg)  01/06/12 239 lb (108.4 kg)  09/23/06 (!) 254 lb (115.2 kg)    Body mass index is 39.27 kg/m. Patient meets criteria for obesity based on current BMI.   Current diet order is CHO modified, patient is consuming approximately 100% of meals at this time. Labs and medications reviewed.   No nutrition interventions warranted at this time. If nutrition issues arise, please consult RD.   Clayton Bibles, MS, RD, LDN Pager: [phone] After Hours Pager: [phone]

## 2016-10-10 NOTE — Consult Note (Signed)
Reason for Consult: Right leg pain Referring Physician: EDP  Erica Nguyen is an 45 y.o. female.  HPI: Recurrent HNP L5S1 pending surgery with uncontrolled diabetes Admitted for medical management.  Past Medical History:  Diagnosis Date  . Abnormal Pap smear   . Diabetes mellitus    insulin-dependent type 2  . Family history of adverse reaction to anesthesia    sister has n/v  . Fibroid   . HNP (herniated nucleus pulposus)   . Hypertension    chronic hypertension    Past Surgical History:  Procedure Laterality Date  . CARDIAC CATHETERIZATION  02/29/2008   Normal coronary arteries -- Normal LV (left ventricular) systolic function -- Mild to moderate elevation in left ventricular end-diastolic  pressure secondary to hypertension, diabetes and obesity  . CHOLECYSTECTOMY  1995  . DECOMPRESSIVE LUMBAR LAMINECTOMY LEVEL 1 Right 11/22/2015   Procedure: DECOMPRESSIVE LUMBAR LAMINECTOMY L5-S1 ON RIGHT,FORAMINOTOMIES L5,S1 DISCECTOMY L5,S1;  Surgeon: Susa Day, MD;  Location: [facility];  Service: Orthopedics;  Laterality: Right;  . GYNECOLOGIC CRYOSURGERY  1998  . Hysteroscopy, D&C, Novasure ablation, removal of Intrauterine device  01/08/2008  . [place] ABLATION  01/08/2008    Family History  Problem Relation Age of Onset  . Heart disease Mother   . Diabetes Mother   . Hypertension Father   . Diabetes Father   . Kidney failure Father   . Stroke Father   . Hypertension Paternal Grandmother   . Diabetes Paternal Grandmother   . Stroke Paternal Grandmother   . Diabetes Maternal Grandmother   . Ovarian cancer Maternal Grandmother   . Diabetes Maternal Grandfather   . Diabetes Paternal Grandfather     Social History:  reports that Erica Nguyen has never smoked. Erica Nguyen has never used smokeless tobacco. Erica Nguyen reports that Erica Nguyen does not drink alcohol or use drugs.  Allergies: No Active Allergies  Medications: I have reviewed the patient's current medications.  Results for orders placed or  performed during the hospital encounter of 10/09/16 (from the past 48 hour(s))  CBG monitoring, ED     Status: Abnormal   Collection Time: 10/09/16  1:53 PM  Result Value Ref Range   Glucose-Capillary 392 (H) 65 - 99 mg/dL  Basic metabolic panel     Status: Abnormal   Collection Time: 10/09/16  3:47 PM  Result Value Ref Range   Sodium 135 135 - 145 mmol/L   Potassium 4.3 3.5 - 5.1 mmol/L   Chloride 101 101 - 111 mmol/L   CO2 22 22 - 32 mmol/L   Glucose, Bld 362 (H) 65 - 99 mg/dL   BUN 13 6 - 20 mg/dL   Creatinine, Ser 0.67 0.44 - 1.00 mg/dL   Calcium 8.9 8.9 - 10.3 mg/dL   GFR calc non Af Amer >60 >60 mL/min   GFR calc Af Amer >60 >60 mL/min    Comment: (NOTE) The eGFR has been calculated using the CKD EPI equation. This calculation has not been validated in all clinical situations. eGFR's persistently <60 mL/min signify possible Chronic Kidney Disease.    Anion gap 12 5 - 15  CBC with Differential     Status: Abnormal   Collection Time: 10/09/16  3:47 PM  Result Value Ref Range   WBC 13.8 (H) 4.0 - 10.5 K/uL   RBC 4.61 3.87 - 5.11 MIL/uL   Hemoglobin 13.5 12.0 - 15.0 g/dL   HCT 39.7 36.0 - 46.0 %   MCV 86.1 78.0 - 100.0 fL   MCH 29.3 26.0 -  34.0 pg   MCHC 34.0 30.0 - 36.0 g/dL   RDW 13.2 11.5 - 15.5 %   Platelets 316 150 - 400 K/uL   Neutrophils Relative % 77 %   Neutro Abs 10.7 (H) 1.7 - 7.7 K/uL   Lymphocytes Relative 17 %   Lymphs Abs 2.3 0.7 - 4.0 K/uL   Monocytes Relative 6 %   Monocytes Absolute 0.8 0.1 - 1.0 K/uL   Eosinophils Relative 0 %   Eosinophils Absolute 0.0 0.0 - 0.7 K/uL   Basophils Relative 0 %   Basophils Absolute 0.0 0.0 - 0.1 K/uL  Hemoglobin A1c     Status: Abnormal   Collection Time: 10/09/16  3:47 PM  Result Value Ref Range   Hgb A1c MFr Bld 10.6 (H) 4.8 - 5.6 %    Comment: (NOTE)         Pre-diabetes: 5.7 - 6.4         Diabetes: >6.4         Glycemic control for adults with diabetes: <7.0    Mean Plasma Glucose 258 mg/dL    Comment:  (NOTE) Performed At: [facility] [place] [address] [place], [place] [id number] [place] William F MD Ph:[phone]   Blood gas, venous     Status: None   Collection Time: 10/09/16  3:50 PM  Result Value Ref Range   pH, Ven 7.357 7.250 - 7.430   pCO2, Ven 44.5 44.0 - 60.0 mmHg   pO2, Ven 38.5 32.0 - 45.0 mmHg   Bicarbonate 24.3 20.0 - 28.0 mmol/L   Acid-base deficit 0.9 0.0 - 2.0 mmol/L   O2 Saturation 64.9 %   Patient temperature 98.6    Collection site VEIN    Drawn by DRAWN BY RN    Sample type VENOUS   POC CBG, ED     Status: Abnormal   Collection Time: 10/09/16  4:52 PM  Result Value Ref Range   Glucose-Capillary 330 (H) 65 - 99 mg/dL  POC CBG, ED     Status: Abnormal   Collection Time: 10/09/16  6:23 PM  Result Value Ref Range   Glucose-Capillary 355 (H) 65 - 99 mg/dL  Glucose, capillary     Status: Abnormal   Collection Time: 10/09/16  9:19 PM  Result Value Ref Range   Glucose-Capillary 420 (H) 65 - 99 mg/dL  Urinalysis, Routine w reflex microscopic     Status: Abnormal   Collection Time: 10/10/16 12:47 AM  Result Value Ref Range   Color, Urine YELLOW YELLOW   APPearance CLEAR CLEAR   Specific Gravity, Urine 1.039 (H) 1.005 - 1.030   pH 5.5 5.0 - 8.0   Glucose, UA >1000 (A) NEGATIVE mg/dL   Hgb urine dipstick LARGE (A) NEGATIVE   Bilirubin Urine NEGATIVE NEGATIVE   Ketones, ur >80 (A) NEGATIVE mg/dL   Protein, ur NEGATIVE NEGATIVE mg/dL   Nitrite NEGATIVE NEGATIVE   Leukocytes, UA NEGATIVE NEGATIVE  Urine microscopic-add on     Status: Abnormal   Collection Time: 10/10/16 12:47 AM  Result Value Ref Range   Squamous Epithelial / LPF 0-5 (A) NONE SEEN   WBC, UA 0-5 0 - 5 WBC/hpf   RBC / HPF TOO NUMEROUS TO COUNT 0 - 5 RBC/hpf   Bacteria, UA FEW (A) NONE SEEN  Basic metabolic panel     Status: Abnormal   Collection Time: 10/10/16  4:40 AM  Result Value Ref Range   Sodium 133 (L) 135 - 145 mmol/L   Potassium   4.3 3.5 - 5.1 mmol/L   Chloride 104 101 - 111  mmol/L   CO2 21 (L) 22 - 32 mmol/L   Glucose, Bld 282 (H) 65 - 99 mg/dL   BUN 18 6 - 20 mg/dL   Creatinine, Ser 0.60 0.44 - 1.00 mg/dL   Calcium 9.1 8.9 - 10.3 mg/dL   GFR calc non Af Amer >60 >60 mL/min   GFR calc Af Amer >60 >60 mL/min    Comment: (NOTE) The eGFR has been calculated using the CKD EPI equation. This calculation has not been validated in all clinical situations. eGFR's persistently <60 mL/min signify possible Chronic Kidney Disease.    Anion gap 8 5 - 15    Dg Lumbar Spine 2-3 Views  Result Date: 10/09/2016 CLINICAL DATA:  Preop evaluation for upcoming lumbar surgery EXAM: LUMBAR SPINE - 2-3 VIEW COMPARISON:  11/22/2015 FINDINGS: Five lumbar type vertebral bodies are well visualized. Mild retrolisthesis of L5 on S1 is noted. This appears slightly greater than that seen on prior exams. No soft tissue abnormality is noted. Lumbar segments were numbered for surgical evaluation. The nomenclature is similar to that used on prior exams. IMPRESSION: Mild retrolisthesis of L5 on S1. Electronically Signed   By: Mark  Lukens M.D.   On: 10/09/2016 15:04    Review of Systems  Constitutional: Positive for malaise/fatigue.  HENT: Negative.   Eyes: Negative.   Respiratory: Negative.   Cardiovascular: Negative.   Gastrointestinal: Negative.   Genitourinary: Negative.   Musculoskeletal: Positive for back pain.  Skin: Negative.   Neurological: Positive for focal weakness and weakness.  Endo/Heme/Allergies: Negative.   Psychiatric/Behavioral: Negative.    Blood pressure (!) 167/87, pulse (!) 110, temperature 98.2 F (36.8 C), temperature source Oral, resp. rate 16, height 5' 5" (1.651 m), weight 107 kg (236 lb), SpO2 98 %. Physical Exam  Constitutional: Erica Nguyen is oriented to person, place, and time. Erica Nguyen appears well-developed.  HENT:  Head: Normocephalic.  Eyes: Pupils are equal, round, and reactive to light.  Neck: Normal range of motion.  Cardiovascular: Normal rate.    Respiratory: Effort normal.  GI: Soft.  Musculoskeletal:  SLR positive right. EHL, PF 4/5 right. Decreased sensation S1 dermatome. No DVT.   Neurological: Erica Nguyen is alert and oriented to person, place, and time. Erica Nguyen displays abnormal reflex.  Skin: Skin is warm and dry.  Psychiatric: Erica Nguyen has a normal mood and affect.    Assessment/Plan:  Right L5S1 radiculopathy due to HNP L5S1. Improving diabetic control. Plan Decompression tomorrow after glucose normalization. Hold lovenox for today. PAS and OOB. Appreciate Dr. Mckenzie's help.  Rosine Solecki C  [phone] 10/10/2016, 7:16 AM     

## 2016-10-10 NOTE — Progress Notes (Addendum)
[facility] PROGRESS NOTE  Erica Nguyen [id number] DOB: [birth year] DOA: 10/09/2016  PCP: Alanson Aly, MD  Brief History/Interval Summary: 45 year old female with a past medical history of hypertension, diabetes mellitus type 2, herniated disc, status post L5-S1 decompression laminectomy in November 2016, presented with back pain with radiation of the pain down her right leg. Plan is for another surgical intervention. Preoperative labs were done which showed significant hyperglycemia. Patient was sent over to the emergency department. Since surgery was planned for Friday, it was felt that she will need to be optimized and so she was hospitalized for further management.  Reason for Visit: Back pain and hyperglycemia  Consultants: orthopedics  Procedures: None yet  Antibiotics: None  Subjective/Interval History: Patient continues to have back pain radiating down to her right leg. Otherwise, she denies any nausea, vomiting. No abdominal pain. No diarrhea.  ROS: No chest pain or shortness of breath.  Objective:  Vital Signs  Vitals:   10/09/16 2120 10/09/16 2257 10/10/16 0510 10/10/16 0700  BP: (!) 190/95 (!) 155/84 (!) 167/87 (!) 159/98  Pulse: (!) 113  (!) 110 (!) 105  Resp: 16  16   Temp: 97.9 F (36.6 C)  98.2 F (36.8 C) 98.6 F (37 C)  TempSrc: Oral  Oral Oral  SpO2: 100%  98% 100%  Weight:      Height:        Intake/Output Summary (Last 24 hours) at 10/10/16 1140 Last data filed at 10/10/16 [id number]  Gross per 24 hour  Intake             1120 ml  Output              400 ml  Net              720 ml   Filed Weights   10/09/16 1832  Weight: 107 kg (236 lb)    General appearance: alert, cooperative, appears stated age and no distress Resp: clear to auscultation bilaterally Cardio: regular rate and rhythm, S1, S2 normal, no murmur, click, rub or gallop GI: soft, non-tender; bowel sounds normal; no masses,  no organomegaly Extremities: extremities  normal, atraumatic, no cyanosis or edema Neurologic: Awake and alert. Oriented 3. Cranial no strokes 12 intact. Able to lift both legs off the bed, although she is somewhat weaker on the right side. This is most likely secondary to pain issues.  Lab Results:  Data Reviewed: I have personally reviewed following labs and imaging studies  CBC:  Recent Labs Lab 10/09/16 1130 10/09/16 1547  WBC 13.6* 13.8*  NEUTROABS  --  10.7*  HGB 14.2 13.5  HCT 40.9 39.7  MCV 85.7 86.1  PLT 371 [id number]    Basic Metabolic Panel:  Recent Labs Lab 10/09/16 1130 10/09/16 1547 10/10/16 0440  NA 133* 135 133*  K 4.3 4.3 4.3  CL 98* 101 104  CO2 23 22 21*  GLUCOSE 447* 362* 282*  BUN 13 13 18   CREATININE 0.74 0.67 0.60  CALCIUM 9.4 8.9 9.1  MG  --   --  1.8    GFR: Estimated Creatinine Clearance: 110.2 mL/min (by C-G formula based on SCr of 0.6 mg/dL).  HbA1C:  Recent Labs  10/09/16 1130 10/09/16 1547  HGBA1C 10.7* 10.6*    CBG:  Recent Labs Lab 10/09/16 1652 10/09/16 1823 10/09/16 2119 10/10/16 0504 10/10/16 0730  GLUCAP 330* 355* 420* 258* 233*     Recent Results (from the past 240 hour(s))  Surgical pcr screen  Status: None   Collection Time: 10/09/16 11:12 AM  Result Value Ref Range Status   MRSA, PCR NEGATIVE NEGATIVE Final   Staphylococcus aureus NEGATIVE NEGATIVE Final    Comment:        The Xpert SA Assay (FDA approved for NASAL specimens in patients over 2 years of age), is one component of a comprehensive surveillance program.  Test performance has been validated by [facility] for patients greater than or equal to 25 year old. It is not intended to diagnose infection nor to guide or monitor treatment.       Radiology Studies: Dg Lumbar Spine 2-3 Views  Result Date: 10/09/2016 CLINICAL DATA:  Preop evaluation for upcoming lumbar surgery EXAM: LUMBAR SPINE - 2-3 VIEW COMPARISON:  11/22/2015 FINDINGS: Five lumbar type vertebral bodies are well  visualized. Mild retrolisthesis of L5 on S1 is noted. This appears slightly greater than that seen on prior exams. No soft tissue abnormality is noted. Lumbar segments were numbered for surgical evaluation. The nomenclature is similar to that used on prior exams. IMPRESSION: Mild retrolisthesis of L5 on S1. Electronically Signed   By: Inez Catalina M.D.   On: 10/09/2016 15:04     Medications:  Scheduled: . docusate sodium  100 mg Oral BID  . gabapentin  900 mg Oral TID  . insulin aspart  0-15 Units Subcutaneous TID WC  . insulin aspart  0-5 Units Subcutaneous QHS  . insulin aspart  10 Units Subcutaneous TID WC  . insulin detemir  44 Units Subcutaneous QHS  . lisinopril  5 mg Oral Daily  . meloxicam  7.5 mg Oral Daily  . polyethylene glycol  17 g Oral Daily  . senna  2 tablet Oral QHS   Continuous: . sodium chloride 75 mL/hr at 10/10/16 0846   [id number], methocarbamol, morphine injection, oxyCODONE  Assessment/Plan:  Active Problems:   Controlled type 2 diabetes mellitus with hyperglycemia (HCC)   Essential hypertension   Hyperglycemia   Lumbar herniated disc   Myelopathy (HCC)    Herniated disk with impingement of the L5-S1 nerve roots  Patient has had significant pain, tingling, and numbness which have been limiting her mobility. Continue pain medications as prescribed. Bowel regimen. Patient seen by orthopedics. Plan is for surgical intervention tomorrow.    Diabetes mellitus type 2 with hyperglycemia HbA1c is significantly elevated at greater than 10. Patient tells me that she is followed by endocrinology in [facility]. Dose of her insulin was being titrated over the last many months. She does check her blood sugars at home and these have been significantly elevated. She also received cortisone injection last week , which could've exacerbated her blood glucose levels as well. Increase dose of Levemir. Continue SSI. EKG did show PACs and evidence for prolonged QT interval.  Magnesium level was checked and is on 1.8.  Essential Hypertension Continue lisinopril  Microscopic hematuria Repeat UA.  DVT Prophylaxis: Lovenox on hold for surgery    Code Status: Full code  Family Communication: Discussed with the patient  Disposition Plan: Management as outlined above. Await surgical intervention tomorrow. Insulin dose adjusted.    LOS: 0 days   [facility] Pager [phone] 10/10/2016, 11:40 AM  If 7PM-7AM, please contact night-coverage at [email or web address], password [facility]

## 2016-10-11 ENCOUNTER — Ambulatory Visit (HOSPITAL_COMMUNITY)
Admission: RE | Admit: 2016-10-11 | Payer: Managed Care, Other (non HMO) | Source: Ambulatory Visit | Admitting: Specialist

## 2016-10-11 ENCOUNTER — Observation Stay (HOSPITAL_COMMUNITY): Payer: Managed Care, Other (non HMO)

## 2016-10-11 ENCOUNTER — Observation Stay (HOSPITAL_COMMUNITY): Payer: Managed Care, Other (non HMO) | Admitting: Registered Nurse

## 2016-10-11 ENCOUNTER — Encounter (HOSPITAL_COMMUNITY): Payer: Self-pay | Admitting: Registered Nurse

## 2016-10-11 ENCOUNTER — Encounter (HOSPITAL_COMMUNITY)
Admission: EM | Disposition: A | Discharge status: Home / Self Care | Payer: Self-pay | Source: Home / Self Care | Attending: Emergency Medicine

## 2016-10-11 DIAGNOSIS — I10 Essential (primary) hypertension: Secondary | ICD-10-CM | POA: Diagnosis not present

## 2016-10-11 DIAGNOSIS — Z794 Long term (current) use of insulin: Secondary | ICD-10-CM | POA: Diagnosis not present

## 2016-10-11 DIAGNOSIS — E1165 Type 2 diabetes mellitus with hyperglycemia: Secondary | ICD-10-CM | POA: Diagnosis not present

## 2016-10-11 DIAGNOSIS — M5431 Sciatica, right side: Secondary | ICD-10-CM | POA: Diagnosis not present

## 2016-10-11 HISTORY — PX: LUMBAR LAMINECTOMY/DECOMPRESSION MICRODISCECTOMY: SHX5026

## 2016-10-11 LAB — TYPE AND SCREEN
ABO/RH(D): A POS
ABO/RH(D): A POS
ANTIBODY SCREEN: NEGATIVE
Antibody Screen: NEGATIVE

## 2016-10-11 LAB — GLUCOSE, CAPILLARY
GLUCOSE-CAPILLARY: 274 mg/dL — AB (ref 65–99)
GLUCOSE-CAPILLARY: 253 mg/dL — AB (ref 65–99)
GLUCOSE-CAPILLARY: 211 mg/dL — AB (ref 65–99)
GLUCOSE-CAPILLARY: 136 mg/dL — AB (ref 65–99)
GLUCOSE-CAPILLARY: 108 mg/dL — AB (ref 65–99)
GLUCOSE-CAPILLARY: 195 mg/dL — AB (ref 65–99)
GLUCOSE-CAPILLARY: 326 mg/dL — AB (ref 65–99)
Glucose-Capillary: 114 mg/dL — ABNORMAL HIGH (ref 65–99)

## 2016-10-11 LAB — BASIC METABOLIC PANEL
Anion gap: 5 (ref 5–15)
BUN: 17 mg/dL (ref 6–20)
CALCIUM: 8.7 mg/dL — AB (ref 8.9–10.3)
CO2: 27 mmol/L (ref 22–32)
CREATININE: 0.49 mg/dL (ref 0.44–1.00)
Chloride: 104 mmol/L (ref 101–111)
GFR calc non Af Amer: >60 mL/min (ref >60)
GLUCOSE: 233 mg/dL — AB (ref 65–99)
Potassium: 3.7 mmol/L (ref 3.5–5.1)
Sodium: 136 mmol/L (ref 135–145)

## 2016-10-11 LAB — CBC
HCT: 35.2 % — ABNORMAL LOW (ref 36.0–46.0)
Hemoglobin: 12.2 g/dL (ref 12.0–15.0)
MCH: 29.4 pg (ref 26.0–34.0)
MCHC: 34.7 g/dL (ref 30.0–36.0)
MCV: 84.8 fL (ref 78.0–100.0)
PLATELETS: 299 10*3/uL (ref 150–400)
RBC: 4.15 MIL/uL (ref 3.87–5.11)
RDW: 13.3 % (ref 11.5–15.5)
WBC: 11.9 10*3/uL — ABNORMAL HIGH (ref 4.0–10.5)

## 2016-10-11 SURGERY — LUMBAR LAMINECTOMY/DECOMPRESSION MICRODISCECTOMY 1 LEVEL
Anesthesia: General | Site: Back | Laterality: Right

## 2016-10-11 MED ORDER — HYDROMORPHONE HCL 1 MG/ML IJ SOLN
0.2500 mg | INTRAMUSCULAR | Status: DC | PRN
  Administered 2016-10-11 (×4): 0.5 mg via INTRAVENOUS

## 2016-10-11 MED ORDER — RISAQUAD PO CAPS: 1.0000 | ORAL_CAPSULE | Freq: Every day | ORAL | Status: DC

## 2016-10-11 MED ORDER — SUCCINYLCHOLINE CHLORIDE 20 MG/ML IJ SOLN
INTRAMUSCULAR | Status: AC
  Filled 2016-10-11: qty 1

## 2016-10-11 MED ORDER — FENTANYL CITRATE (PF) 100 MCG/2ML IJ SOLN
INTRAMUSCULAR | Status: AC
  Filled 2016-10-11: qty 2

## 2016-10-11 MED ORDER — PROPOFOL 10 MG/ML IV BOLUS
INTRAVENOUS | Status: AC
  Filled 2016-10-11: qty 20

## 2016-10-11 MED ORDER — LIDOCAINE HCL (CARDIAC) 20 MG/ML IV SOLN
INTRAVENOUS | Status: DC | PRN
  Administered 2016-10-11: 100 mg via INTRAVENOUS

## 2016-10-11 MED ORDER — ACETAMINOPHEN 650 MG RE SUPP: 650.0000 mg | RECTAL | Status: DC | PRN

## 2016-10-11 MED ORDER — DOCUSATE SODIUM 100 MG PO CAPS: 100.0000 mg | ORAL_CAPSULE | Freq: Two times a day (BID) | ORAL | 1 refills | Status: DC | PRN

## 2016-10-11 MED ORDER — OXYCODONE HCL 5 MG PO TABS: 10.0000 mg | ORAL_TABLET | ORAL | 0 refills | Status: DC | PRN

## 2016-10-11 MED ORDER — METHOCARBAMOL 500 MG PO TABS
500.0000 mg | ORAL_TABLET | Freq: Four times a day (QID) | ORAL | Status: DC | PRN
  Administered 2016-10-12: 500 mg via ORAL
  Filled 2016-10-11 (×3): qty 1

## 2016-10-11 MED ORDER — ROCURONIUM BROMIDE 10 MG/ML (PF) SYRINGE
PREFILLED_SYRINGE | INTRAVENOUS | Status: DC | PRN
  Administered 2016-10-11: 50 mg via INTRAVENOUS
  Administered 2016-10-11: 10 mg via INTRAVENOUS

## 2016-10-11 MED ORDER — ROCURONIUM BROMIDE 50 MG/5ML IV SOSY
PREFILLED_SYRINGE | INTRAVENOUS | Status: AC
  Filled 2016-10-11: qty 5

## 2016-10-11 MED ORDER — PHENOL 1.4 % MT LIQD
1.0000 | OROMUCOSAL | Status: DC | PRN
Start: 2016-10-11 — End: 2016-10-12

## 2016-10-11 MED ORDER — ALUM & MAG HYDROXIDE-SIMETH 200-200-20 MG/5ML PO SUSP: 30.0000 mL | Freq: Four times a day (QID) | ORAL | Status: DC | PRN

## 2016-10-11 MED ORDER — HYDROCODONE-ACETAMINOPHEN 5-325 MG PO TABS: 1.0000 | ORAL_TABLET | ORAL | Status: DC | PRN

## 2016-10-11 MED ORDER — PHENYLEPHRINE HCL 10 MG/ML IJ SOLN
INTRAMUSCULAR | Status: AC
  Filled 2016-10-11: qty 2

## 2016-10-11 MED ORDER — MIDAZOLAM HCL 5 MG/5ML IJ SOLN
INTRAMUSCULAR | Status: DC | PRN
  Administered 2016-10-11: 2 mg via INTRAVENOUS

## 2016-10-11 MED ORDER — LIDOCAINE-EPINEPHRINE (PF) 1 %-1:200000 IJ SOLN
INTRAMUSCULAR | Status: DC | PRN
  Administered 2016-10-11: 10 mL

## 2016-10-11 MED ORDER — POLYETHYLENE GLYCOL 3350 17 G PO PACK: 17.0000 g | PACK | Freq: Every day | ORAL | 0 refills | Status: DC

## 2016-10-11 MED ORDER — METHOCARBAMOL 1000 MG/10ML IJ SOLN
500.0000 mg | Freq: Four times a day (QID) | INTRAVENOUS | Status: DC | PRN
  Filled 2016-10-11: qty 5

## 2016-10-11 MED ORDER — PHENYLEPHRINE HCL 10 MG/ML IJ SOLN
INTRAVENOUS | Status: DC | PRN
  Administered 2016-10-11: 10 ug/min via INTRAVENOUS

## 2016-10-11 MED ORDER — PHENYLEPHRINE 40 MCG/ML (10ML) SYRINGE FOR IV PUSH (FOR BLOOD PRESSURE SUPPORT)
PREFILLED_SYRINGE | INTRAVENOUS | Status: DC | PRN
  Administered 2016-10-11: 40 ug via INTRAVENOUS
  Administered 2016-10-11 (×2): 80 ug via INTRAVENOUS

## 2016-10-11 MED ORDER — PROMETHAZINE HCL 25 MG/ML IJ SOLN: 6.2500 mg | INTRAMUSCULAR | Status: DC | PRN

## 2016-10-11 MED ORDER — FENTANYL CITRATE (PF) 100 MCG/2ML IJ SOLN
INTRAMUSCULAR | Status: DC | PRN
  Administered 2016-10-11: 50 ug via INTRAVENOUS
  Administered 2016-10-11: 25 ug via INTRAVENOUS
  Administered 2016-10-11: 50 ug via INTRAVENOUS
  Administered 2016-10-11: 25 ug via INTRAVENOUS
  Administered 2016-10-11: 50 ug via INTRAVENOUS

## 2016-10-11 MED ORDER — LACTATED RINGERS IV SOLN
INTRAVENOUS | Status: DC
  Administered 2016-10-11: 12:00:00 via INTRAVENOUS

## 2016-10-11 MED ORDER — MENTHOL 3 MG MT LOZG: 1.0000 | LOZENGE | OROMUCOSAL | Status: DC | PRN

## 2016-10-11 MED ORDER — BISACODYL 5 MG PO TBEC: 5.0000 mg | DELAYED_RELEASE_TABLET | Freq: Every day | ORAL | Status: DC | PRN

## 2016-10-11 MED ORDER — PROPOFOL 10 MG/ML IV BOLUS
INTRAVENOUS | Status: DC | PRN
  Administered 2016-10-11: 170 mg via INTRAVENOUS

## 2016-10-11 MED ORDER — MIDAZOLAM HCL 2 MG/2ML IJ SOLN
INTRAMUSCULAR | Status: AC
  Filled 2016-10-11: qty 2

## 2016-10-11 MED ORDER — METHOCARBAMOL 500 MG PO TABS: 500.0000 mg | ORAL_TABLET | Freq: Four times a day (QID) | ORAL | 1 refills | Status: DC | PRN

## 2016-10-11 MED ORDER — VANCOMYCIN HCL IN DEXTROSE 1-5 GM/200ML-% IV SOLN
1000.0000 mg | Freq: Once | INTRAVENOUS | Status: AC
  Administered 2016-10-12: 1000 mg via INTRAVENOUS
  Filled 2016-10-11: qty 200

## 2016-10-11 MED ORDER — ACETAMINOPHEN 325 MG PO TABS: 650.0000 mg | ORAL_TABLET | ORAL | Status: DC | PRN

## 2016-10-11 MED ORDER — SCOPOLAMINE 1 MG/3DAYS TD PT72
1.0000 | MEDICATED_PATCH | TRANSDERMAL | Status: DC
  Filled 2016-10-11: qty 1

## 2016-10-11 MED ORDER — MAGNESIUM CITRATE PO SOLN
1.0000 | Freq: Once | ORAL | Status: DC | PRN
Start: 2016-10-11 — End: 2016-10-12

## 2016-10-11 MED ORDER — LISINOPRIL 5 MG PO TABS
5.0000 mg | ORAL_TABLET | Freq: Every day | ORAL | Status: DC
  Administered 2016-10-12: 5 mg via ORAL
  Filled 2016-10-11 (×2): qty 1

## 2016-10-11 MED ORDER — SCOPOLAMINE 1 MG/3DAYS TD PT72
MEDICATED_PATCH | TRANSDERMAL | Status: DC | PRN
  Administered 2016-10-11: 1 via TRANSDERMAL

## 2016-10-11 MED ORDER — KCL IN DEXTROSE-NACL 20-5-0.45 MEQ/L-%-% IV SOLN
INTRAVENOUS | Status: DC
  Filled 2016-10-11: qty 1000

## 2016-10-11 MED ORDER — PHENYLEPHRINE 40 MCG/ML (10ML) SYRINGE FOR IV PUSH (FOR BLOOD PRESSURE SUPPORT)
PREFILLED_SYRINGE | INTRAVENOUS | Status: AC
  Filled 2016-10-11: qty 10

## 2016-10-11 MED ORDER — LIDOCAINE 2% (20 MG/ML) 5 ML SYRINGE
INTRAMUSCULAR | Status: AC
  Filled 2016-10-11: qty 5

## 2016-10-11 MED ORDER — CEFAZOLIN SODIUM-DEXTROSE 2-4 GM/100ML-% IV SOLN
2.0000 g | INTRAVENOUS | Status: AC
  Administered 2016-10-11: 2 g via INTRAVENOUS

## 2016-10-11 MED ORDER — SUGAMMADEX SODIUM 200 MG/2ML IV SOLN
INTRAVENOUS | Status: DC | PRN
  Administered 2016-10-11: 200 mg via INTRAVENOUS
  Administered 2016-10-11: 2 mg via INTRAVENOUS

## 2016-10-11 MED ORDER — CEFAZOLIN SODIUM-DEXTROSE 2-4 GM/100ML-% IV SOLN: 2.0000 g | Freq: Four times a day (QID) | INTRAVENOUS | Status: DC

## 2016-10-11 MED ORDER — POLYETHYLENE GLYCOL 3350 17 G PO PACK: 17.0000 g | PACK | Freq: Every day | ORAL | Status: DC | PRN

## 2016-10-11 MED ORDER — LIDOCAINE-EPINEPHRINE 1 %-1:100000 IJ SOLN
INTRAMUSCULAR | Status: DC | PRN
  Administered 2016-10-11: 5 mL

## 2016-10-11 MED ORDER — POTASSIUM CHLORIDE IN NACL 20-0.9 MEQ/L-% IV SOLN
INTRAVENOUS | Status: DC
  Administered 2016-10-11: 18:00:00 via INTRAVENOUS
  Filled 2016-10-11 (×2): qty 1000

## 2016-10-11 MED ORDER — GABAPENTIN 300 MG PO CAPS
300.0000 mg | ORAL_CAPSULE | Freq: Three times a day (TID) | ORAL | Status: DC
  Administered 2016-10-11 – 2016-10-12 (×2): 300 mg via ORAL
  Filled 2016-10-11 (×2): qty 1

## 2016-10-11 MED ORDER — ONDANSETRON HCL 4 MG/2ML IJ SOLN
INTRAMUSCULAR | Status: DC | PRN
  Administered 2016-10-11: 4 mg via INTRAVENOUS

## 2016-10-11 MED ORDER — HYDROMORPHONE HCL 1 MG/ML IJ SOLN: 0.5000 mg | INTRAMUSCULAR | Status: DC | PRN

## 2016-10-11 MED ORDER — RISAQUAD PO CAPS
1.0000 | ORAL_CAPSULE | Freq: Every day | ORAL | Status: DC
  Administered 2016-10-11 – 2016-10-12 (×2): 1 via ORAL
  Filled 2016-10-11 (×2): qty 1

## 2016-10-11 MED ORDER — ONDANSETRON HCL 4 MG/2ML IJ SOLN: 4.0000 mg | INTRAMUSCULAR | Status: DC | PRN

## 2016-10-11 MED ORDER — ONDANSETRON HCL 4 MG/2ML IJ SOLN
INTRAMUSCULAR | Status: AC
  Filled 2016-10-11: qty 2

## 2016-10-11 MED ORDER — SUGAMMADEX SODIUM 200 MG/2ML IV SOLN
INTRAVENOUS | Status: AC
  Filled 2016-10-11: qty 2

## 2016-10-11 MED ORDER — SODIUM CHLORIDE 0.9 % IR SOLN
Status: DC | PRN
  Administered 2016-10-11: 500 mL

## 2016-10-11 MED ORDER — DOCUSATE SODIUM 100 MG PO CAPS
100.0000 mg | ORAL_CAPSULE | Freq: Two times a day (BID) | ORAL | Status: DC
  Administered 2016-10-11 – 2016-10-12 (×2): 100 mg via ORAL
  Filled 2016-10-11 (×2): qty 1

## 2016-10-11 SURGICAL SUPPLY — 48 items
BAG ZIPLOCK 12X15 (MISCELLANEOUS) IMPLANT
CLEANER TIP ELECTROSURG 2X2 (MISCELLANEOUS) ×3 IMPLANT
CLOSURE WOUND 1/2 X4 (GAUZE/BANDAGES/DRESSINGS) ×1
CLOTH 2% CHLOROHEXIDINE 3PK (PERSONAL CARE ITEMS) ×3 IMPLANT
DRAPE MICROSCOPE LEICA (MISCELLANEOUS) ×3 IMPLANT
DRAPE POUCH INSTRU U-SHP 10X18 (DRAPES) ×3 IMPLANT
DRAPE SHEET LG 3/4 BI-LAMINATE (DRAPES) ×6 IMPLANT
DRAPE SURG 17X11 SM STRL (DRAPES) ×3 IMPLANT
DRAPE UTILITY XL STRL (DRAPES) ×3 IMPLANT
DRSG AQUACEL AG ADV 3.5X 4 (GAUZE/BANDAGES/DRESSINGS) IMPLANT
DRSG AQUACEL AG ADV 3.5X 6 (GAUZE/BANDAGES/DRESSINGS) ×3 IMPLANT
DURAPREP 26ML APPLICATOR (WOUND CARE) ×3 IMPLANT
DURASEAL SPINE SEALANT 3ML (MISCELLANEOUS) IMPLANT
ELECT BLADE TIP CTD 4 INCH (ELECTRODE) IMPLANT
ELECT REM PT RETURN 9FT ADLT (ELECTROSURGICAL) ×3
ELECTRODE REM PT RTRN 9FT ADLT (ELECTROSURGICAL) ×1 IMPLANT
GLOVE BIOGEL PI IND STRL 7.0 (GLOVE) ×7 IMPLANT
GLOVE BIOGEL PI INDICATOR 7.0 (GLOVE) ×14
GLOVE SURG SS PI 7.0 STRL IVOR (GLOVE) ×3 IMPLANT
GLOVE SURG SS PI 7.5 STRL IVOR (GLOVE) ×3 IMPLANT
GLOVE SURG SS PI 8.0 STRL IVOR (GLOVE) ×6 IMPLANT
GOWN STRL REUS W/TWL XL LVL3 (GOWN DISPOSABLE) ×12 IMPLANT
HEMOSTAT SPONGE AVITENE ULTRA (HEMOSTASIS) ×3 IMPLANT
IV CATH 14GX2 1/4 (CATHETERS) ×3 IMPLANT
KIT BASIN OR (CUSTOM PROCEDURE TRAY) ×3 IMPLANT
KIT POSITIONING SURG ANDREWS (MISCELLANEOUS) ×3 IMPLANT
MANIFOLD NEPTUNE II (INSTRUMENTS) ×3 IMPLANT
NEEDLE SPNL 18GX3.5 QUINCKE PK (NEEDLE) ×6 IMPLANT
PACK LAMINECTOMY ORTHO (CUSTOM PROCEDURE TRAY) ×3 IMPLANT
PATTIES SURGICAL .5 X.5 (GAUZE/BANDAGES/DRESSINGS) IMPLANT
PATTIES SURGICAL .75X.75 (GAUZE/BANDAGES/DRESSINGS) IMPLANT
PATTIES SURGICAL 1X1 (DISPOSABLE) IMPLANT
RUBBERBAND STERILE (MISCELLANEOUS) ×6 IMPLANT
SPONGE SURGIFOAM ABS GEL 100 (HEMOSTASIS) ×3 IMPLANT
STAPLER VISISTAT (STAPLE) IMPLANT
STRIP CLOSURE SKIN 1/2X4 (GAUZE/BANDAGES/DRESSINGS) ×2 IMPLANT
SUT NURALON 4 0 TR CR/8 (SUTURE) IMPLANT
SUT PROLENE 3 0 PS 2 (SUTURE) ×3 IMPLANT
SUT VIC AB 1 CT1 27 (SUTURE)
SUT VIC AB 1 CT1 27XBRD ANTBC (SUTURE) IMPLANT
SUT VIC AB 1-0 CT2 27 (SUTURE) ×3 IMPLANT
SUT VIC AB 2-0 CT1 27 (SUTURE)
SUT VIC AB 2-0 CT1 TAPERPNT 27 (SUTURE) IMPLANT
SUT VIC AB 2-0 CT2 27 (SUTURE) ×6 IMPLANT
SYR 3ML LL SCALE MARK (SYRINGE) IMPLANT
TOWEL OR 17X26 10 PK STRL BLUE (TOWEL DISPOSABLE) ×3 IMPLANT
TOWEL OR NON WOVEN STRL DISP B (DISPOSABLE) IMPLANT
YANKAUER SUCT BULB TIP NO VENT (SUCTIONS) IMPLANT

## 2016-10-11 NOTE — Progress Notes (Signed)
Pt complains of some burning with urination. UA completed and results reviewed by NP Schorr. One time dose of pyridium ordered and 500cc NS fluid bolus ordered. Will continue to monitor.

## 2016-10-11 NOTE — Anesthesia Postprocedure Evaluation (Signed)
Anesthesia Post Note  Patient: Erica Nguyen  Procedure(s) Performed: Procedure(s) (LRB): REVISION MICRO LUMBAR DECOMPRESSION L5-S1 RIGHT (Right)  Patient location during evaluation: PACU Anesthesia Type: General Level of consciousness: sedated Pain management: pain level controlled Vital Signs Assessment: post-procedure vital signs reviewed and stable Respiratory status: spontaneous breathing and respiratory function stable Cardiovascular status: stable Anesthetic complications: no    Last Vitals:  Vitals:   10/11/16 1530 10/11/16 1600  BP: 139/80 (!) 142/74  Pulse: 93 92  Resp: 20 19  Temp:  36.7 C    Last Pain:  Vitals:   10/11/16 1600  TempSrc:   PainSc: [place]

## 2016-10-11 NOTE — Anesthesia Preprocedure Evaluation (Addendum)
Anesthesia Evaluation  Patient identified by MRN, date of birth, ID band Patient awake    Reviewed: Allergy & Precautions, NPO status , Patient's Chart, lab work & pertinent test results  History of Anesthesia Complications Negative for: history of anesthetic complications  Airway Mallampati: II  TM Distance: >3 FB Neck ROM: Full    Dental  (+) Dental Advisory Given, Upper Dentures   Pulmonary neg pulmonary ROS,    Pulmonary exam normal breath sounds clear to auscultation       Cardiovascular hypertension, Pt. on medications Normal cardiovascular exam Rhythm:Regular Rate:Normal  History of ?Angina:  LHC 04/2011: IMPRESSIONS: 1. Normal coronary arteries. 2. Normal LV (left ventricular) systolic function. 3. Mild to moderate elevation in left ventricular end-diastolic pressure secondary to hypertension, diabetes and obesity.   Neuro/Psych negative psych ROS   GI/Hepatic negative GI ROS, Neg liver ROS,   Endo/Other  diabetes, Poorly Controlled, Insulin DependentObesity   Renal/GU negative Renal ROS     Musculoskeletal negative musculoskeletal ROS (+)   Abdominal   Peds  Hematology negative hematology ROS (+)   Anesthesia Other Findings   Reproductive/Obstetrics                            Anesthesia Physical  Anesthesia Plan  ASA: III  Anesthesia Plan: General   Post-op Pain Management:    Induction: Intravenous  Airway Management Planned: Oral ETT  Additional Equipment:   Intra-op Plan:   Post-operative Plan: Extubation in OR  Informed Consent: I have reviewed the patients History and Physical, chart, labs and discussed the procedure including the risks, benefits and alternatives for the proposed anesthesia with the patient or authorized representative who has indicated his/her understanding and acceptance.   Dental advisory given  Plan Discussed with: CRNA and  Anesthesiologist  Anesthesia Plan Comments:        Anesthesia Quick Evaluation

## 2016-10-11 NOTE — Progress Notes (Signed)
Subjective: Pain well controlled this AM. Glucose was down to 180s yesterday but had a spike in pain and has been up some again since then. AM glucose 233 today. She is anxious for surgery today and to have some pain relief.    Objective: Vital signs in last 24 hours: Temp:  [98.1 F (36.7 C)-98.3 F (36.8 C)] 98.3 F (36.8 C) (10/20 0609) Pulse Rate:  [105-110] 110 (10/20 0609) Resp:  [16] 16 (10/20 0609) BP: (133-150)/(59-83) 133/76 (10/20 0609) SpO2:  [99 %-100 %] 99 % (10/20 0609)  Intake/Output from previous day: 10/19 0701 - 10/20 0700 In: 2469.2 [P.O.:600; I.V.:1869.2] Out: -  Intake/Output this shift: No intake/output data recorded.   Recent Labs  10/09/16 1130 10/09/16 1547 10/11/16 0515  HGB 14.2 13.5 12.2    Recent Labs  10/09/16 1547 10/11/16 0515  WBC 13.8* 11.9*  RBC 4.61 4.15  HCT 39.7 35.2*  PLT 316 299    Recent Labs  10/10/16 0440 10/11/16 0515  NA 133* 136  K 4.3 3.7  CL 104 104  CO2 21* 27  BUN 18 17  CREATININE 0.60 0.49  GLUCOSE 282* 233*  CALCIUM 9.1 8.7*   No results for input(s): LABPT, INR in the last 72 hours.  Neurologically intact ABD soft Neurovascular intact Sensation intact distally Intact pulses distally Dorsiflexion/Plantar flexion intact No cellulitis present Compartment soft no sign of DVT  Assessment/Plan: HNP L5-S1- awaiting surgery today by Dr. Tonita Cong Keep NPO All questions answered Pt spoke with Dr. Tonita Cong by phone this AM  Uncontrolled DM- glucose improving since admission Appreciate hospitalist input and management Emphasized importance of glycemic control to the pt post-op  BISSELL, JACLYN M. 10/11/2016, 7:52 AM

## 2016-10-11 NOTE — Transfer of Care (Signed)
Immediate Anesthesia Transfer of Care Note  Patient: Erica Nguyen  Procedure(s) Performed: Procedure(s): REVISION MICRO LUMBAR DECOMPRESSION L5-S1 RIGHT (Right)  Patient Location: PACU  Anesthesia Type:General  Level of Consciousness: awake, alert , oriented and patient cooperative  Airway & Oxygen Therapy: Patient Spontanous Breathing and Patient connected to face mask oxygen  Post-op Assessment: Report given to RN, Post -op Vital signs reviewed and stable and Patient moving all extremities X 4  Post vital signs: stable  Last Vitals:  Vitals:   10/10/16 2202 10/11/16 0609  BP: (!) 150/83 133/76  Pulse: (!) 105 (!) 110  Resp: 16 16  Temp: 36.8 C 36.8 C    Last Pain:  Vitals:   10/11/16 1002  TempSrc:   PainSc: 5       Patients Stated Pain Goal: 3 ([id number])  Complications: No apparent anesthesia complications

## 2016-10-11 NOTE — Brief Op Note (Signed)
10/09/2016 - 10/11/2016  2:37 PM  PATIENT:  Erica Nguyen  45 y.o. female  PRE-OPERATIVE DIAGNOSIS:  RECURRENT HNP L5-S1 RIGHT   POST-OPERATIVE DIAGNOSIS:  RECURRENT HNP L5-S1 RIGHT   PROCEDURE:  Procedure(s): REVISION MICRO LUMBER DECOMPRESSION L5-S1 RIGHT (Right)  SURGEON:  Surgeon(s) and Role:    * Susa Day, MD - Primary  PHYSICIAN ASSISTANT:   ASSISTANTS: Bissell   ANESTHESIA:   general 2EBL:  Total I/O In: 1000 [I.V.:1000] Out: [id number] [Urine:1250; Blood:15]  BLOOD ADMINISTERED:none  DRAINS: none   LOCAL MEDICATIONS USED:  XYLOCAINE   SPECIMEN:  Source of Specimen:  L5S1  DISPOSITION OF SPECIMEN:  PATHOLOGY  COUNTS:  YES  TOURNIQUET:  * No tourniquets in log *  DICTATION: .Other Dictation: Dictation Number [id number]  PLAN OF CARE: Admit for overnight observation  PATIENT DISPOSITION:  PACU - hemodynamically stable.   Delay start of Pharmacological VTE agent (>24hrs) due to surgical blood loss or risk of bleeding: yes

## 2016-10-11 NOTE — H&P (View-Only) (Signed)
Reason for Consult: Right leg pain Referring Physician: EDP  Erica Nguyen is an 45 y.o. female.  HPI: Recurrent HNP L5S1 pending surgery with uncontrolled diabetes Admitted for medical management.  Past Medical History:  Diagnosis Date  . Abnormal Pap smear   . Diabetes mellitus    insulin-dependent type 2  . Family history of adverse reaction to anesthesia    sister has n/v  . Fibroid   . HNP (herniated nucleus pulposus)   . Hypertension    chronic hypertension    Past Surgical History:  Procedure Laterality Date  . CARDIAC CATHETERIZATION  02/29/2008   Normal coronary arteries -- Normal LV (left ventricular) systolic function -- Mild to moderate elevation in left ventricular end-diastolic  pressure secondary to hypertension, diabetes and obesity  . CHOLECYSTECTOMY  1995  . DECOMPRESSIVE LUMBAR LAMINECTOMY LEVEL 1 Right 11/22/2015   Procedure: DECOMPRESSIVE LUMBAR LAMINECTOMY L5-S1 ON RIGHT,FORAMINOTOMIES L5,S1 DISCECTOMY L5,S1;  Surgeon: Susa Day, MD;  Location: [facility];  Service: Orthopedics;  Laterality: Right;  . GYNECOLOGIC CRYOSURGERY  1998  . Hysteroscopy, D&C, Novasure ablation, removal of Intrauterine device  01/08/2008  . [place] ABLATION  01/08/2008    Family History  Problem Relation Age of Onset  . Heart disease Mother   . Diabetes Mother   . Hypertension Father   . Diabetes Father   . Kidney failure Father   . Stroke Father   . Hypertension Paternal Grandmother   . Diabetes Paternal Grandmother   . Stroke Paternal Grandmother   . Diabetes Maternal Grandmother   . Ovarian cancer Maternal Grandmother   . Diabetes Maternal Grandfather   . Diabetes Paternal Grandfather     Social History:  reports that she has never smoked. She has never used smokeless tobacco. She reports that she does not drink alcohol or use drugs.  Allergies: No Active Allergies  Medications: I have reviewed the patient's current medications.  Results for orders placed or  performed during the hospital encounter of 10/09/16 (from the past 48 hour(s))  CBG monitoring, ED     Status: Abnormal   Collection Time: 10/09/16  1:53 PM  Result Value Ref Range   Glucose-Capillary 392 (H) 65 - 99 mg/dL  Basic metabolic panel     Status: Abnormal   Collection Time: 10/09/16  3:47 PM  Result Value Ref Range   Sodium 135 135 - 145 mmol/L   Potassium 4.3 3.5 - 5.1 mmol/L   Chloride 101 101 - 111 mmol/L   CO2 22 22 - 32 mmol/L   Glucose, Bld 362 (H) 65 - 99 mg/dL   BUN 13 6 - 20 mg/dL   Creatinine, Ser 0.67 0.44 - 1.00 mg/dL   Calcium 8.9 8.9 - 10.3 mg/dL   GFR calc non Af Amer >60 >60 mL/min   GFR calc Af Amer >60 >60 mL/min    Comment: (NOTE) The eGFR has been calculated using the CKD EPI equation. This calculation has not been validated in all clinical situations. eGFR's persistently <60 mL/min signify possible Chronic Kidney Disease.    Anion gap 12 5 - 15  CBC with Differential     Status: Abnormal   Collection Time: 10/09/16  3:47 PM  Result Value Ref Range   WBC 13.8 (H) 4.0 - 10.5 K/uL   RBC 4.61 3.87 - 5.11 MIL/uL   Hemoglobin 13.5 12.0 - 15.0 g/dL   HCT 39.7 36.0 - 46.0 %   MCV 86.1 78.0 - 100.0 fL   MCH 29.3 26.0 -  34.0 pg   MCHC 34.0 30.0 - 36.0 g/dL   RDW 13.2 11.5 - 15.5 %   Platelets 316 150 - 400 K/uL   Neutrophils Relative % 77 %   Neutro Abs 10.7 (H) 1.7 - 7.7 K/uL   Lymphocytes Relative 17 %   Lymphs Abs 2.3 0.7 - 4.0 K/uL   Monocytes Relative 6 %   Monocytes Absolute 0.8 0.1 - 1.0 K/uL   Eosinophils Relative 0 %   Eosinophils Absolute 0.0 0.0 - 0.7 K/uL   Basophils Relative 0 %   Basophils Absolute 0.0 0.0 - 0.1 K/uL  Hemoglobin A1c     Status: Abnormal   Collection Time: 10/09/16  3:47 PM  Result Value Ref Range   Hgb A1c MFr Bld 10.6 (H) 4.8 - 5.6 %    Comment: (NOTE)         Pre-diabetes: 5.7 - 6.4         Diabetes: >6.4         Glycemic control for adults with diabetes: <7.0    Mean Plasma Glucose 258 mg/dL    Comment:  (NOTE) Performed At: [facility] [place] [address] [place], [place] [id number] [place] William F MD Ph:[phone]   Blood gas, venous     Status: None   Collection Time: 10/09/16  3:50 PM  Result Value Ref Range   pH, Ven 7.357 7.250 - 7.430   pCO2, Ven 44.5 44.0 - 60.0 mmHg   pO2, Ven 38.5 32.0 - 45.0 mmHg   Bicarbonate 24.3 20.0 - 28.0 mmol/L   Acid-base deficit 0.9 0.0 - 2.0 mmol/L   O2 Saturation 64.9 %   Patient temperature 98.6    Collection site VEIN    Drawn by DRAWN BY RN    Sample type VENOUS   POC CBG, ED     Status: Abnormal   Collection Time: 10/09/16  4:52 PM  Result Value Ref Range   Glucose-Capillary 330 (H) 65 - 99 mg/dL  POC CBG, ED     Status: Abnormal   Collection Time: 10/09/16  6:23 PM  Result Value Ref Range   Glucose-Capillary 355 (H) 65 - 99 mg/dL  Glucose, capillary     Status: Abnormal   Collection Time: 10/09/16  9:19 PM  Result Value Ref Range   Glucose-Capillary 420 (H) 65 - 99 mg/dL  Urinalysis, Routine w reflex microscopic     Status: Abnormal   Collection Time: 10/10/16 12:47 AM  Result Value Ref Range   Color, Urine YELLOW YELLOW   APPearance CLEAR CLEAR   Specific Gravity, Urine 1.039 (H) 1.005 - 1.030   pH 5.5 5.0 - 8.0   Glucose, UA >1000 (A) NEGATIVE mg/dL   Hgb urine dipstick LARGE (A) NEGATIVE   Bilirubin Urine NEGATIVE NEGATIVE   Ketones, ur >80 (A) NEGATIVE mg/dL   Protein, ur NEGATIVE NEGATIVE mg/dL   Nitrite NEGATIVE NEGATIVE   Leukocytes, UA NEGATIVE NEGATIVE  Urine microscopic-add on     Status: Abnormal   Collection Time: 10/10/16 12:47 AM  Result Value Ref Range   Squamous Epithelial / LPF 0-5 (A) NONE SEEN   WBC, UA 0-5 0 - 5 WBC/hpf   RBC / HPF TOO NUMEROUS TO COUNT 0 - 5 RBC/hpf   Bacteria, UA FEW (A) NONE SEEN  Basic metabolic panel     Status: Abnormal   Collection Time: 10/10/16  4:40 AM  Result Value Ref Range   Sodium 133 (L) 135 - 145 mmol/L   Potassium   4.3 3.5 - 5.1 mmol/L   Chloride 104 101 - 111  mmol/L   CO2 21 (L) 22 - 32 mmol/L   Glucose, Bld 282 (H) 65 - 99 mg/dL   BUN 18 6 - 20 mg/dL   Creatinine, Ser 0.60 0.44 - 1.00 mg/dL   Calcium 9.1 8.9 - 10.3 mg/dL   GFR calc non Af Amer >60 >60 mL/min   GFR calc Af Amer >60 >60 mL/min    Comment: (NOTE) The eGFR has been calculated using the CKD EPI equation. This calculation has not been validated in all clinical situations. eGFR's persistently <60 mL/min signify possible Chronic Kidney Disease.    Anion gap 8 5 - 15    Dg Lumbar Spine 2-3 Views  Result Date: 10/09/2016 CLINICAL DATA:  Preop evaluation for upcoming lumbar surgery EXAM: LUMBAR SPINE - 2-3 VIEW COMPARISON:  11/22/2015 FINDINGS: Five lumbar type vertebral bodies are well visualized. Mild retrolisthesis of L5 on S1 is noted. This appears slightly greater than that seen on prior exams. No soft tissue abnormality is noted. Lumbar segments were numbered for surgical evaluation. The nomenclature is similar to that used on prior exams. IMPRESSION: Mild retrolisthesis of L5 on S1. Electronically Signed   By: Mark  Lukens M.D.   On: 10/09/2016 15:04    Review of Systems  Constitutional: Positive for malaise/fatigue.  HENT: Negative.   Eyes: Negative.   Respiratory: Negative.   Cardiovascular: Negative.   Gastrointestinal: Negative.   Genitourinary: Negative.   Musculoskeletal: Positive for back pain.  Skin: Negative.   Neurological: Positive for focal weakness and weakness.  Endo/Heme/Allergies: Negative.   Psychiatric/Behavioral: Negative.    Blood pressure (!) 167/87, pulse (!) 110, temperature 98.2 F (36.8 C), temperature source Oral, resp. rate 16, height 5' 5" (1.651 m), weight 107 kg (236 lb), SpO2 98 %. Physical Exam  Constitutional: She is oriented to person, place, and time. She appears well-developed.  HENT:  Head: Normocephalic.  Eyes: Pupils are equal, round, and reactive to light.  Neck: Normal range of motion.  Cardiovascular: Normal rate.    Respiratory: Effort normal.  GI: Soft.  Musculoskeletal:  SLR positive right. EHL, PF 4/5 right. Decreased sensation S1 dermatome. No DVT.   Neurological: She is alert and oriented to person, place, and time. She displays abnormal reflex.  Skin: Skin is warm and dry.  Psychiatric: She has a normal mood and affect.    Assessment/Plan:  Right L5S1 radiculopathy due to HNP L5S1. Improving diabetic control. Plan Decompression tomorrow after glucose normalization. Hold lovenox for today. PAS and OOB. Appreciate Dr. Mckenzie's help.  BEANE,JEFFREY C  [phone] 10/10/2016, 7:16 AM     

## 2016-10-11 NOTE — Op Note (Signed)
Erica Nguyen, Erica Nguyen                 ACCOUNT NO.:  [id number]  MEDICAL RECORD NO.:  [id number]  LOCATION:  [id number]                         FACILITY:  [facility]  PHYSICIAN:  Susa Day, M.D.    DATE OF BIRTH:  1971/01/02  DATE OF PROCEDURE:  10/11/2016 DATE OF DISCHARGE:                              OPERATIVE REPORT   PREOPERATIVE DIAGNOSES: 1. Spinal stenosis; recurrent disk herniation, L5-S1, right. 2. Elevated BMI of 38.  POSTOPERATIVE DIAGNOSES: 1. Spinal stenosis; recurrent disk herniation, L5-S1, right. 2. Elevated BMI of 38.  PROCEDURES PERFORMED: 1. Redo lumbar decompression and microdecompression, L5-S1, right. 2. Foraminotomies, L5-S1, right. 3. Microdiskectomy, L5-S1, right.  ANESTHESIA:  General.  ASSISTANT:  Cleophas Dunker, PA.  Technical difficulty increased due to the patient's elevated BMI.  HISTORY:  A 44, experienced a recurrent disk herniation with severe leg pain.  MRI indicating large disk herniation compressing the L5-S1 nerve root.  She had myotomal weakness, dermatomal dysesthesias, elevated blood glucose over 400.  She was admitted to the hospital for blood sugar control and pain control.  She underwent preoperative glucose optimization and pain control.  We indicated her for microlumbar redo decompression due to persistent neurologic deficit and large neurocompressive lesion, unable to participate physical therapy.  Risk and benefits were discussed including bleeding, infection, damage to neurovascular structures, DVT, PE, anesthetic complications, need for fusion in the future, no change in symptoms, worsening symptoms, etc.  TECHNIQUE:  With the patient in supine position after the induction of adequate general anesthesia, a gram of vancomycin, she was placed prone on the [place] frame.  All bony prominences were well padded.  Lumbar region was prepped and draped in usual sterile fashion.  Previous surgical incision was utilized and made incision  through the skin only. Subcutaneous tissue was dissected.  Electrocautery was utilized to achieve hemostasis.  Dorsolumbar fascia identified, divided in line with the skin incision.  Infiltrated with lidocaine with epinephrine. Paraspinous muscle elevated.  The extra long retractors were utilized. We identified the previous hemilaminotomy at L5-S1.  We skeletonized the previous lamina of 5.  Confirmatory radiograph obtained.  We meticulously developed a plane between the epidural fibrosis and the previous laminotomy.  Both cephalad laterally and caudad.  We then placed a neural patty beneath the bony elements and enlarged the hemilaminotomy of the caudad edge of 5 and then laterally preserving the pars.  We identified the facet and the superior articulating process of S1.  With neural elements well protected, we performed a partial medial hemi-facetectomy with 2-mm Kerrison, decompressed the lateral recess to medial border of the pedicle and performed a foraminotomy of L5 and S1. I preserved the pars.  Identified the S1 nerve root and from the disk space medially, solid extruded fragment.  We gently removed multiple fragments from beneath the thecal sac in the epidural fibrosis at the disk space, removed multiple free fragments.  We entered the disk space and removed additional fragments.  We further mobilized with an Epstein and a Woodson retractor both caudad and cephalad, and beneath the thecal sac.  Large fragments were removed in their entirety, were consistent with that seen on the MRI.  Following this, we  irrigated the disk space with antibiotic irrigation.  Additional fragments were retrieved.  No probe passed freely at the foramen of L5 and S1 following decompression. There was good restoration of the thecal sac.  No evidence of CSF leakage or active bleeding.  Bipolar electrocautery was utilized to achieve hemostasis.  Confirmatory radiograph obtained with a Penfield in the disk  space.  Next, we re-examined, there was no evidence of CSF leakage or active bleeding.  A 1 cm of excursion of the S1 nerve root beneath the pedicle. We checked the axilla up to the pedicle of 5, beneath the thecal sac. No evidence of residual disk herniation, amenable to retrieval.  Sent that to Pathology.  We then removed the [facility] retractor, irrigated the paraspinous musculature.  No active bleeding or CSF leakage.  We closed the dorsolumbar fascia with 1 Vicryl, subcu with multiple 2-0 and skin with Prolene due to the previous surgery.  Sterile dressing applied.  Placed supine on the hospital bed, extubated without difficulty, and transported to the recovery room in satisfactory condition.  The patient tolerated the procedure well.  No complications.  Assistant, Cleophas Dunker, [place].  Blood loss was minimal.  Dr. Phoebe Sharps was used throughout the case for patient positioning, closure, intermittent neural traction.     Susa Day, M.D.     Geralynn Rile  D:  10/11/2016  T:  10/11/2016  Job:  [id number]

## 2016-10-11 NOTE — Discharge Instructions (Signed)
Walk As Tolerated utilizing back precautions.  No bending, twisting, or lifting.  No driving for 2 weeks.   °Aquacel dressing may remain in place until follow up. May shower with aquacel dressing in place. If the dressing peels off or becomes saturated, you may remove aquacel dressing and place gauze and tape dressing which should be kept clean and dry and changed daily. Do not remove steri-strips if they are present. °See Dr. Annjanette Wertenberger in office in 10 to 14 days. Begin taking aspirin 81mg per day starting 4 days after your surgery if not allergic to aspirin or on another blood thinner. °Walk daily even outside. Use a cane or walker only if necessary. °Avoid sitting on soft sofas. ° °

## 2016-10-11 NOTE — Anesthesia Procedure Notes (Signed)
Procedure Name: Intubation Date/Time: 10/11/2016 1:02 PM Performed by: Carleene Cooper A Pre-anesthesia Checklist: Patient identified, Timeout performed, Emergency Drugs available, Suction available and Patient being monitored Patient Re-evaluated:Patient Re-evaluated prior to inductionOxygen Delivery Method: Circle system utilized Preoxygenation: Pre-oxygenation with 100% oxygen Intubation Type: IV induction Ventilation: Mask ventilation without difficulty Laryngoscope Size: Mac and 4 Grade View: Grade I Tube type: Oral Tube size: 7.5 mm Number of attempts: 1 Airway Equipment and Method: Stylet Placement Confirmation: ETT inserted through vocal cords under direct vision,  positive ETCO2 and breath sounds checked- equal and bilateral Secured at: 22 cm Tube secured with: Tape Dental Injury: Teeth and Oropharynx as per pre-operative assessment

## 2016-10-11 NOTE — Interval H&P Note (Signed)
History and Physical Interval Note:  10/11/2016 12:44 PM  Erica Nguyen  has presented today for surgery, with the diagnosis of RECURRENT HNP L5-S1 RIGHT   The various methods of treatment have been discussed with the patient and family. After consideration of risks, benefits and other options for treatment, the patient has consented to  Procedure(s): REVISION MICRO LUMBER DECOMPRESSION L5-S1 RIGHT (Right) as a surgical intervention .  The patient's history has been reviewed, patient examined, no change in status, stable for surgery.  I have reviewed the patient's chart and labs.  Questions were answered to the patient's satisfaction.     August Gosser C

## 2016-10-11 NOTE — Progress Notes (Signed)
[facility] PROGRESS NOTE  Erica Nguyen [id number] DOB: [birth year] DOA: 10/09/2016  PCP: Alanson Aly, MD  Brief History/Interval Summary: 45 year old female with a past medical history of hypertension, diabetes mellitus type 2, herniated disc, status post L5-S1 decompression laminectomy in November 2016, presented with back pain with radiation of the pain down her right leg. Plan is for another surgical intervention. Preoperative labs were done which showed significant hyperglycemia. Patient was sent over to the emergency department. Since surgery was planned for Friday, it was felt that she will need to be optimized and so she was hospitalized for further management.  Reason for Visit: Back pain and hyperglycemia  Consultants: orthopedics  Procedures: Plan is for revision micro lumbar decompression L4-S1 today  Antibiotics: None  Subjective/Interval History: Patient continues to have severe back pain. She is looking forward to surgical intervention today. Denies any nausea, vomiting.   ROS: No chest pain or shortness of breath.  Objective:  Vital Signs  Vitals:   10/10/16 0700 10/10/16 1400 10/10/16 2202 10/11/16 0609  BP: (!) 159/98 (!) 139/59 (!) 150/83 133/76  Pulse: (!) 105 (!) 106 (!) 105 (!) 110  Resp:   16 16  Temp: 98.6 F (37 C) 98.1 F (36.7 C) 98.3 F (36.8 C) 98.3 F (36.8 C)  TempSrc: Oral Axillary Oral Oral  SpO2: 100% 100% 100% 99%  Weight:      Height:        Intake/Output Summary (Last 24 hours) at 10/11/16 0847 Last data filed at 10/11/16 0754  Gross per 24 hour  Intake          2469.17 ml  Output              900 ml  Net          1569.17 ml   Filed Weights   10/09/16 1832  Weight: 107 kg (236 lb)    General appearance: alert, cooperative, appears stated age and no distress Resp: clear to auscultation bilaterally Cardio: regular rate and rhythm, S1, S2 normal, no murmur, click, rub or gallop GI: soft, non-tender; bowel  sounds normal; no masses,  no organomegaly Neurologic: Awake and alert. Oriented 3. Cranial no strokes 12 intact. Able to lift both legs off the bed, although she is somewhat weaker on the right side. This is most likely secondary to pain issues.  Lab Results:  Data Reviewed: I have personally reviewed following labs and imaging studies  CBC:  Recent Labs Lab 10/09/16 1130 10/09/16 1547 10/11/16 0515  WBC 13.6* 13.8* 11.9*  NEUTROABS  --  10.7*  --   HGB 14.2 13.5 12.2  HCT 40.9 39.7 35.2*  MCV 85.7 86.1 84.8  PLT 371 316 [id number]    Basic Metabolic Panel:  Recent Labs Lab 10/09/16 1130 10/09/16 1547 10/10/16 0440 10/11/16 0515  NA 133* 135 133* 136  K 4.3 4.3 4.3 3.7  CL 98* 101 104 104  CO2 23 22 21* 27  GLUCOSE 447* 362* 282* 233*  BUN 13 13 18 17   CREATININE 0.74 0.67 0.60 0.49  CALCIUM 9.4 8.9 9.1 8.7*  MG  --   --  1.8  --     GFR: Estimated Creatinine Clearance: 110.2 mL/min (by C-G formula based on SCr of 0.49 mg/dL).  HbA1C:  Recent Labs  10/09/16 1130 10/09/16 1547  HGBA1C 10.7* 10.6*    CBG:  Recent Labs Lab 10/09/16 1823 10/09/16 2119 10/10/16 0504 10/10/16 0730 10/10/16 1214  GLUCAP 355* 420* 258* 233* 188*  Recent Results (from the past 240 hour(s))  Surgical pcr screen     Status: None   Collection Time: 10/09/16 11:12 AM  Result Value Ref Range Status   MRSA, PCR NEGATIVE NEGATIVE Final   Staphylococcus aureus NEGATIVE NEGATIVE Final    Comment:        The Xpert SA Assay (FDA approved for NASAL specimens in patients over 49 years of age), is one component of a comprehensive surveillance program.  Test performance has been validated by [facility] for patients greater than or equal to 65 year old. It is not intended to diagnose infection nor to guide or monitor treatment.       Radiology Studies: Dg Lumbar Spine 2-3 Views  Result Date: 10/09/2016 CLINICAL DATA:  Preop evaluation for upcoming lumbar surgery EXAM:  LUMBAR SPINE - 2-3 VIEW COMPARISON:  11/22/2015 FINDINGS: Five lumbar type vertebral bodies are well visualized. Mild retrolisthesis of L5 on S1 is noted. This appears slightly greater than that seen on prior exams. No soft tissue abnormality is noted. Lumbar segments were numbered for surgical evaluation. The nomenclature is similar to that used on prior exams. IMPRESSION: Mild retrolisthesis of L5 on S1. Electronically Signed   By: Inez Catalina M.D.   On: 10/09/2016 15:04     Medications:  Scheduled: . docusate sodium  100 mg Oral BID  . gabapentin  900 mg Oral TID  . insulin aspart  0-15 Units Subcutaneous TID WC  . insulin aspart  0-5 Units Subcutaneous QHS  . insulin aspart  10 Units Subcutaneous TID WC  . insulin detemir  44 Units Subcutaneous QHS  . lisinopril  5 mg Oral Daily  . meloxicam  7.5 mg Oral Daily  . polyethylene glycol  17 g Oral Daily  . senna  2 tablet Oral QHS   Continuous: . sodium chloride 75 mL/hr at 10/10/16 2331   [id number], methocarbamol, morphine injection, oxyCODONE  Assessment/Plan:  Active Problems:   Controlled type 2 diabetes mellitus with hyperglycemia (HCC)   Essential hypertension   Hyperglycemia   Lumbar herniated disc   Myelopathy ([place])   Uncontrolled type 2 diabetes mellitus without complication, with long-term current use of insulin (HCC)   Sciatica of right side    Herniated disk with impingement of the L5-S1 nerve roots  Patient has had significant pain, tingling, and numbness which have been limiting her mobility. Continue pain medications as prescribed. Bowel regimen. Patient seen by orthopedics. Plan is for surgical intervention today.    Diabetes mellitus type 2 with hyperglycemia HbA1c is significantly elevated at greater than 10. Patient tells me that she is followed by endocrinology in [facility]. Dose of her insulin was being titrated over the last many months. She does check her blood sugars at home and these have been  significantly elevated. She also received cortisone injection last week , which could've exacerbated her blood glucose levels as well. Dose of Levemir was increased yesterday. Continue current dose for now. Continue SSI.   Essential Hypertension Continue lisinopril  Microscopic hematuria Repeat UA also revealed hemoglobin along with RBCs. She will need outpatient workup for the same.  DVT Prophylaxis: Lovenox on hold for surgery    Code Status: Full code  Family Communication: Discussed with the patient  Disposition Plan: Management as outlined above. Await surgical intervention.     LOS: 0 days   [place] [facility] Pager [phone] 10/11/2016, 8:47 AM  If 7PM-7AM, please contact night-coverage at [email or web address], password [facility]

## 2016-10-12 DIAGNOSIS — E1165 Type 2 diabetes mellitus with hyperglycemia: Secondary | ICD-10-CM | POA: Diagnosis not present

## 2016-10-12 DIAGNOSIS — Z794 Long term (current) use of insulin: Secondary | ICD-10-CM | POA: Diagnosis not present

## 2016-10-12 LAB — BASIC METABOLIC PANEL
Anion gap: 8 (ref 5–15)
BUN: 12 mg/dL (ref 6–20)
CHLORIDE: 103 mmol/L (ref 101–111)
CO2: 26 mmol/L (ref 22–32)
CREATININE: 0.46 mg/dL (ref 0.44–1.00)
Calcium: 8.5 mg/dL — ABNORMAL LOW (ref 8.9–10.3)
GFR calc non Af Amer: >60 mL/min (ref >60)
Glucose, Bld: 232 mg/dL — ABNORMAL HIGH (ref 65–99)
Potassium: 3.7 mmol/L (ref 3.5–5.1)
Sodium: 137 mmol/L (ref 135–145)

## 2016-10-12 LAB — GLUCOSE, CAPILLARY
GLUCOSE-CAPILLARY: 95 mg/dL (ref 65–99)
Glucose-Capillary: 168 mg/dL — ABNORMAL HIGH (ref 65–99)

## 2016-10-12 LAB — CBC
HEMATOCRIT: 35 % — AB (ref 36.0–46.0)
HEMOGLOBIN: 11.8 g/dL — AB (ref 12.0–15.0)
MCH: 29.7 pg (ref 26.0–34.0)
MCHC: 33.7 g/dL (ref 30.0–36.0)
MCV: 88.2 fL (ref 78.0–100.0)
Platelets: 267 10*3/uL (ref 150–400)
RBC: 3.97 MIL/uL (ref 3.87–5.11)
RDW: 13.5 % (ref 11.5–15.5)
WBC: 11.2 10*3/uL — ABNORMAL HIGH (ref 4.0–10.5)

## 2016-10-12 MED ORDER — IBUPROFEN 200 MG PO TABS: 800.0000 mg | ORAL_TABLET | Freq: Four times a day (QID) | ORAL | 0 refills | Status: DC | PRN

## 2016-10-12 MED ORDER — INSULIN ASPART 100 UNIT/ML ~~LOC~~ SOLN: 30.0000 [IU] | Freq: Two times a day (BID) | SUBCUTANEOUS | 3 refills | Status: DC

## 2016-10-12 MED ORDER — LEVEMIR FLEXTOUCH 100 UNIT/ML ~~LOC~~ SOPN: 46.0000 [IU] | PEN_INJECTOR | Freq: Every evening | SUBCUTANEOUS | 3 refills | Status: DC

## 2016-10-12 NOTE — Care Management Note (Addendum)
Case Management Note  Patient Details  Name: Erica Nguyen MRN: [id number] Date of Birth: [birth year]  Subjective/Objective:   revision micro lumbar decompression L5-S1 right                   Action/Plan: Discharge Planning: AVS reviewed:  NCM spoke to pt and she has RW at home. No needs identified. Pt states DME belongs to her mother. Requesting RW for home. Contacted [facility] DME rep for RW. Pt states her Levemir is $43 for 90 day supply.   PCP -Meredith Pel, MONICA MD  Expected Discharge Date:  10/12/2016             Expected Discharge Plan:  Home/Self Care  In-House Referral:  NA  Discharge planning Services  CM Consult  Post Acute Care Choice:  NA Choice offered to:  NA  DME Arranged:  N/A DME Agency:  NA  HH Arranged:  NA HH Agency:  NA  Status of Service:  Completed, signed off  If discussed at [place] of Stay Meetings, dates discussed:    Additional Comments:  Erenest Rasher, RN 10/12/2016, 11:39 AM

## 2016-10-12 NOTE — Discharge Summary (Signed)
[facility]  Physician Discharge Summary   Patient ID: Erica Nguyen MRN: [id number] DOB/AGE: 1971-09-24 45 y.o.  Admit date: 10/09/2016 Discharge date: 10/12/2016  PCP: Alanson Aly, MD  DISCHARGE DIAGNOSES:  Active Problems:   Controlled type 2 diabetes mellitus with hyperglycemia ([place])   Essential hypertension   Hyperglycemia   Lumbar herniated disc   Myelopathy ([place])   Uncontrolled type 2 diabetes mellitus without complication, with long-term current use of insulin ([facility])   Sciatica of right side   RECOMMENDATIONS FOR OUTPATIENT FOLLOW UP: 1. Follow-up with orthopedics to be determined by them 2. Patient instructed to follow-up with her PCP or endocrinologist within the next 1-2 weeks for further management of her diabetes. 3. Repeat UA in 1-2 weeks when she is off of her periods.   DISCHARGE CONDITION: fair  Diet recommendation: Modified carbohydrate  Filed Weights   10/09/16 1832  Weight: 107 kg (236 lb)    INITIAL HISTORY: 45 year old female with a past medical history of hypertension, diabetes mellitus type 2, herniated disc, status post L5-S1 decompression laminectomy in November 2016, presented with back pain with radiation of the pain down her right leg. Plan is for another surgical intervention. Preoperative labs were done which showed significant hyperglycemia. Patient was sent over to the emergency department. Since surgery was planned for Friday, it was felt that she will need to be optimized and so she was hospitalized for further management.  Consultations:  Orthopedics  Procedures: revision micro lumbar decompression L4-S1 10/20  HOSPITAL COURSE:   Herniated disk with impingement of the L5-S1 nerve roots Patient has had significant pain, tingling, and numbness which have been limiting her mobility. Patient was seen by orthopedics. Plan was for lumbar decompression. This was done yesterday. Patient's symptoms have significantly improved.  Patient was seen by physical therapy and has ambulated without much difficulty. Okay for discharge home today.  Diabetes mellitus type 2 with hyperglycemia HbA1c was significantly elevated at greater than 10. Patient tells me that she is followed by endocrinology in [facility]. Dose of her insulin was being titrated over the last many months. She does check her blood sugars at home and these have been significantly elevated. She also received cortisone injection last week , which could've exacerbated her blood glucose levels as well. Dose of Levemir has been increased. She should follow up with her outpatient providers for further management.  Essential Hypertension Continue lisinopril  Microscopic hematuria Repeat UA also revealed hemoglobin along with RBCs. Patient tells me that she has menstrual periods currently, which is likely the reason for the blood in the urine.  Overall much improved. Cleared by orthopedics this morning. Okay for discharge home today. She has good support system at home.   PERTINENT LABS:  The results of significant diagnostics from this hospitalization (including imaging, microbiology, ancillary and laboratory) are listed below for reference.    Microbiology: Recent Results (from the past 240 hour(s))  Surgical pcr screen     Status: None   Collection Time: 10/09/16 11:12 AM  Result Value Ref Range Status   MRSA, PCR NEGATIVE NEGATIVE Final   Staphylococcus aureus NEGATIVE NEGATIVE Final    Comment:        The Xpert SA Assay (FDA approved for NASAL specimens in patients over 64 years of age), is one component of a comprehensive surveillance program.  Test performance has been validated by [facility] for patients greater than or equal to 32 year old. It is not intended to diagnose infection nor to  guide or monitor treatment.      Labs: Basic Metabolic Panel:  Recent Labs Lab 10/09/16 1130 10/09/16 1547 10/10/16 0440 10/11/16 0515  10/12/16 0519  NA 133* 135 133* 136 137  K 4.3 4.3 4.3 3.7 3.7  CL 98* 101 104 104 103  CO2 23 22 21* 27 26  GLUCOSE 447* 362* 282* 233* 232*  BUN 13 13 18 17 12   CREATININE 0.74 0.67 0.60 0.49 0.46  CALCIUM 9.4 8.9 9.1 8.7* 8.5*  MG  --   --  1.8  --   --     CBC:  Recent Labs Lab 10/09/16 1130 10/09/16 1547 10/11/16 0515 10/12/16 0519  WBC 13.6* 13.8* 11.9* 11.2*  NEUTROABS  --  10.7*  --   --   HGB 14.2 13.5 12.2 11.8*  HCT 40.9 39.7 35.2* 35.0*  MCV 85.7 86.1 84.8 88.2  PLT 371 316 299 267   CBG:  Recent Labs Lab 10/11/16 1504 10/11/16 1819 10/11/16 2134 10/12/16 0746 10/12/16 1141  GLUCAP 108* 195* 326* 168* 95     IMAGING STUDIES Dg Lumbar Spine 2-3 Views  Result Date: 10/09/2016 CLINICAL DATA:  Preop evaluation for upcoming lumbar surgery EXAM: LUMBAR SPINE - 2-3 VIEW COMPARISON:  11/22/2015 FINDINGS: Five lumbar type vertebral bodies are well visualized. Mild retrolisthesis of L5 on S1 is noted. This appears slightly greater than that seen on prior exams. No soft tissue abnormality is noted. Lumbar segments were numbered for surgical evaluation. The nomenclature is similar to that used on prior exams. IMPRESSION: Mild retrolisthesis of L5 on S1. Electronically Signed   By: Inez Catalina M.D.   On: 10/09/2016 15:04   Dg Spine Portable 1 View  Result Date: 10/11/2016 CLINICAL DATA:  Revision right L5-S1 micro lumbar decompression. EXAM: PORTABLE SPINE - 1 VIEW COMPARISON:  10/11/2016 FINDINGS: Vertebral body alignment and heights are within normal. There is mild disc space narrowing at the L4-5 and L5-S1 levels. There is mild spondylosis throughout the lumbar spine. Surgical instrument is present from posterior approach with tip over the disc space at the L5-S1 level. Correlation with findings at the time of the procedure IMPRESSION: Surgical instrument from posterior approach with tip over the disc space at the L5-S1 level. Mild spondylosis of the lumbar spine  with mild disc disease at the L4-5 and L5-S1 levels. Electronically Signed   By: Marin Olp M.D.   On: 10/11/2016 14:34   Dg Spine Portable 1 View  Result Date: 10/11/2016 CLINICAL DATA:  Revision micro lumbar decompression at L5-S1. EXAM: PORTABLE SPINE - 1 VIEW COMPARISON:  10/09/2016 FINDINGS: Tissue spreaders are in place at L5-S1 with a probe oriented towards the L5-S1 intervertebral disc. Small sclerotic lesion in the L4 vertebral body, unchanged from prior. IMPRESSION: 1. Intraoperative localization of the L5-S1 level. Electronically Signed   By: Van Clines M.D.   On: 10/11/2016 13:45    DISCHARGE EXAMINATION: Vitals:   10/12/16 0034 10/12/16 0446 10/12/16 0913 10/12/16 0926  BP: 121/68 110/66 108/60 119/67  Pulse: 91 (!) 101 99   Resp: 16 14 18    Temp: 98.8 F (37.1 C) 98.2 F (36.8 C) 98 F (36.7 C)   TempSrc: Oral Oral Oral   SpO2: 100% 100% 99%   Weight:      Height:       General appearance: alert, cooperative, appears stated age and no distress Resp: clear to auscultation bilaterally Cardio: regular rate and rhythm, S1, S2 normal, no murmur, click, rub or gallop  GI: soft, non-tender; bowel sounds normal; no masses,  no organomegaly Extremities: extremities normal, atraumatic, no cyanosis or edema  DISPOSITION: Home  Discharge Instructions    Call MD for:  difficulty breathing, headache or visual disturbances    Complete by:  As directed    Call MD for:  extreme fatigue    Complete by:  As directed    Call MD for:  hives    Complete by:  As directed    Call MD for:  persistant dizziness or light-headedness    Complete by:  As directed    Call MD for:  persistant nausea and vomiting    Complete by:  As directed    Call MD for:  severe uncontrolled pain    Complete by:  As directed    Call MD for:  temperature >100.4    Complete by:  As directed    Diet Carb Modified    Complete by:  As directed    Discharge instructions    Complete by:  As  directed    Please check your blood glucose levels three times daily, before meal, and maintain a log for your PCP. Take your medications as prescribed.  You were cared for by a hospitalist during your hospital stay. If you have any questions about your discharge medications or the care you received while you were in the hospital after you are discharged, you can call the unit and asked to speak with the hospitalist on call if the hospitalist that took care of you is not available. Once you are discharged, your primary care physician will handle any further medical issues. Please note that NO REFILLS for any discharge medications will be authorized once you are discharged, as it is imperative that you return to your primary care physician (or establish a relationship with a primary care physician if you do not have one) for your aftercare needs so that they can reassess your need for medications and monitor your lab values. If you do not have a primary care physician, you can call [phone] for a physician referral.   Increase activity slowly    Complete by:  As directed       ALLERGIES: No Known Allergies   Discharge Medication List as of 10/12/2016 11:49 AM    START taking these medications   Details  docusate sodium (COLACE) 100 MG capsule Take 1 capsule (100 mg total) by mouth 2 (two) times daily as needed for mild constipation., Starting Fri 10/11/2016, Print    methocarbamol (ROBAXIN) 500 MG tablet Take 1 tablet (500 mg total) by mouth every 6 (six) hours as needed for muscle spasms., Starting Fri 10/11/2016, Print    oxyCODONE (OXY IR/ROXICODONE) 5 MG immediate release tablet Take 2 tablets (10 mg total) by mouth every 4 (four) hours as needed for severe pain., Starting Fri 10/11/2016, Print    polyethylene glycol (MIRALAX / GLYCOLAX) packet Take 17 g by mouth daily., Starting Fri 10/11/2016, Print      CONTINUE these medications which have CHANGED   Details  ibuprofen  (ADVIL,MOTRIN) 200 MG tablet Take 4 tablets (800 mg total) by mouth every 6 (six) hours as needed (for pain.). May resume 5 days post-op, Starting Sat 10/12/2016, No Print    insulin aspart (NOVOLOG) 100 UNIT/ML injection Inject 30 Units into the skin 2 (two) times daily., Starting Sat 10/12/2016, Print    LEVEMIR FLEXTOUCH 100 UNIT/ML Pen Inject 46 Units into the skin every evening., Starting Sat 10/12/2016, Print  CONTINUE these medications which have NOT CHANGED   Details  gabapentin (NEURONTIN) 300 MG capsule Take 900 mg by mouth 3 (three) times daily., Starting Tue 09/24/2016, Historical Med    lisinopril (PRINIVIL,ZESTRIL) 5 MG tablet Take 5 mg by mouth daily., Historical Med      STOP taking these medications     oxyCODONE-acetaminophen (PERCOCET/ROXICET) 5-325 MG tablet          Follow-up Information    BEANE,JEFFREY C, MD Follow up in 2 week(s).   Specialty:  Orthopedic Surgery Contact information: [address] [place] [address] [id number]        DOERR,MONICA, MD. Schedule an appointment as soon as possible for a visit in 1 week(s).   Specialty:  Internal Medicine Why:  for further management of diabetes. You can also see your endocrionologist instead of PCP. Contact information: [place] [address] [phone]           TOTAL DISCHARGE TIME: 35 minutes  [facility] Pager [phone]  10/12/2016, 12:59 PM

## 2016-10-12 NOTE — Progress Notes (Signed)
Subjective: 1 Day Post-Op Procedure(s) (LRB): REVISION MICRO LUMBAR DECOMPRESSION L5-S1 RIGHT (Right) Patient reports pain as mild.   Doing well, seen in AM rounds with Dr. Gladstone Lighter. Leg pain much improved. Mild incisional pain. Foley removed this AM has not voided yet. Feels ready to go home.  Objective: Vital signs in last 24 hours: Temp:  [98 F (36.7 C)-99.5 F (37.5 C)] 98 F (36.7 C) (10/21 0913) Pulse Rate:  [90-104] 99 (10/21 0913) Resp:  [14-20] 18 (10/21 0913) BP: (108-149)/(60-84) 119/67 (10/21 0926) SpO2:  [97 %-100 %] 99 % (10/21 0913)  Intake/Output from previous day: 10/20 0701 - 10/21 0700 In: 2350 [I.V.:2350] Out: 3165 [Urine:3150; Blood:15] Intake/Output this shift: Total I/O In: 120 [P.O.:120] Out: -    Recent Labs  10/09/16 1130 10/09/16 1547 10/11/16 0515 10/12/16 0519  HGB 14.2 13.5 12.2 11.8*    Recent Labs  10/11/16 0515 10/12/16 0519  WBC 11.9* 11.2*  RBC 4.15 3.97  HCT 35.2* 35.0*  PLT 299 267    Recent Labs  10/11/16 0515 10/12/16 0519  NA 136 137  K 3.7 3.7  CL 104 103  CO2 27 26  BUN 17 12  CREATININE 0.49 0.46  GLUCOSE 233* 232*  CALCIUM 8.7* 8.5*   No results for input(s): LABPT, INR in the last 72 hours.  Neurologically intact ABD soft Neurovascular intact Sensation intact distally Intact pulses distally Dorsiflexion/Plantar flexion intact Incision: dressing C/D/I and no drainage No cellulitis present Compartment soft no calf pain or sign of DVT  Assessment/Plan: 1 Day Post-Op Procedure(s) (LRB): REVISION MICRO LUMBAR DECOMPRESSION L5-S1 RIGHT (Right) Advance diet Up with therapy D/C IV fluids  D/C home today Needs to void and ambulate prior to D/C Discussed D/C instructions, Lspine precautions, dressing instructions Follow up 10-14 days post-op  BISSELL, JACLYN M. 10/12/2016, 9:43 AM

## 2016-10-12 NOTE — Progress Notes (Signed)
OT Cancellation Note  Patient Details Name: Erica Nguyen MRN: [id number] DOB: 1971-08-16   Cancelled Treatment:    Reason Eval/Treat Not Completed: OT screened, no needs identified, will sign off -- Patient has been through previous back surgery and recalls all ADL techniques. Patient denies need for ADL review and has all necessary DME at home. Will sign off.  Vermon Grays A 10/12/2016, 9:41 AM

## 2016-10-12 NOTE — Evaluation (Signed)
Physical Therapy Evaluation Patient Details Name: Erica Nguyen MRN: [id number] DOB: 20-Sep-1971 Today's Date: 10/12/2016   History of Present Illness  REVISION MICRO LUMBAR DECOMPRESSION L5-S1 RIGHT (Right)  Clinical Impression  Reviewed with patient the precautions, she verbalizes understanding. Plans to Dc today. No further PT needs.  Follow Up Recommendations      Equipment Recommendations  None recommended by PT    Recommendations for Other Services       Precautions / Restrictions Precautions Precautions: Back      Mobility  Bed Mobility Overal bed mobility: Modified Independent                Transfers Overall transfer level: Modified independent                  Ambulation/Gait Ambulation/Gait assistance: Modified independent (Device/Increase time) Ambulation Distance (Feet): 100 Feet Assistive device: Rolling walker (2 wheeled) Gait Pattern/deviations: Step-through pattern        Stairs            Wheelchair Mobility    Modified Rankin (Stroke Patients Only)       Balance                                             Pertinent Vitals/Pain Pain Assessment: 0-10 Pain Score: 4  Pain Location: R leg Pain Descriptors / Indicators: Aching Pain Intervention(s): Monitored during session;Patient requesting pain meds-RN notified    Home Living Family/patient expects to be discharged to:: Private residence Living Arrangements: Children;Other relatives Available Help at Discharge: Family Type of Home: House Home Access: Stairs to enter Entrance Stairs-Rails: None Entrance Stairs-Number of Steps: 3 Home Layout: Two level;1/2 bath on main level;Bed/bath upstairs Home Equipment: Walker - 2 wheels      Prior Function                 Hand Dominance        Extremity/Trunk Assessment   Upper Extremity Assessment: Overall WFL for tasks assessed           Lower Extremity Assessment: Overall WFL for  tasks assessed         Communication      Cognition Arousal/Alertness: Awake/alert Behavior During Therapy: WFL for tasks assessed/performed Overall Cognitive Status: Within Functional Limits for tasks assessed                      General Comments      Exercises     Assessment/Plan    PT Assessment Patent does not need any further PT services  PT Problem List            PT Treatment Interventions      PT Goals (Current goals can be found in the Care Plan section)  Acute Rehab PT Goals Patient Stated Goal: to go home PT Goal Formulation: All assessment and education complete, DC therapy    Frequency     Barriers to discharge        Co-evaluation               End of Session   Activity Tolerance: Patient tolerated treatment well Patient left: in chair;with call bell/phone within reach Nurse Communication: Mobility status    Functional Assessment Tool Used: clinical judgement Functional Limitation: Mobility: Walking and moving around Mobility: Walking and Moving Around Current Status [id number]): At  least 1 percent but less than 20 percent impaired, limited or restricted Mobility: Walking and Moving Around Goal Status [phone]): At least 1 percent but less than 20 percent impaired, limited or restricted Mobility: Walking and Moving Around Discharge Status [phone]): At least 1 percent but less than 20 percent impaired, limited or restricted    Time: 0920-0947 PT Time Calculation (min) (ACUTE ONLY): 27 min   Charges:   PT Evaluation $PT Eval Low Complexity: 1 Procedure PT Treatments $Gait Training: 8-22 mins   PT G Codes:   PT G-Codes **NOT FOR INPATIENT CLASS** Functional Assessment Tool Used: clinical judgement Functional Limitation: Mobility: Walking and moving around Mobility: Walking and Moving Around Current Status [id number]): At least 1 percent but less than 20 percent impaired, limited or restricted Mobility: Walking and Moving Around Goal  Status [phone]): At least 1 percent but less than 20 percent impaired, limited or restricted Mobility: Walking and Moving Around Discharge Status [phone]): At least 1 percent but less than 20 percent impaired, limited or restricted    Marcelino Freestone PT [id number]  10/12/2016, 9:55 AM

## 2016-12-06 DIAGNOSIS — R52 Pain, unspecified: Secondary | ICD-10-CM

## 2016-12-06 DIAGNOSIS — R262 Difficulty in walking, not elsewhere classified: Secondary | ICD-10-CM

## 2016-12-12 ENCOUNTER — Other Ambulatory Visit: Payer: Self-pay | Admitting: Obstetrics and Gynecology

## 2017-04-07 NOTE — Progress Notes (Signed)
No longer being seen by me as patient

## 2017-07-03 ENCOUNTER — Other Ambulatory Visit: Payer: Self-pay | Admitting: Obstetrics and Gynecology

## 2017-07-03 DIAGNOSIS — Z1231 Encounter for screening mammogram for malignant neoplasm of breast: Secondary | ICD-10-CM

## 2017-07-20 ENCOUNTER — Emergency Department (HOSPITAL_COMMUNITY): Payer: 59

## 2017-07-20 ENCOUNTER — Encounter (HOSPITAL_COMMUNITY): Payer: Self-pay | Admitting: Emergency Medicine

## 2017-07-20 ENCOUNTER — Inpatient Hospital Stay (HOSPITAL_COMMUNITY)
Admission: EM | Admit: 2017-07-20 | Discharge: 2017-07-23 | DRG: 247 | Disposition: A | Discharge status: Home / Self Care | Payer: 59 | Attending: Cardiology | Admitting: Cardiology

## 2017-07-20 DIAGNOSIS — I214 Non-ST elevation (NSTEMI) myocardial infarction: Principal | ICD-10-CM | POA: Diagnosis present

## 2017-07-20 DIAGNOSIS — Z794 Long term (current) use of insulin: Secondary | ICD-10-CM | POA: Diagnosis not present

## 2017-07-20 DIAGNOSIS — I2510 Atherosclerotic heart disease of native coronary artery without angina pectoris: Secondary | ICD-10-CM | POA: Diagnosis present

## 2017-07-20 DIAGNOSIS — E1165 Type 2 diabetes mellitus with hyperglycemia: Secondary | ICD-10-CM | POA: Diagnosis present

## 2017-07-20 DIAGNOSIS — R072 Precordial pain: Secondary | ICD-10-CM | POA: Diagnosis present

## 2017-07-20 DIAGNOSIS — Z8249 Family history of ischemic heart disease and other diseases of the circulatory system: Secondary | ICD-10-CM

## 2017-07-20 DIAGNOSIS — Z6841 Body Mass Index (BMI) 40.0 and over, adult: Secondary | ICD-10-CM | POA: Diagnosis not present

## 2017-07-20 DIAGNOSIS — Z833 Family history of diabetes mellitus: Secondary | ICD-10-CM | POA: Diagnosis not present

## 2017-07-20 DIAGNOSIS — I255 Ischemic cardiomyopathy: Secondary | ICD-10-CM | POA: Diagnosis present

## 2017-07-20 DIAGNOSIS — I10 Essential (primary) hypertension: Secondary | ICD-10-CM | POA: Diagnosis present

## 2017-07-20 DIAGNOSIS — Z955 Presence of coronary angioplasty implant and graft: Secondary | ICD-10-CM

## 2017-07-20 LAB — I-STAT CHEM 8, ED
BUN: 14 mg/dL (ref 6–20)
CHLORIDE: 102 mmol/L (ref 101–111)
Calcium, Ion: 1.03 mmol/L — ABNORMAL LOW (ref 1.15–1.40)
Creatinine, Ser: 0.5 mg/dL (ref 0.44–1.00)
GLUCOSE: 347 mg/dL — AB (ref 65–99)
HEMATOCRIT: 36 % (ref 36.0–46.0)
Hemoglobin: 12.2 g/dL (ref 12.0–15.0)
POTASSIUM: 3.7 mmol/L (ref 3.5–5.1)
Sodium: 138 mmol/L (ref 135–145)
TCO2: 26 mmol/L (ref 0–100)

## 2017-07-20 LAB — CBC
HEMATOCRIT: 37.6 % (ref 36.0–46.0)
HEMOGLOBIN: 12.6 g/dL (ref 12.0–15.0)
MCH: 28.3 pg (ref 26.0–34.0)
MCHC: 33.5 g/dL (ref 30.0–36.0)
MCV: 84.5 fL (ref 78.0–100.0)
Platelets: 327 10*3/uL (ref 150–400)
RBC: 4.45 MIL/uL (ref 3.87–5.11)
RDW: 13.7 % (ref 11.5–15.5)
WBC: 12.1 10*3/uL — AB (ref 4.0–10.5)

## 2017-07-20 LAB — BASIC METABOLIC PANEL
Anion gap: 12 (ref 5–15)
BUN: 12 mg/dL (ref 6–20)
CHLORIDE: 103 mmol/L (ref 101–111)
CO2: 23 mmol/L (ref 22–32)
Calcium: 8.8 mg/dL — ABNORMAL LOW (ref 8.9–10.3)
Creatinine, Ser: 0.66 mg/dL (ref 0.44–1.00)
GFR calc Af Amer: >60 mL/min (ref >60)
GFR calc non Af Amer: >60 mL/min (ref >60)
GLUCOSE: 343 mg/dL — AB (ref 65–99)
POTASSIUM: 4 mmol/L (ref 3.5–5.1)
SODIUM: 138 mmol/L (ref 135–145)

## 2017-07-20 LAB — I-STAT TROPONIN, ED: Troponin i, poc: 0.79 ng/mL (ref 0.00–0.08)

## 2017-07-20 LAB — GLUCOSE, CAPILLARY
GLUCOSE-CAPILLARY: 257 mg/dL — AB (ref 65–99)
Glucose-Capillary: 290 mg/dL — ABNORMAL HIGH (ref 65–99)

## 2017-07-20 LAB — MAGNESIUM: Magnesium: 1.6 mg/dL — ABNORMAL LOW (ref 1.7–2.4)

## 2017-07-20 LAB — MRSA PCR SCREENING: MRSA BY PCR: NEGATIVE

## 2017-07-20 LAB — PROTIME-INR
INR: 1
Prothrombin Time: 13.2 seconds (ref 11.4–15.2)

## 2017-07-20 LAB — HEPARIN LEVEL (UNFRACTIONATED): Heparin Unfractionated: 0.11 IU/mL — ABNORMAL LOW (ref 0.30–0.70)

## 2017-07-20 LAB — TROPONIN I: Troponin I: 0.2 ng/mL (ref <0.03)

## 2017-07-20 LAB — I-STAT BETA HCG BLOOD, ED (MC, WL, AP ONLY)

## 2017-07-20 LAB — APTT: APTT: 49 s — AB (ref 24–36)

## 2017-07-20 MED ORDER — HEPARIN (PORCINE) IN NACL 100-0.45 UNIT/ML-% IJ SOLN
1300.0000 [IU]/h | INTRAMUSCULAR | Status: DC
  Administered 2017-07-20: 1050 [IU]/h via INTRAVENOUS
  Administered 2017-07-21: 1300 [IU]/h via INTRAVENOUS
  Filled 2017-07-20 (×2): qty 250

## 2017-07-20 MED ORDER — INSULIN GLARGINE 100 UNIT/ML ~~LOC~~ SOLN
10.0000 [IU] | Freq: Once | SUBCUTANEOUS | Status: AC
Start: 2017-07-20 — End: 2017-07-20
  Administered 2017-07-20: 10 [IU] via SUBCUTANEOUS
  Filled 2017-07-20: qty 0.1

## 2017-07-20 MED ORDER — INSULIN GLARGINE 100 UNIT/ML ~~LOC~~ SOLN
20.0000 [IU] | Freq: Every day | SUBCUTANEOUS | Status: DC
  Filled 2017-07-20 (×2): qty 0.2

## 2017-07-20 MED ORDER — HEPARIN BOLUS VIA INFUSION
2000.0000 [IU] | Freq: Once | INTRAVENOUS | Status: AC
  Administered 2017-07-20: 2000 [IU] via INTRAVENOUS
  Filled 2017-07-20: qty 2000

## 2017-07-20 MED ORDER — NITROGLYCERIN IN D5W 200-5 MCG/ML-% IV SOLN
10.0000 ug/min | INTRAVENOUS | Status: DC
  Administered 2017-07-20: 10 ug/min via INTRAVENOUS

## 2017-07-20 MED ORDER — SODIUM CHLORIDE 0.9 % WEIGHT BASED INFUSION: 1.0000 mL/kg/h | INTRAVENOUS | Status: DC

## 2017-07-20 MED ORDER — ATORVASTATIN CALCIUM 80 MG PO TABS
80.0000 mg | ORAL_TABLET | Freq: Every day | ORAL | Status: DC
  Administered 2017-07-20 – 2017-07-22 (×2): 80 mg via ORAL
  Filled 2017-07-20 (×2): qty 1

## 2017-07-20 MED ORDER — CLOPIDOGREL BISULFATE 75 MG PO TABS
75.0000 mg | ORAL_TABLET | Freq: Every day | ORAL | Status: DC
  Administered 2017-07-21: 75 mg via ORAL
  Filled 2017-07-20: qty 1

## 2017-07-20 MED ORDER — MORPHINE SULFATE (PF) 4 MG/ML IV SOLN
4.0000 mg | Freq: Once | INTRAVENOUS | Status: AC
  Administered 2017-07-20: 4 mg via INTRAVENOUS
  Filled 2017-07-20: qty 1

## 2017-07-20 MED ORDER — LORAZEPAM 2 MG/ML IJ SOLN
1.0000 mg | Freq: Once | INTRAMUSCULAR | Status: AC
  Administered 2017-07-20: 1 mg via INTRAVENOUS
  Filled 2017-07-20: qty 1

## 2017-07-20 MED ORDER — NITROGLYCERIN 0.4 MG SL SUBL: 0.4000 mg | SUBLINGUAL_TABLET | SUBLINGUAL | Status: DC | PRN

## 2017-07-20 MED ORDER — ONDANSETRON HCL 4 MG/2ML IJ SOLN
4.0000 mg | Freq: Once | INTRAMUSCULAR | Status: AC
  Administered 2017-07-20: 4 mg via INTRAVENOUS
  Filled 2017-07-20: qty 2

## 2017-07-20 MED ORDER — MORPHINE SULFATE (PF) 2 MG/ML IV SOLN: 2.0000 mg | INTRAVENOUS | Status: DC | PRN

## 2017-07-20 MED ORDER — SODIUM CHLORIDE 0.9% FLUSH: 3.0000 mL | INTRAVENOUS | Status: DC | PRN

## 2017-07-20 MED ORDER — GUAIFENESIN-DM 100-10 MG/5ML PO SYRP
5.0000 mL | ORAL_SOLUTION | ORAL | Status: DC | PRN
  Administered 2017-07-20 – 2017-07-21 (×3): 5 mL via ORAL
  Filled 2017-07-20 (×3): qty 5

## 2017-07-20 MED ORDER — ASPIRIN 300 MG RE SUPP: 300.0000 mg | RECTAL | Status: AC

## 2017-07-20 MED ORDER — ASPIRIN 81 MG PO CHEW
324.0000 mg | CHEWABLE_TABLET | ORAL | Status: AC
  Administered 2017-07-20: 324 mg via ORAL
  Filled 2017-07-20: qty 4

## 2017-07-20 MED ORDER — ACETAMINOPHEN 325 MG PO TABS: 650.0000 mg | ORAL_TABLET | ORAL | Status: DC | PRN

## 2017-07-20 MED ORDER — SODIUM CHLORIDE 0.9 % IV SOLN
INTRAVENOUS | Status: DC
  Administered 2017-07-20: 17:00:00 via INTRAVENOUS

## 2017-07-20 MED ORDER — NITROPRUSSIDE SODIUM 25 MG/ML IV SOLN: 0.0000 ug/kg/min | Freq: Once | INTRAVENOUS | Status: DC

## 2017-07-20 MED ORDER — HEPARIN BOLUS VIA INFUSION
4000.0000 [IU] | Freq: Once | INTRAVENOUS | Status: AC
  Administered 2017-07-20: 4000 [IU] via INTRAVENOUS
  Filled 2017-07-20: qty 4000

## 2017-07-20 MED ORDER — NITROGLYCERIN IN D5W 200-5 MCG/ML-% IV SOLN
0.0000 ug/min | Freq: Once | INTRAVENOUS | Status: AC
  Administered 2017-07-20: 5 ug/min via INTRAVENOUS
  Filled 2017-07-20: qty 250

## 2017-07-20 MED ORDER — ASPIRIN EC 81 MG PO TBEC
81.0000 mg | DELAYED_RELEASE_TABLET | Freq: Every day | ORAL | Status: DC
  Administered 2017-07-21 – 2017-07-23 (×3): 81 mg via ORAL
  Filled 2017-07-20 (×3): qty 1

## 2017-07-20 MED ORDER — SODIUM CHLORIDE 0.9 % IV SOLN
Freq: Once | INTRAVENOUS | Status: AC
  Administered 2017-07-20: 12:00:00 via INTRAVENOUS

## 2017-07-20 MED ORDER — SODIUM CHLORIDE 0.9 % IV BOLUS (SEPSIS)
1000.0000 mL | Freq: Once | INTRAVENOUS | Status: AC
  Administered 2017-07-20: 1000 mL via INTRAVENOUS

## 2017-07-20 MED ORDER — METOPROLOL TARTRATE 25 MG PO TABS
25.0000 mg | ORAL_TABLET | Freq: Two times a day (BID) | ORAL | Status: DC
  Administered 2017-07-20 – 2017-07-22 (×4): 25 mg via ORAL
  Filled 2017-07-20 (×5): qty 1

## 2017-07-20 MED ORDER — SODIUM CHLORIDE 0.9% FLUSH
3.0000 mL | Freq: Two times a day (BID) | INTRAVENOUS | Status: DC
  Administered 2017-07-21: 3 mL via INTRAVENOUS

## 2017-07-20 MED ORDER — SODIUM CHLORIDE 0.9 % IV SOLN: 250.0000 mL | INTRAVENOUS | Status: DC | PRN

## 2017-07-20 MED ORDER — ONDANSETRON HCL 4 MG/2ML IJ SOLN
4.0000 mg | Freq: Four times a day (QID) | INTRAMUSCULAR | Status: DC | PRN
  Administered 2017-07-20 – 2017-07-23 (×3): 4 mg via INTRAVENOUS
  Filled 2017-07-20 (×3): qty 2

## 2017-07-20 MED ORDER — INSULIN ASPART 100 UNIT/ML ~~LOC~~ SOLN
0.0000 [IU] | Freq: Three times a day (TID) | SUBCUTANEOUS | Status: DC
  Administered 2017-07-20 – 2017-07-21 (×3): 5 [IU] via SUBCUTANEOUS
  Administered 2017-07-21: 3 [IU] via SUBCUTANEOUS

## 2017-07-20 MED ORDER — CLOPIDOGREL BISULFATE 75 MG PO TABS
300.0000 mg | ORAL_TABLET | Freq: Once | ORAL | Status: AC
  Administered 2017-07-20: 300 mg via ORAL
  Filled 2017-07-20: qty 4

## 2017-07-20 MED ORDER — IOPAMIDOL (ISOVUE-370) INJECTION 76%
INTRAVENOUS | Status: AC
  Administered 2017-07-20: 100 mL
  Filled 2017-07-20: qty 100

## 2017-07-20 NOTE — Progress Notes (Signed)
Advised by MD to increase nitro till patient is pain free.

## 2017-07-20 NOTE — ED Triage Notes (Signed)
Per ems, pt c/o Cp since this morning, heavy sensation in chest, HR spikes with ambulation, pt sob. Last BP 140/60. Sinus tach 116 at rest. Chest pain is reproducible with palpation. CBG 411, hx of diabetes, didn't take insulin this morning.

## 2017-07-20 NOTE — Progress Notes (Signed)
ANTICOAGULATION CONSULT NOTE - Initial Consult  Pharmacy Consult for heparin Indication: ACS  No Known Allergies  Patient Measurements: Height: 5\' 5"  (165.1 cm) Weight: 242 lb (109.8 kg) IBW/kg (Calculated) : 57 Heparin Dosing Weight: 82.8 kg  Assessment: 46 yo F presents on 7/29 with CP. No anticoag PTA. CBC stable. No s/s of bleed.  Goal of Therapy:  Heparin level 0.3-0.7 units/ml Monitor platelets by anticoagulation protocol: Yes   Plan:  Give heparin 4,000 unit bolus Start heparin gtt at 1,050 units/hr Check 6 hr heparin level Monitor daily heparin level, CBC, s/s of bleed   Elenor Quinones, PharmD, BCPS Clinical Pharmacist Pager [phone] 07/20/2017 1:28 PM

## 2017-07-20 NOTE — ED Notes (Signed)
Pt to CT

## 2017-07-20 NOTE — ED Provider Notes (Signed)
[facility] DEPT Provider Note   CSN: [id number] Arrival date & time: 07/20/17  1023     History   Chief Complaint Chief Complaint  Patient presents with  . Chest Pain    HPI   Blood pressure 140/90, pulse (!) 117, temperature 98.3 F (36.8 C), resp. rate 16, height 5\' 5"  (1.651 m), weight 109.8 kg (242 lb), last menstrual period 07/20/2017, SpO2 92 %.  Erica Nguyen is a 46 y.o. female with past medical history significant for hypertension, obesity, type 2 insulin-dependent diabetes complaining of a left-sided retrosternal nonradiating chest pain which she describes as pressure-like with associated shortness of breath onset this morning. She states that a pressure-like sensation on her chest is made worse when she walks, this is non-positional. She endorses a mild cough with fever, syncope, numbness or weakness. There is no associated nausea or diaphoresis. She states that she feels warm. She is no history of DVT/PE, recent immobilization, calf pain, leg swelling. She was given full dose aspirin by EMS in route. Mother had a heart attack at around age 71, she had a cath about 15 years ago, no stents were placed, his regular follow with cardiology. She states the pain is 7 out of 10 right now, after she woke up and walks she states the pain was 10 out of 10, there is been no syncope, no numbness or weakness.  Past Medical History:  Diagnosis Date  . Abnormal Pap smear   . Diabetes mellitus    insulin-dependent type 2  . Family history of adverse reaction to anesthesia    sister has n/v  . Fibroid   . HNP (herniated nucleus pulposus)   . Hypertension    chronic hypertension    Patient Active Problem List   Diagnosis Date Noted  . Ambulatory dysfunction   . Intractable pain   . Uncontrolled type 2 diabetes mellitus without complication, with long-term current use of insulin ([place])   . Sciatica of right side   . Hyperglycemia 10/09/2016  . Lumbar herniated disc 10/09/2016  .  Myelopathy ([place]) 10/09/2016  . Spinal stenosis of lumbar region 11/22/2015  . Fibroids, intramural 06/27/2014  . Chest pain 01/06/2012  . Dyspnea 01/06/2012  . Controlled type 2 diabetes mellitus with hyperglycemia ([place]) 09/25/2007  . Essential hypertension 09/25/2007    Past Surgical History:  Procedure Laterality Date  . CARDIAC CATHETERIZATION  02/29/2008   Normal coronary arteries -- Normal LV (left ventricular) systolic function -- Mild to moderate elevation in left ventricular end-diastolic  pressure secondary to hypertension, diabetes and obesity  . CHOLECYSTECTOMY  1995  . DECOMPRESSIVE LUMBAR LAMINECTOMY LEVEL 1 Right 11/22/2015   Procedure: DECOMPRESSIVE LUMBAR LAMINECTOMY L5-S1 ON RIGHT,FORAMINOTOMIES L5,S1 DISCECTOMY L5,S1;  Surgeon: Susa Day, MD;  Location: [facility]S;  Service: Orthopedics;  Laterality: Right;  . GYNECOLOGIC CRYOSURGERY  1998  . Hysteroscopy, D&C, Novasure ablation, removal of Intrauterine device  01/08/2008  . LUMBAR LAMINECTOMY/DECOMPRESSION MICRODISCECTOMY Right 10/11/2016   Procedure: REVISION MICRO LUMBAR DECOMPRESSION L5-S1 RIGHT;  Surgeon: Susa Day, MD;  Location: [facility];  Service: Orthopedics;  Laterality: Right;  . NOVASURE ABLATION  01/08/2008    OB History    Gravida Para Term Preterm AB Living   3 1 1   2 1    SAB TAB Ectopic Multiple Live Births   2       1       Home Medications    Prior to Admission medications   Medication Sig Start Date End Date  Taking? Authorizing Provider  acetaminophen (TYLENOL) 500 MG tablet Take 1,000 mg by mouth every 6 (six) hours as needed for moderate pain or headache.   Yes [provider]  insulin degludec (TRESIBA) 100 UNIT/ML SOPN FlexTouch Pen Inject 40 units SQ in the morning 05/27/17  Yes [provider]  insulin lispro (HUMALOG) 100 UNIT/ML injection Inject 20 Units into the skin daily before supper.    Yes [provider]  lisinopril (PRINIVIL,ZESTRIL) 5 MG tablet  Take 5 mg by mouth daily.   Yes [provider]  docusate sodium (COLACE) 100 MG capsule Take 1 capsule (100 mg total) by mouth 2 (two) times daily as needed for mild constipation. Patient not taking: Reported on 07/20/2017 10/11/16   Susa Day, MD  ibuprofen (ADVIL,MOTRIN) 200 MG tablet Take 4 tablets (800 mg total) by mouth every 6 (six) hours as needed (for pain.). May resume 5 days post-op Patient not taking: Reported on 07/20/2017 10/12/16   Cecilie Kicks, PA-C  insulin aspart (NOVOLOG) 100 UNIT/ML injection Inject 30 Units into the skin 2 (two) times daily. Patient not taking: Reported on 07/20/2017 10/12/16   Bonnielee Haff, MD  LEVEMIR FLEXTOUCH 100 UNIT/ML Pen Inject 46 Units into the skin every evening. Patient not taking: Reported on 07/20/2017 10/12/16   Bonnielee Haff, MD  methocarbamol (ROBAXIN) 500 MG tablet Take 1 tablet (500 mg total) by mouth every 6 (six) hours as needed for muscle spasms. Patient not taking: Reported on 07/20/2017 10/11/16   Susa Day, MD  oxyCODONE (OXY IR/ROXICODONE) 5 MG immediate release tablet Take 2 tablets (10 mg total) by mouth every 4 (four) hours as needed for severe pain. Patient not taking: Reported on 07/20/2017 10/11/16   Susa Day, MD  polyethylene glycol [facility] Floria Raveling) packet Take 17 g by mouth daily. Patient not taking: Reported on 07/20/2017 10/11/16   Susa Day, MD    Family History Family History  Problem Relation Age of Onset  . Heart disease Mother   . Diabetes Mother   . Hypertension Father   . Diabetes Father   . Kidney failure Father   . Stroke Father   . Hypertension Paternal Grandmother   . Diabetes Paternal Grandmother   . Stroke Paternal Grandmother   . Diabetes Maternal Grandmother   . Ovarian cancer Maternal Grandmother   . Diabetes Maternal Grandfather   . Diabetes Paternal Grandfather     Social History Social History  Substance Use Topics  . Smoking status: Never Smoker  .  Smokeless tobacco: Never Used  . Alcohol use No     Allergies   Patient has no known allergies.   Review of Systems Review of Systems  A complete review of systems was obtained and all systems are negative except as noted in the HPI and PMH.   Physical Exam Updated Vital Signs BP (!) 136/91   Pulse (!) 125   Temp 98.3 F (36.8 C)   Resp (!) 21   Ht 5\' 5"  (1.651 m)   Wt 109.8 kg (242 lb)   LMP 07/20/2017   SpO2 95%   BMI 40.27 kg/m   Physical Exam  Constitutional: She is oriented to person, place, and time. She appears well-developed and well-nourished. No distress.  Obese  HENT:  Head: Normocephalic.  Mouth/Throat: Oropharynx is clear and moist.  Eyes: Conjunctivae are normal.  Neck: Normal range of motion. No JVD present. No tracheal deviation present.  Cardiovascular: Normal rate, regular rhythm and intact distal pulses.  Tachycardic, regular  Radial pulse equal bilaterally, upper extremity is with equal blood pressures.  Pulmonary/Chest: Effort normal and breath sounds normal. No stridor. No respiratory distress. She has no wheezes. She has no rales. She exhibits no tenderness.  Abdominal: Soft. She exhibits no distension and no mass. There is no tenderness. There is no rebound and no guarding.  Musculoskeletal: Normal range of motion. She exhibits no edema or tenderness.  No calf asymmetry, superficial collaterals, palpable cords, edema, Homans sign negative bilaterally.    Neurological: She is alert and oriented to person, place, and time.  Skin: Skin is warm. She is not diaphoretic.  Psychiatric:  Agitated, hyperventilating  Nursing note and vitals reviewed.    ED Treatments / Results  Labs (all labs ordered are listed, but only abnormal results are displayed) Labs Reviewed  BASIC METABOLIC PANEL - Abnormal; Notable for the following:       Result Value   Glucose, Bld 343 (*)    Calcium 8.8 (*)    All other components within normal limits  CBC -  Abnormal; Notable for the following:    WBC 12.1 (*)    All other components within normal limits  I-STAT TROPONIN, ED - Abnormal; Notable for the following:    Troponin i, poc 0.79 (*)    All other components within normal limits  I-STAT CHEM 8, ED - Abnormal; Notable for the following:    Glucose, Bld 347 (*)    Calcium, Ion 1.03 (*)    All other components within normal limits  HEPARIN LEVEL (UNFRACTIONATED)  I-STAT BETA HCG BLOOD, ED ([facility], [facility], [facility] ONLY)    EKG  EKG Interpretation  Date/Time:  Sunday July 20 2017 10:29:28 EDT Ventricular Rate:  117 PR Interval:    QRS Duration: 77 QT Interval:  332 QTC Calculation: 464 R Axis:   82 Text Interpretation:  Sinus tachycardia Borderline repolarization abnormality sinus tachycardia new since previous Confirmed by Wandra Arthurs [phone]) on 07/20/2017 10:36:46 AM       Radiology Dg Chest 2 View  Result Date: 07/20/2017 CLINICAL DATA:  Left mid scratched at left and mid chest pain EXAM: CHEST  2 VIEW COMPARISON:  06/22/2011 FINDINGS: Low lung volumes which accentuate heart size and vascular markings. Vascular congestion. Bibasilar atelectasis. No effusions or acute bony abnormality. IMPRESSION: Low lung volumes, vascular congestion and bibasilar atelectasis. Electronically Signed   By: Rolm Baptise M.D.   On: 07/20/2017 11:24   Ct Angio Chest/abd/pel For Dissection W And/or Wo Contrast  Result Date: 07/20/2017 CLINICAL DATA:  Chest pain, shortness of breath EXAM: CT ANGIOGRAPHY CHEST, ABDOMEN AND PELVIS TECHNIQUE: Multidetector CT imaging through the chest, abdomen and pelvis was performed using the standard protocol during bolus administration of intravenous contrast. Multiplanar reconstructed images and MIPs were obtained and reviewed to evaluate the vascular anatomy. CONTRAST:  100 cc Isovue 370 IV COMPARISON:  None. FINDINGS: CTA CHEST FINDINGS Cardiovascular: Heart is normal size. Aorta is normal caliber. No evidence of aortic  dissection. No filling defects in the pulmonary artery is to suggest pulmonary emboli. Mediastinum/Nodes: No mediastinal, hilar, or axillary adenopathy. Soft tissue in the anterior mediastinum felt represent residual thymus. Lungs/Pleura: Trace bilateral pleural effusions. Bilateral ground-glass airspace opacities, most pronounced in the lower lobes. Given the normal heart size, favor this represents inflammation or infection/pneumonia. Musculoskeletal: No acute bony abnormality. Review of the MIP images confirms the above findings. CTA ABDOMEN AND PELVIS FINDINGS VASCULAR Aorta: Aorta is normal caliber.  No dissection. Celiac:  Widely patent SMA: Widely patent Renals: Widely patent IMA: Widely patent Inflow: Widely patent.  No aneurysm or dissection. Veins:  Grossly unremarkable Review of the MIP images confirms the above findings. NON-VASCULAR Hepatobiliary: No focal hepatic abnormality.  Prior cholecystectomy Pancreas: No focal abnormality or ductal dilatation. Spleen: No focal abnormality.  Normal size. Adrenals/Urinary Tract: No adrenal abnormality. No focal renal abnormality. No stones or hydronephrosis. Urinary bladder is unremarkable. Stomach/Bowel: Stomach, large and small bowel grossly unremarkable. Appendix is normal. Lymphatic: No adenopathy. Reproductive: Large calcified central uterine fibroid. No adnexal masses. Other: No free fluid or free air. Musculoskeletal: No acute bony abnormality. Degenerative changes in the lower lumbar spine. Sclerotic focus in the L4 vertebral body, likely bone island. Review of the MIP images confirms the above findings. IMPRESSION: No evidence of aortic aneurysm or dissection. No evidence of pulmonary embolus. Bilateral ground-glass airspace opacities, most pronounced in the lower lobes, likely inflammation/ infection. Trace bilateral effusions. Prior cholecystectomy. No acute findings in the abdomen or pelvis. Uterine fibroids. Electronically Signed   By: Rolm Baptise  M.D.   On: 07/20/2017 13:21    Procedures Procedures (including critical care time)  CRITICAL CARE Performed by: Monico Blitz   Total critical care time: 50  minutes  Critical care time was exclusive of separately billable procedures and treating other patients.  Critical care was necessary to treat or prevent imminent or life-threatening deterioration.  Critical care was time spent personally by me on the following activities: development of treatment plan with patient and/or surrogate as well as nursing, discussions with consultants, evaluation of patient's response to treatment, examination of patient, obtaining history from patient or surrogate, ordering and performing treatments and interventions, ordering and review of laboratory studies, ordering and review of radiographic studies, pulse oximetry and re-evaluation of patient's condition.   Medications Ordered in ED Medications  heparin bolus via infusion 4,000 Units (not administered)  heparin ADULT infusion 100 units/mL (25000 units/23mL sodium chloride 0.45%) (not administered)  LORazepam (ATIVAN) injection 1 mg (1 mg Intravenous Given 07/20/17 1102)  morphine 4 MG/ML injection 4 mg (4 mg Intravenous Given 07/20/17 1131)  ondansetron (ZOFRAN) injection 4 mg (4 mg Intravenous Given 07/20/17 1131)  nitroGLYCERIN 50 mg in dextrose 5 % 250 mL (0.2 mg/mL) infusion (40 mcg/min Intravenous Rate/Dose Change 07/20/17 1352)  LORazepam (ATIVAN) injection 1 mg (1 mg Intravenous Given 07/20/17 1143)  0.9 %  sodium chloride infusion ( Intravenous New Bag/Given 07/20/17 1153)  iopamidol (ISOVUE-370) 76 % injection (100 mLs  Contrast Given 07/20/17 1247)  sodium chloride 0.9 % bolus 1,000 mL (1,000 mLs Intravenous New Bag/Given 07/20/17 1228)     Initial Impression / Assessment and Plan / ED Course  I have reviewed the triage vital signs and the nursing notes.  Pertinent labs & imaging results that were available during my care of the  patient were reviewed by me and considered in my medical decision making (see chart for details).    Vitals:   07/20/17 1313 07/20/17 1315 07/20/17 1330 07/20/17 1345  BP: 133/87 119/78 123/82 (!) 136/91  Pulse: (!) 116 (!) 114 (!) 110 (!) 125  Resp: (!) 29 (!) 27 (!) 27 (!) 21  Temp:      SpO2: 100% 95% 99% 95%  Weight:      Height:        Medications  heparin bolus via infusion 4,000 Units (not administered)  heparin ADULT infusion 100 units/mL (25000 units/243mL sodium chloride 0.45%) (not administered)  LORazepam (ATIVAN) injection 1  mg (1 mg Intravenous Given 07/20/17 1102)  morphine 4 MG/ML injection 4 mg (4 mg Intravenous Given 07/20/17 1131)  ondansetron (ZOFRAN) injection 4 mg (4 mg Intravenous Given 07/20/17 1131)  nitroGLYCERIN 50 mg in dextrose 5 % 250 mL (0.2 mg/mL) infusion (40 mcg/min Intravenous Rate/Dose Change 07/20/17 1352)  LORazepam (ATIVAN) injection 1 mg (1 mg Intravenous Given 07/20/17 1143)  0.9 %  sodium chloride infusion ( Intravenous New Bag/Given 07/20/17 1153)  iopamidol (ISOVUE-370) 76 % injection (100 mLs  Contrast Given 07/20/17 1247)  sodium chloride 0.9 % bolus 1,000 mL (1,000 mLs Intravenous New Bag/Given 07/20/17 1228)    Erica Nguyen is 46 y.o. female presenting with Severe left-sided retrosternal nonradiating chest pain with associated shortness of breath and mild cough onset this a.m. EKG with sinus tachycardia, no ischemic changes. Patient is extremely agitated, Ativan given with some relief. Troponin has resulted at elevated at 0.79. Consider primary ACS versus heart strain secondary to pulmonary embolism versus dissection. I have evaluated the portable chest film with attending who feels that the mediastinum may be widened, will obtain dissection study.  Attending physician Dr. Darl Householder has performed bedside ultrasound, there is left ventricular dysfunction with no significant cardiac effusion. Patient reports improvement with morphine and nitroglycerin  drip. We have expedited her dissection study tachycardia with no other significant hemodynamic compromise.  Wet read by attending physician negative for overt dissection, heparin initiated.  No PE or dissection on formal read. Patient updated, extremely agitated, advised her to try to focus on her breathing and remain calm. Cardiology consult from Dr. Terrence Dupont Appreciated: He will come to evaluate and admit the patient. Updated the patient who confirms that her pain is 3 out of 10. Heart rate is 125 and pressures in the 120s.   Final Clinical Impressions(s) / ED Diagnoses   Final diagnoses:  NSTEMI (non-ST elevated myocardial infarction) [facility])    New Prescriptions New Prescriptions   No medications on file     Waynetta Pean 07/20/17 1358    Drenda Freeze, MD 07/20/17 1520

## 2017-07-20 NOTE — ED Notes (Signed)
Ems gave 324 asa

## 2017-07-20 NOTE — ED Notes (Signed)
Elmyra Ricks PA stated to stop titrating nitro drip.

## 2017-07-20 NOTE — Plan of Care (Signed)
Problem: Safety: Goal: Ability to remain free from injury will improve Outcome: Progressing Pt stated she felt dizzy when standing at the beginning of the shift. Pt was educated on using the call bell for assistance when getting out of bed. Pt has had no new injuries during this shift.

## 2017-07-20 NOTE — Progress Notes (Signed)
Page sent to on-call physician (5p-6a) that stated patients critical troponin of 0.030. Awaiting response.

## 2017-07-20 NOTE — ED Notes (Signed)
Pt to xray

## 2017-07-20 NOTE — ED Notes (Signed)
Per cardiology, to drop Nitro down to 20 mcg/min

## 2017-07-20 NOTE — Progress Notes (Signed)
[facility] for heparin Indication: ACS  No Known Allergies  Patient Measurements: Height: 5\' 7"  (170.2 cm) Weight: 260 lb 3 oz (118 kg) IBW/kg (Calculated) : 61.6 Heparin Dosing Weight: 82.8 kg  Assessment: 46 yo F presents on 7/29 with CP. No anticoag PTA.   Initial heparin low at 0.11 units/mL. No bleeding noted  Goal of Therapy:  Heparin level 0.3-0.7 units/ml Monitor platelets by anticoagulation protocol: Yes   Plan:  Give heparin 2000 unit bolus x1, then increase to 1200 units/hr Monitor daily heparin level, CBC, s/s of bleed  Joey Lierman D. Felma Pfefferle, PharmD, BCPS Clinical Pharmacist 07/20/2017 9:31 PM

## 2017-07-20 NOTE — H&P (Signed)
Erica Nguyen is an 46 y.o. female.   Chief Complaint: Left-sided chest pain associated with shortness of breath HPI: Patient is 46 year old female with past medical history significant for hypertension, insulin-requiring diabetes mellitus, morbid obesity, strong family history of coronary artery disease mother had MI in her 33s, came to the ER by EMS complaining of retrosternal and left-sided chest heaviness described as brick grade 9/10 associated shortness of breath which woke her up around 4 AM did not seek any medical attention but as patient is pain continued so decided to call EMS. Patient denies any nausea vomiting diaphoresis. Denies pain the orthopnea leg swelling. Denies palpitation lightheadedness or syncope. EKG done in ED showed sinus tachycardia with nonspecific ST-T wave changes and was noted to have minimally elevated troponin I. Patient also had CT angiography of the chest which showed no evidence of PE or dissection. Patient was started on IV heparin and nitroglycerin with improvement of chest pain to 2-3/10 now.  Past Medical History:  Diagnosis Date  . Abnormal Pap smear   . Diabetes mellitus    insulin-dependent type 2  . Family history of adverse reaction to anesthesia    sister has n/v  . Fibroid   . HNP (herniated nucleus pulposus)   . Hypertension    chronic hypertension    Past Surgical History:  Procedure Laterality Date  . CARDIAC CATHETERIZATION  02/29/2008   Normal coronary arteries -- Normal LV (left ventricular) systolic function -- Mild to moderate elevation in left ventricular end-diastolic  pressure secondary to hypertension, diabetes and obesity  . CHOLECYSTECTOMY  1995  . DECOMPRESSIVE LUMBAR LAMINECTOMY LEVEL 1 Right 11/22/2015   Procedure: DECOMPRESSIVE LUMBAR LAMINECTOMY L5-S1 ON RIGHT,FORAMINOTOMIES L5,S1 DISCECTOMY L5,S1;  Surgeon: Susa Day, MD;  Location: [facility];  Service: Orthopedics;  Laterality: Right;  . GYNECOLOGIC CRYOSURGERY  1998  .  Hysteroscopy, D&C, Novasure ablation, removal of Intrauterine device  01/08/2008  . LUMBAR LAMINECTOMY/DECOMPRESSION MICRODISCECTOMY Right 10/11/2016   Procedure: REVISION MICRO LUMBAR DECOMPRESSION L5-S1 RIGHT;  Surgeon: Susa Day, MD;  Location: [facility];  Service: Orthopedics;  Laterality: Right;  . [place] ABLATION  01/08/2008    Family History  Problem Relation Age of Onset  . Heart disease Mother   . Diabetes Mother   . Hypertension Father   . Diabetes Father   . Kidney failure Father   . Stroke Father   . Hypertension Paternal Grandmother   . Diabetes Paternal Grandmother   . Stroke Paternal Grandmother   . Diabetes Maternal Grandmother   . Ovarian cancer Maternal Grandmother   . Diabetes Maternal Grandfather   . Diabetes Paternal Grandfather    Social History:  reports that she has never smoked. She has never used smokeless tobacco. She reports that she does not drink alcohol or use drugs.  Allergies: No Known Allergies   (Not in a hospital admission)  Results for orders placed or performed during the hospital encounter of 07/20/17 (from the past 48 hour(s))  Basic metabolic panel     Status: Abnormal   Collection Time: 07/20/17 10:56 AM  Result Value Ref Range   Sodium 138 135 - 145 mmol/L   Potassium 4.0 3.5 - 5.1 mmol/L    Comment: HEMOLYSIS AT THIS LEVEL MAY AFFECT RESULT   Chloride 103 101 - 111 mmol/L   CO2 23 22 - 32 mmol/L   Glucose, Bld 343 (H) 65 - 99 mg/dL   BUN 12 6 - 20 mg/dL   Creatinine, Ser 0.66 0.44 - 1.00  mg/dL   Calcium 8.8 (L) 8.9 - 10.3 mg/dL   GFR calc non Af Amer >60 >60 mL/min   GFR calc Af Amer >60 >60 mL/min    Comment: (NOTE) The eGFR has been calculated using the CKD EPI equation. This calculation has not been validated in all clinical situations. eGFR's persistently <60 mL/min signify possible Chronic Kidney Disease.    Anion gap 12 5 - 15  CBC     Status: Abnormal   Collection Time: 07/20/17 10:56 AM  Result Value Ref Range    WBC 12.1 (H) 4.0 - 10.5 K/uL   RBC 4.45 3.87 - 5.11 MIL/uL   Hemoglobin 12.6 12.0 - 15.0 g/dL   HCT 37.6 36.0 - 46.0 %   MCV 84.5 78.0 - 100.0 fL   MCH 28.3 26.0 - 34.0 pg   MCHC 33.5 30.0 - 36.0 g/dL   RDW 13.7 11.5 - 15.5 %   Platelets 327 150 - 400 K/uL  I-stat troponin, ED     Status: Abnormal   Collection Time: 07/20/17 11:03 AM  Result Value Ref Range   Troponin i, poc 0.79 (HH) 0.00 - 0.08 ng/mL   Comment NOTIFIED PHYSICIAN    Comment 3            Comment: Due to the release kinetics of cTnI, a negative result within the first hours of the onset of symptoms does not rule out myocardial infarction with certainty. If myocardial infarction is still suspected, repeat the test at appropriate intervals.   I-Stat Chem 8, ED     Status: Abnormal   Collection Time: 07/20/17 11:26 AM  Result Value Ref Range   Sodium 138 135 - 145 mmol/L   Potassium 3.7 3.5 - 5.1 mmol/L   Chloride 102 101 - 111 mmol/L   BUN 14 6 - 20 mg/dL   Creatinine, Ser 0.50 0.44 - 1.00 mg/dL   Glucose, Bld 347 (H) 65 - 99 mg/dL   Calcium, Ion 1.03 (L) 1.15 - 1.40 mmol/L   TCO2 26 0 - 100 mmol/L   Hemoglobin 12.2 12.0 - 15.0 g/dL   HCT 36.0 36.0 - 46.0 %  I-Stat Beta hCG blood, ED ([facility], [facility], [facility] only)     Status: None   Collection Time: 07/20/17 11:50 AM  Result Value Ref Range   I-stat hCG, quantitative <5.0 <5 mIU/mL   Comment 3            Comment:   GEST. AGE      CONC.  (mIU/mL)   <=1 WEEK        5 - 50     2 WEEKS       50 - 500     3 WEEKS       100 - 10,000     4 WEEKS     1,000 - 30,000        FEMALE AND NON-PREGNANT FEMALE:     LESS THAN 5 mIU/mL    Dg Chest 2 View  Result Date: 07/20/2017 CLINICAL DATA:  Left mid scratched at left and mid chest pain EXAM: CHEST  2 VIEW COMPARISON:  06/22/2011 FINDINGS: Low lung volumes which accentuate heart size and vascular markings. Vascular congestion. Bibasilar atelectasis. No effusions or acute bony abnormality. IMPRESSION: Low lung volumes, vascular  congestion and bibasilar atelectasis. Electronically Signed   By: Rolm Baptise M.D.   On: 07/20/2017 11:24   Ct Angio Chest/abd/pel For Dissection W And/or Wo Contrast  Result Date: 07/20/2017  CLINICAL DATA:  Chest pain, shortness of breath EXAM: CT ANGIOGRAPHY CHEST, ABDOMEN AND PELVIS TECHNIQUE: Multidetector CT imaging through the chest, abdomen and pelvis was performed using the standard protocol during bolus administration of intravenous contrast. Multiplanar reconstructed images and MIPs were obtained and reviewed to evaluate the vascular anatomy. CONTRAST:  100 cc Isovue 370 IV COMPARISON:  None. FINDINGS: CTA CHEST FINDINGS Cardiovascular: Heart is normal size. Aorta is normal caliber. No evidence of aortic dissection. No filling defects in the pulmonary artery is to suggest pulmonary emboli. Mediastinum/Nodes: No mediastinal, hilar, or axillary adenopathy. Soft tissue in the anterior mediastinum felt represent residual thymus. Lungs/Pleura: Trace bilateral pleural effusions. Bilateral ground-glass airspace opacities, most pronounced in the lower lobes. Given the normal heart size, favor this represents inflammation or infection/pneumonia. Musculoskeletal: No acute bony abnormality. Review of the MIP images confirms the above findings. CTA ABDOMEN AND PELVIS FINDINGS VASCULAR Aorta: Aorta is normal caliber.  No dissection. Celiac: Widely patent SMA: Widely patent Renals: Widely patent IMA: Widely patent Inflow: Widely patent.  No aneurysm or dissection. Veins:  Grossly unremarkable Review of the MIP images confirms the above findings. NON-VASCULAR Hepatobiliary: No focal hepatic abnormality.  Prior cholecystectomy Pancreas: No focal abnormality or ductal dilatation. Spleen: No focal abnormality.  Normal size. Adrenals/Urinary Tract: No adrenal abnormality. No focal renal abnormality. No stones or hydronephrosis. Urinary bladder is unremarkable. Stomach/Bowel: Stomach, large and small bowel grossly  unremarkable. Appendix is normal. Lymphatic: No adenopathy. Reproductive: Large calcified central uterine fibroid. No adnexal masses. Other: No free fluid or free air. Musculoskeletal: No acute bony abnormality. Degenerative changes in the lower lumbar spine. Sclerotic focus in the L4 vertebral body, likely bone island. Review of the MIP images confirms the above findings. IMPRESSION: No evidence of aortic aneurysm or dissection. No evidence of pulmonary embolus. Bilateral ground-glass airspace opacities, most pronounced in the lower lobes, likely inflammation/ infection. Trace bilateral effusions. Prior cholecystectomy. No acute findings in the abdomen or pelvis. Uterine fibroids. Electronically Signed   By: Rolm Baptise M.D.   On: 07/20/2017 13:21    Review of Systems  Constitutional: Negative for chills, diaphoresis and fever.  Eyes: Negative for blurred vision.  Respiratory: Positive for shortness of breath.   Cardiovascular: Positive for chest pain. Negative for orthopnea, claudication and leg swelling.  Gastrointestinal: Negative for nausea and vomiting.  Genitourinary: Negative for dysuria.  Neurological: Negative for dizziness.    Blood pressure 130/85, pulse (!) 117, temperature 98.3 F (36.8 C), resp. rate (!) 31, height '5\' 5"'  (1.651 m), weight 109.8 kg (242 lb), last menstrual period 07/20/2017, SpO2 98 %. Physical Exam  Constitutional: She is oriented to person, place, and time.  HENT:  Head: Normocephalic and atraumatic.  Eyes: Pupils are equal, round, and reactive to light. Conjunctivae are normal.  Neck: Normal range of motion. Neck supple. No JVD present. No tracheal deviation present. No thyromegaly present.  Cardiovascular: Exam reveals gallop.   Murmur heard. Tachycardic S1 and S2 soft there is soft systolic murmur and S3 gallop noted  Respiratory:  Decreased breath sound at bases  GI: Soft. Bowel sounds are normal. She exhibits no distension. There is no tenderness.  There is no rebound.  Musculoskeletal: She exhibits no edema, tenderness or deformity.  Neurological: She is alert and oriented to person, place, and time.     Assessment/Plan Probable small acute non-Q-wave myocardial infarction Hypertension Uncontrolled diabetes mellitus Morbid obesity Positive family history of coronary artery disease Plan As per orders Discussed with patient and family  regarding left cardiac catheterization possible PTCA stenting its risk and benefits i.e. death MI stroke need for emergency CABG local vascular complications etc. and consents for PCI. Also discussed with patient regarding radial versus femoral approach its risk and benefits and agrees to proceed with femoral approach.  [name], MD 07/20/2017, 2:41 PM

## 2017-07-20 NOTE — Progress Notes (Signed)
Spoke with patient's care nurse re: positive troponin. Patient is on heparin, ASA, and plavix currently. Chest pain 4/10, improving from presentation. On low dose nitro drip. Will be increasing nitro gtt until chest pain is resolved. NPOpMN for cath tomorrow.  Marcie Mowers, MD Cardiology fellow

## 2017-07-20 NOTE — ED Notes (Signed)
CT notified to come pick up patient.

## 2017-07-21 ENCOUNTER — Encounter (HOSPITAL_COMMUNITY): Payer: Self-pay | Admitting: Cardiology

## 2017-07-21 ENCOUNTER — Inpatient Hospital Stay (HOSPITAL_COMMUNITY)
Admission: EM | Disposition: A | Discharge status: Home / Self Care | Payer: Self-pay | Source: Home / Self Care | Attending: Cardiology

## 2017-07-21 HISTORY — PX: CORONARY STENT INTERVENTION: CATH118234

## 2017-07-21 HISTORY — PX: LEFT HEART CATH AND CORONARY ANGIOGRAPHY: CATH118249

## 2017-07-21 LAB — CBC
HCT: 33.6 % — ABNORMAL LOW (ref 36.0–46.0)
Hemoglobin: 11.1 g/dL — ABNORMAL LOW (ref 12.0–15.0)
MCH: 28 pg (ref 26.0–34.0)
MCHC: 33 g/dL (ref 30.0–36.0)
MCV: 84.6 fL (ref 78.0–100.0)
PLATELETS: 319 10*3/uL (ref 150–400)
RBC: 3.97 MIL/uL (ref 3.87–5.11)
RDW: 13.9 % (ref 11.5–15.5)
WBC: 13.2 10*3/uL — ABNORMAL HIGH (ref 4.0–10.5)

## 2017-07-21 LAB — BASIC METABOLIC PANEL
ANION GAP: 7 (ref 5–15)
BUN: 9 mg/dL (ref 6–20)
CO2: 25 mmol/L (ref 22–32)
Calcium: 8 mg/dL — ABNORMAL LOW (ref 8.9–10.3)
Chloride: 103 mmol/L (ref 101–111)
Creatinine, Ser: 0.66 mg/dL (ref 0.44–1.00)
GFR calc Af Amer: >60 mL/min (ref >60)
GFR calc non Af Amer: >60 mL/min (ref >60)
Glucose, Bld: 325 mg/dL — ABNORMAL HIGH (ref 65–99)
Potassium: 3.5 mmol/L (ref 3.5–5.1)
Sodium: 135 mmol/L (ref 135–145)

## 2017-07-21 LAB — HEMOGLOBIN A1C
HEMOGLOBIN A1C: 10.8 % — AB (ref 4.8–5.6)
Mean Plasma Glucose: 263 mg/dL

## 2017-07-21 LAB — POCT ACTIVATED CLOTTING TIME: Activated Clotting Time: 356 seconds

## 2017-07-21 LAB — GLUCOSE, CAPILLARY
GLUCOSE-CAPILLARY: 296 mg/dL — AB (ref 65–99)
GLUCOSE-CAPILLARY: 246 mg/dL — AB (ref 65–99)
Glucose-Capillary: 278 mg/dL — ABNORMAL HIGH (ref 65–99)
Glucose-Capillary: 292 mg/dL — ABNORMAL HIGH (ref 65–99)

## 2017-07-21 LAB — TROPONIN I
TROPONIN I: 0.3 ng/mL — AB (ref <0.03)
TROPONIN I: 0.17 ng/mL — AB (ref <0.03)

## 2017-07-21 LAB — HEPARIN LEVEL (UNFRACTIONATED): HEPARIN UNFRACTIONATED: 0.27 [IU]/mL — AB (ref 0.30–0.70)

## 2017-07-21 LAB — HIV ANTIBODY (ROUTINE TESTING W REFLEX): HIV Screen 4th Generation wRfx: NONREACTIVE

## 2017-07-21 SURGERY — LEFT HEART CATH AND CORONARY ANGIOGRAPHY
Anesthesia: LOCAL

## 2017-07-21 MED ORDER — LIVING WELL WITH DIABETES BOOK
Freq: Once | Status: AC
  Administered 2017-07-21: 21:00:00
  Filled 2017-07-21: qty 1

## 2017-07-21 MED ORDER — INSULIN ASPART 100 UNIT/ML ~~LOC~~ SOLN
0.0000 [IU] | Freq: Every day | SUBCUTANEOUS | Status: DC
  Administered 2017-07-21 – 2017-07-22 (×2): 3 [IU] via SUBCUTANEOUS

## 2017-07-21 MED ORDER — FUROSEMIDE 10 MG/ML IJ SOLN
INTRAMUSCULAR | Status: AC
  Filled 2017-07-21: qty 4

## 2017-07-21 MED ORDER — INSULIN GLARGINE 100 UNIT/ML ~~LOC~~ SOLN
25.0000 [IU] | Freq: Every day | SUBCUTANEOUS | Status: DC
  Administered 2017-07-21: 25 [IU] via SUBCUTANEOUS
  Filled 2017-07-21 (×2): qty 0.25

## 2017-07-21 MED ORDER — LIDOCAINE HCL (PF) 1 % IJ SOLN
INTRAMUSCULAR | Status: DC | PRN
  Administered 2017-07-21: 15 mL

## 2017-07-21 MED ORDER — INSULIN ASPART 100 UNIT/ML ~~LOC~~ SOLN
0.0000 [IU] | Freq: Three times a day (TID) | SUBCUTANEOUS | Status: DC
  Administered 2017-07-22: 7 [IU] via SUBCUTANEOUS
  Administered 2017-07-22 (×2): 5 [IU] via SUBCUTANEOUS
  Administered 2017-07-23: 3 [IU] via SUBCUTANEOUS
  Administered 2017-07-23: 2 [IU] via SUBCUTANEOUS

## 2017-07-21 MED ORDER — TICAGRELOR 90 MG PO TABS
ORAL_TABLET | ORAL | Status: AC
  Filled 2017-07-21: qty 2

## 2017-07-21 MED ORDER — SODIUM CHLORIDE 0.9 % IV SOLN
INTRAVENOUS | Status: AC | PRN
  Administered 2017-07-21 (×2): 1.75 mg/kg/h via INTRAVENOUS

## 2017-07-21 MED ORDER — HEPARIN (PORCINE) IN NACL 2-0.9 UNIT/ML-% IJ SOLN
INTRAMUSCULAR | Status: AC
  Filled 2017-07-21: qty 1000

## 2017-07-21 MED ORDER — IOPAMIDOL (ISOVUE-370) INJECTION 76%
INTRAVENOUS | Status: AC
  Filled 2017-07-21: qty 100

## 2017-07-21 MED ORDER — NITROGLYCERIN 1 MG/10 ML FOR IR/CATH LAB
INTRA_ARTERIAL | Status: DC | PRN
  Administered 2017-07-21 (×3): 100 ug via INTRACORONARY

## 2017-07-21 MED ORDER — NITROGLYCERIN 1 MG/10 ML FOR IR/CATH LAB
INTRA_ARTERIAL | Status: AC
  Filled 2017-07-21: qty 10

## 2017-07-21 MED ORDER — MAGNESIUM SULFATE 2 GM/50ML IV SOLN
2.0000 g | Freq: Once | INTRAVENOUS | Status: AC
  Administered 2017-07-21: 2 g via INTRAVENOUS
  Filled 2017-07-21: qty 50

## 2017-07-21 MED ORDER — SODIUM CHLORIDE 0.9 % IV SOLN
INTRAVENOUS | Status: DC
  Administered 2017-07-21: 07:00:00 via INTRAVENOUS

## 2017-07-21 MED ORDER — HEPARIN (PORCINE) IN NACL 2-0.9 UNIT/ML-% IJ SOLN
INTRAMUSCULAR | Status: AC | PRN
Start: 2017-07-21 — End: 2017-07-21
  Administered 2017-07-21: 1000 mL

## 2017-07-21 MED ORDER — BIVALIRUDIN TRIFLUOROACETATE 250 MG IV SOLR
INTRAVENOUS | Status: AC
  Filled 2017-07-21: qty 250

## 2017-07-21 MED ORDER — LIDOCAINE HCL (PF) 1 % IJ SOLN
INTRAMUSCULAR | Status: AC
  Filled 2017-07-21: qty 30

## 2017-07-21 MED ORDER — SODIUM CHLORIDE 0.9% FLUSH
3.0000 mL | Freq: Two times a day (BID) | INTRAVENOUS | Status: DC
  Administered 2017-07-22 (×2): 3 mL via INTRAVENOUS

## 2017-07-21 MED ORDER — TICAGRELOR 90 MG PO TABS
ORAL_TABLET | ORAL | Status: DC | PRN
  Administered 2017-07-21: 180 mg via ORAL

## 2017-07-21 MED ORDER — IOPAMIDOL (ISOVUE-370) INJECTION 76%
INTRAVENOUS | Status: AC
  Filled 2017-07-21: qty 50

## 2017-07-21 MED ORDER — FUROSEMIDE 10 MG/ML IJ SOLN
40.0000 mg | Freq: Once | INTRAMUSCULAR | Status: AC
  Administered 2017-07-21: 40 mg via INTRAVENOUS
  Filled 2017-07-21: qty 4

## 2017-07-21 MED ORDER — ACTIVE PARTNERSHIP FOR HEALTH OF YOUR HEART BOOK
Freq: Once | Status: AC
  Administered 2017-07-21: 21:00:00
  Filled 2017-07-21: qty 1

## 2017-07-21 MED ORDER — HYDROCODONE-ACETAMINOPHEN 5-325 MG PO TABS
1.0000 | ORAL_TABLET | Freq: Three times a day (TID) | ORAL | Status: DC | PRN
  Administered 2017-07-21 – 2017-07-23 (×5): 1 via ORAL
  Filled 2017-07-21 (×5): qty 1

## 2017-07-21 MED ORDER — MIDAZOLAM HCL 2 MG/2ML IJ SOLN
INTRAMUSCULAR | Status: DC | PRN
  Administered 2017-07-21: 0.5 mg via INTRAVENOUS

## 2017-07-21 MED ORDER — IOPAMIDOL (ISOVUE-370) INJECTION 76%
INTRAVENOUS | Status: AC
  Filled 2017-07-21: qty 125

## 2017-07-21 MED ORDER — SODIUM CHLORIDE 0.9 % IV SOLN: 250.0000 mL | INTRAVENOUS | Status: DC | PRN

## 2017-07-21 MED ORDER — BIVALIRUDIN BOLUS VIA INFUSION - CUPID
INTRAVENOUS | Status: DC | PRN
  Administered 2017-07-21: 87.825 mg via INTRAVENOUS

## 2017-07-21 MED ORDER — IOPAMIDOL (ISOVUE-370) INJECTION 76%
INTRAVENOUS | Status: DC | PRN
  Administered 2017-07-21: 195 mL via INTRA_ARTERIAL

## 2017-07-21 MED ORDER — ASPIRIN 81 MG PO CHEW: 81.0000 mg | CHEWABLE_TABLET | Freq: Every day | ORAL | Status: DC

## 2017-07-21 MED ORDER — GUAIFENESIN-CODEINE 100-10 MG/5ML PO SOLN: 5.0000 mL | Freq: Once | ORAL | Status: DC | PRN

## 2017-07-21 MED ORDER — FENTANYL CITRATE (PF) 100 MCG/2ML IJ SOLN
INTRAMUSCULAR | Status: AC
  Filled 2017-07-21: qty 2

## 2017-07-21 MED ORDER — THE SENSUOUS HEART BOOK
Freq: Once | Status: AC
  Administered 2017-07-21: 21:00:00
  Filled 2017-07-21: qty 1

## 2017-07-21 MED ORDER — MIDAZOLAM HCL 2 MG/2ML IJ SOLN
INTRAMUSCULAR | Status: AC
  Filled 2017-07-21: qty 2

## 2017-07-21 MED ORDER — MORPHINE SULFATE (PF) 4 MG/ML IV SOLN: 2.0000 mg | INTRAVENOUS | Status: DC | PRN

## 2017-07-21 MED ORDER — TICAGRELOR 90 MG PO TABS
90.0000 mg | ORAL_TABLET | Freq: Two times a day (BID) | ORAL | Status: DC
  Administered 2017-07-21 – 2017-07-23 (×4): 90 mg via ORAL
  Filled 2017-07-21 (×4): qty 1

## 2017-07-21 MED ORDER — FUROSEMIDE 10 MG/ML IJ SOLN
INTRAMUSCULAR | Status: DC | PRN
  Administered 2017-07-21: 40 mg via INTRAVENOUS

## 2017-07-21 MED ORDER — ANGIOPLASTY BOOK
Freq: Once | Status: AC
  Administered 2017-07-21: 21:00:00
  Filled 2017-07-21: qty 1

## 2017-07-21 MED ORDER — BENZONATATE 100 MG PO CAPS
200.0000 mg | ORAL_CAPSULE | Freq: Three times a day (TID) | ORAL | Status: DC | PRN
  Administered 2017-07-21: 200 mg via ORAL
  Filled 2017-07-21: qty 2

## 2017-07-21 MED ORDER — ORAL CARE MOUTH RINSE
15.0000 mL | Freq: Two times a day (BID) | OROMUCOSAL | Status: DC
  Administered 2017-07-21 – 2017-07-22 (×2): 15 mL via OROMUCOSAL

## 2017-07-21 MED ORDER — SODIUM CHLORIDE 0.9% FLUSH: 3.0000 mL | INTRAVENOUS | Status: DC | PRN

## 2017-07-21 MED ORDER — NITROGLYCERIN IN D5W 200-5 MCG/ML-% IV SOLN: 5.0000 ug/min | INTRAVENOUS | Status: DC

## 2017-07-21 MED ORDER — GUAIFENESIN-CODEINE 100-10 MG/5ML PO SOLN: 10.0000 mL | Freq: Once | ORAL | Status: DC | PRN

## 2017-07-21 MED ORDER — HEART ATTACK BOUNCING BOOK
Freq: Once | Status: AC
  Administered 2017-07-21: 21:00:00
  Filled 2017-07-21: qty 1

## 2017-07-21 SURGICAL SUPPLY — 23 items
BALLN EMERGE MR 2.5X8 (BALLOONS) ×2
BALLN EMERGE MR 3.0X12 (BALLOONS) ×2
BALLN ~~LOC~~ EMERGE MR 2.75X12 (BALLOONS) ×2
BALLN ~~LOC~~ EMERGE MR 3.75X15 (BALLOONS) ×2
BALLOON EMERGE MR 2.5X8 (BALLOONS) ×1 IMPLANT
BALLOON EMERGE MR 3.0X12 (BALLOONS) ×1 IMPLANT
BALLOON ~~LOC~~ EMERGE MR 2.75X12 (BALLOONS) ×1 IMPLANT
BALLOON ~~LOC~~ EMERGE MR 3.75X15 (BALLOONS) ×1 IMPLANT
CATH INFINITI 5FR MULTPACK ANG (CATHETERS) ×2 IMPLANT
CATH VISTA GUIDE 6FR JR4 (CATHETERS) ×2 IMPLANT
CATH VISTA GUIDE 6FR XBLAD3.0 (CATHETERS) ×2 IMPLANT
KIT ENCORE 26 ADVANTAGE (KITS) ×2 IMPLANT
KIT HEART LEFT (KITS) ×2 IMPLANT
PACK CARDIAC CATHETERIZATION (CUSTOM PROCEDURE TRAY) ×2 IMPLANT
SHEATH PINNACLE 5F 10CM (SHEATH) ×2 IMPLANT
SHEATH PINNACLE 6F 10CM (SHEATH) ×2 IMPLANT
STENT XIENCE ALPINE RX 2.5X8 (Permanent Stent) ×2 IMPLANT
STENT XIENCE ALPINE RX 2.75X15 (Permanent Stent) ×2 IMPLANT
STENT XIENCE ALPINE RX 3.5X18 (Permanent Stent) ×2 IMPLANT
SYR MEDRAD MARK V 150ML (SYRINGE) ×2 IMPLANT
TRANSDUCER W/STOPCOCK (MISCELLANEOUS) ×2 IMPLANT
WIRE EMERALD 3MM-J .035X150CM (WIRE) ×2 IMPLANT
WIRE HI TORQ BMW 190CM (WIRE) ×2 IMPLANT

## 2017-07-21 NOTE — Progress Notes (Signed)
Results for DAFINA, SUK (MRN [id number]) as of 07/21/2017 14:38  Ref. Range 10/12/2016 11:41 07/20/2017 16:28 07/20/2017 21:34 07/21/2017 07:44 07/21/2017 11:30  Glucose-Capillary Latest Ref Range: 65 - 99 mg/dL 95 257 (H) 290 (H) 296 (H) 246 (H)  Noted that blood sugars have been greater than 180 mg/dl. Recommend that Lantus dosage be increased to 30 units daily, and increase Novolog correction scale to MODERATE TID & HS. Will continue to monitor blood sugars while in the hospital.   Harvel Ricks RN BSN CDE Diabetes Coordinator Pager: [phone]  8am-5pm

## 2017-07-21 NOTE — Progress Notes (Signed)
ANTICOAGULATION CONSULT NOTE - Follow Up Consult  Pharmacy Consult for heparin Indication: chest pain/ACS  No Known Allergies   Labs:  Recent Labs  07/20/17 1056 07/20/17 1126 07/20/17 1640 07/20/17 2053 07/20/17 2146 07/21/17 0357  HGB 12.6 12.2  --   --   --  11.1*  HCT 37.6 36.0  --   --   --  33.6*  PLT 327  --   --   --   --  319  APTT  --   --  49*  --   --   --   LABPROT  --   --  13.2  --   --   --   INR  --   --  1.00  --   --   --   HEPARINUNFRC  --   --   --  0.11*  --  0.27*  CREATININE 0.66 0.50  --   --   --   --   TROPONINI  --   --  0.30*  --  0.20*  --     Estimated Creatinine Clearance: 118.7 mL/min (by C-G formula based on SCr of 0.5 mg/dL).   Assessment: 46yo female on heparin, remains below goal after rate adjustment & bolus last PM.  No bleeding noted.  Goal of Therapy:  Heparin level 0.3-0.7 units/ml Monitor platelets by anticoagulation protocol: Yes   Plan:  Increase heparin to 1300 units/hr Repeat heparin level 6hr   Gracy Bruins, B.S., PharmD [facility]

## 2017-07-21 NOTE — Interval H&P Note (Signed)
Cath Lab Visit (complete for each Cath Lab visit)  Clinical Evaluation Leading to the Procedure:   ACS: Yes.    Non-ACS:    Anginal Classification: CCS IV  Anti-ischemic medical therapy: Maximal Therapy (2 or more classes of medications)  Non-Invasive Test Results: No non-invasive testing performed  Prior CABG: No previous CABG      History and Physical Interval Note:  07/21/2017 8:39 AM  Erica Nguyen  has presented today for surgery, with the diagnosis of unstable angina  The various methods of treatment have been discussed with the patient and family. After consideration of risks, benefits and other options for treatment, the patient has consented to  Procedure(s): Left Heart Cath and Coronary Angiography (N/A) as a surgical intervention .  The patient's history has been reviewed, patient examined, no change in status, stable for surgery.  I have reviewed the patient's chart and labs.  Questions were answered to the patient's satisfaction.     Erica Nguyen

## 2017-07-21 NOTE — Plan of Care (Signed)
Problem: Education: Goal: Understanding of CV disease, CV risk reduction, and recovery process will improve Outcome: Progressing Pt educated on the purpose of the heparin drip, pt educated on the purpose of the cardiac cath procedure and possible interventions. Pt's questions regarding procedure were clarified. Pt was receptive to education. Pt declined watching the cath video during this shift.

## 2017-07-21 NOTE — Progress Notes (Signed)
Pt c/o of a non-productive cough. RN administered Robitussin cough syrup with no relief. RN assessed lung sounds, pt lungs noted to have wheezing. Pt Magnesium noted to be low at 1.6. MD notified and ordered 40mg  IV lasix and 2 grams IV magnesium. Will continue to monitor

## 2017-07-21 NOTE — Plan of Care (Deleted)
Problem: Education: Goal: Understanding of CV disease, CV risk reduction, and recovery process will improve Outcome: Progressing Pt educated on the purpose of the heparin drip, pt educated on the purpose of the cardiac cath procedure and possible interventions. Pt's questions regarding procedure were clarified. Pt was receptive to education. Pt declined watching the cath video during this shift.

## 2017-07-21 NOTE — Care Management Note (Signed)
Case Management Note  Patient Details  Name: [name] MRN: [id number] Date of Birth: 11/27/1971  Subjective/Objective:   From home, s/p  Coronary stent intervention, will be on brilinta. , NCM awaiting benefit check for brilinta.                Action/Plan: NCM will follow for dc needs.   Expected Discharge Date:                  Expected Discharge Plan:     In-House Referral:     Discharge planning Services  CM Consult  Post Acute Care Choice:    Choice offered to:     DME Arranged:    DME Agency:     HH Arranged:    HH Agency:     Status of Service:  In process, will continue to follow  If discussed at Long Length of Stay Meetings, dates discussed:    Additional Comments:  Zenon Mayo, RN 07/21/2017, 2:53 PM

## 2017-07-21 NOTE — Progress Notes (Signed)
Subjective:  Doing well denies any chest pain or shortness of breath. Tolerated PCI to RCA and LAD excellent angiographic results. Patient noted to have moderately depressed LV systolic function.  Objective:  Vital Signs in the last 24 hours: Temp:  [98 F (36.7 C)-98.2 F (36.8 C)] 98.1 F (36.7 C) (07/30 1355) Pulse Rate:  [91-116] 101 (07/30 1400) Resp:  [0-31] 21 (07/30 1400) BP: (90-157)/(63-100) 140/73 (07/30 1400) SpO2:  [80 %-99 %] 97 % (07/30 1400) Weight:  [117.1 kg (258 lb 3.2 oz)] 117.1 kg (258 lb 3.2 oz) (07/30 0340)  Intake/Output from previous day: 07/29 0701 - 07/30 0700 In: 1489.5 [P.O.:120; I.V.:369.5; IV Piggyback:1000] Out: 1950 [Urine:1950] Intake/Output from this shift: Total I/O In: 120 [P.O.:120] Out: -   Physical Exam: Neck: no adenopathy, no carotid bruit, no JVD and supple, symmetrical, trachea midline Lungs: Clear anterolaterally Heart: regular rate and rhythm, S1, S2 normal and Soft systolic murmur and S3 gallop noted Abdomen: soft, non-tender; bowel sounds normal; no masses,  no organomegaly Extremities: extremities normal, atraumatic, no cyanosis or edema and Right groin stable  Lab Results:  Recent Labs  07/20/17 1056 07/20/17 1126 07/21/17 0357  WBC 12.1*  --  13.2*  HGB 12.6 12.2 11.1*  PLT 327  --  319    Recent Labs  07/20/17 1056 07/20/17 1126 07/21/17 0630  NA 138 138 135  K 4.0 3.7 3.5  CL 103 102 103  CO2 23  --  25  GLUCOSE 343* 347* 325*  BUN 12 14 9   CREATININE 0.66 0.50 0.66    Recent Labs  07/20/17 2146 07/21/17 0357  TROPONINI 0.20* 0.17*   Hepatic Function Panel No results for input(s): PROT, ALBUMIN, AST, ALT, ALKPHOS, BILITOT, BILIDIR, IBILI in the last 72 hours. No results for input(s): CHOL in the last 72 hours. No results for input(s): PROTIME in the last 72 hours.  Imaging: Imaging results have been reviewed and Dg Chest 2 View  Result Date: 07/20/2017 CLINICAL DATA:  Left mid scratched at left  and mid chest pain EXAM: CHEST  2 VIEW COMPARISON:  06/22/2011 FINDINGS: Low lung volumes which accentuate heart size and vascular markings. Vascular congestion. Bibasilar atelectasis. No effusions or acute bony abnormality. IMPRESSION: Low lung volumes, vascular congestion and bibasilar atelectasis. Electronically Signed   By: Rolm Baptise M.D.   On: 07/20/2017 11:24   Ct Angio Chest/abd/pel For Dissection W And/or Wo Contrast  Result Date: 07/20/2017 CLINICAL DATA:  Chest pain, shortness of breath EXAM: CT ANGIOGRAPHY CHEST, ABDOMEN AND PELVIS TECHNIQUE: Multidetector CT imaging through the chest, abdomen and pelvis was performed using the standard protocol during bolus administration of intravenous contrast. Multiplanar reconstructed images and MIPs were obtained and reviewed to evaluate the vascular anatomy. CONTRAST:  100 cc Isovue 370 IV COMPARISON:  None. FINDINGS: CTA CHEST FINDINGS Cardiovascular: Heart is normal size. Aorta is normal caliber. No evidence of aortic dissection. No filling defects in the pulmonary artery is to suggest pulmonary emboli. Mediastinum/Nodes: No mediastinal, hilar, or axillary adenopathy. Soft tissue in the anterior mediastinum felt represent residual thymus. Lungs/Pleura: Trace bilateral pleural effusions. Bilateral ground-glass airspace opacities, most pronounced in the lower lobes. Given the normal heart size, favor this represents inflammation or infection/pneumonia. Musculoskeletal: No acute bony abnormality. Review of the MIP images confirms the above findings. CTA ABDOMEN AND PELVIS FINDINGS VASCULAR Aorta: Aorta is normal caliber.  No dissection. Celiac: Widely patent SMA: Widely patent Renals: Widely patent IMA: Widely patent Inflow: Widely patent.  No aneurysm  or dissection. Veins:  Grossly unremarkable Review of the MIP images confirms the above findings. NON-VASCULAR Hepatobiliary: No focal hepatic abnormality.  Prior cholecystectomy Pancreas: No focal abnormality  or ductal dilatation. Spleen: No focal abnormality.  Normal size. Adrenals/Urinary Tract: No adrenal abnormality. No focal renal abnormality. No stones or hydronephrosis. Urinary bladder is unremarkable. Stomach/Bowel: Stomach, large and small bowel grossly unremarkable. Appendix is normal. Lymphatic: No adenopathy. Reproductive: Large calcified central uterine fibroid. No adnexal masses. Other: No free fluid or free air. Musculoskeletal: No acute bony abnormality. Degenerative changes in the lower lumbar spine. Sclerotic focus in the L4 vertebral body, likely bone island. Review of the MIP images confirms the above findings. IMPRESSION: No evidence of aortic aneurysm or dissection. No evidence of pulmonary embolus. Bilateral ground-glass airspace opacities, most pronounced in the lower lobes, likely inflammation/ infection. Trace bilateral effusions. Prior cholecystectomy. No acute findings in the abdomen or pelvis. Uterine fibroids. Electronically Signed   By: Rolm Baptise M.D.   On: 07/20/2017 13:21    Cardiac Studies:  Assessment/Plan:  Acute small acute non-Q-wave myocardial infarction status post left cardiac catheterization/PTCA stenting to RCA and LAD Multivessel coronary artery disease Ischemic cardiomyopathy history Hypertension Uncontrolled diabetes mellitus Morbid obesity Positive family history of coronary artery disease Plan As per orders  LOS: 1 day    Erica Nguyen 07/21/2017, 5:48 PM

## 2017-07-22 LAB — BASIC METABOLIC PANEL
Anion gap: 7 (ref 5–15)
BUN: 11 mg/dL (ref 6–20)
CO2: 27 mmol/L (ref 22–32)
Calcium: 8.3 mg/dL — ABNORMAL LOW (ref 8.9–10.3)
Chloride: 101 mmol/L (ref 101–111)
Creatinine, Ser: 0.74 mg/dL (ref 0.44–1.00)
GFR calc Af Amer: >60 mL/min (ref >60)
GLUCOSE: 264 mg/dL — AB (ref 65–99)
POTASSIUM: 3.2 mmol/L — AB (ref 3.5–5.1)
Sodium: 135 mmol/L (ref 135–145)

## 2017-07-22 LAB — CBC
HEMATOCRIT: 32.7 % — AB (ref 36.0–46.0)
HEMOGLOBIN: 10.9 g/dL — AB (ref 12.0–15.0)
MCH: 28.4 pg (ref 26.0–34.0)
MCHC: 33.3 g/dL (ref 30.0–36.0)
MCV: 85.2 fL (ref 78.0–100.0)
Platelets: 302 10*3/uL (ref 150–400)
RBC: 3.84 MIL/uL — ABNORMAL LOW (ref 3.87–5.11)
RDW: 13.9 % (ref 11.5–15.5)
WBC: 14.6 10*3/uL — ABNORMAL HIGH (ref 4.0–10.5)

## 2017-07-22 LAB — GLUCOSE, CAPILLARY
GLUCOSE-CAPILLARY: 264 mg/dL — AB (ref 65–99)
GLUCOSE-CAPILLARY: 306 mg/dL — AB (ref 65–99)
GLUCOSE-CAPILLARY: 271 mg/dL — AB (ref 65–99)
Glucose-Capillary: 251 mg/dL — ABNORMAL HIGH (ref 65–99)

## 2017-07-22 MED ORDER — METOPROLOL TARTRATE 50 MG PO TABS
50.0000 mg | ORAL_TABLET | Freq: Two times a day (BID) | ORAL | Status: DC
  Administered 2017-07-22 – 2017-07-23 (×2): 50 mg via ORAL
  Filled 2017-07-22 (×2): qty 1

## 2017-07-22 MED ORDER — INSULIN GLARGINE 100 UNIT/ML ~~LOC~~ SOLN
30.0000 [IU] | Freq: Every day | SUBCUTANEOUS | Status: DC
  Administered 2017-07-22: 30 [IU] via SUBCUTANEOUS
  Filled 2017-07-22 (×3): qty 0.3

## 2017-07-22 MED ORDER — METOPROLOL TARTRATE 25 MG PO TABS
25.0000 mg | ORAL_TABLET | Freq: Once | ORAL | Status: AC
  Administered 2017-07-22: 12:00:00 25 mg via ORAL

## 2017-07-22 MED ORDER — POTASSIUM CHLORIDE CRYS ER 20 MEQ PO TBCR
40.0000 meq | EXTENDED_RELEASE_TABLET | Freq: Once | ORAL | Status: AC
  Administered 2017-07-22: 40 meq via ORAL
  Filled 2017-07-22: qty 2

## 2017-07-22 MED ORDER — RAMIPRIL 2.5 MG PO CAPS
2.5000 mg | ORAL_CAPSULE | Freq: Every day | ORAL | Status: DC
  Administered 2017-07-22 – 2017-07-23 (×2): 2.5 mg via ORAL
  Filled 2017-07-22 (×2): qty 1

## 2017-07-22 MED FILL — Fentanyl Citrate Preservative Free (PF) Inj 100 MCG/2ML: INTRAMUSCULAR | Qty: 2 | Status: AC

## 2017-07-22 NOTE — Progress Notes (Signed)
.   S/W TERRI @ [employer] [facility] # [phone]   BRILINTA 90 MG BID   COVER- YES  CO-PAY- $ 75.00  PRIOR APPROVAL- NO   ALTERNATIVE:   CLOPIDOGREL  75 MG BID  AND DAILY   COVER- YES  CO-PAY- $ 10.00  PRIOR APPROVAL- NO   PHARMACY : [facility]

## 2017-07-22 NOTE — Progress Notes (Signed)
Inpatient Diabetes Program Recommendations  AACE/ADA: New Consensus Statement on Inpatient Glycemic Control (2015)  Target Ranges:  Prepandial:   less than 140 mg/dL      Peak postprandial:   less than 180 mg/dL (1-2 hours)      Critically ill patients:  140 - 180 mg/dL   Lab Results  Component Value Date   GLUCAP 264 (H) 07/22/2017   HGBA1C 10.8 (H) 07/20/2017    Review of Glycemic Control Results for Erica Nguyen, Erica Nguyen (MRN [id number]) as of 07/22/2017 13:30  Ref. Range 07/21/2017 11:30 07/21/2017 19:30 07/21/2017 21:13 07/22/2017 06:31 07/22/2017 11:57  Glucose-Capillary Latest Ref Range: 65 - 99 mg/dL 246 (H) 278 (H) 292 (H) 251 (H) 264 (H)    Inpatient Diabetes Program Recommendations:   Met with patient @ bedside to discuss A1c 10.8(average BG 263 over the past 2-3 months time. Patient saw endocrinology group on 05/27/17 with A1c 9.6. Notes from office visit: "Start [place].  Take 66 units of this every day.  If your fasting blood sugars are still over 130 in 2 weeks, increase by 3 units.  Increase your [place] by 3 units every week until your fasting blood sugars are consistently < 130.  Start Humalog 20 units before breakfast and supper. " Discussed with patient and she has only been taking Tresiba 40 units daily and Humalog 20 units late pm or ac supper.Patient states her CBG drops post Humalog and explained Humalog is for meal coverage and [place] Aas is her basal insulin. Reviewed with patient and patient left message @ her endocrinologist's office to discuss medication dosages.  Please consider: -increase in Lantus to 50 units -add Novolog 8 units meal coverage tid if eats 50%.  Thank you, Nani Gasser. Meah Jiron, RN, MSN, CDE  Diabetes Coordinator Inpatient Glycemic Control Team Team Pager [phone] (8am-5pm) 07/22/2017 1:30 PM

## 2017-07-22 NOTE — Progress Notes (Signed)
CARDIAC REHAB PHASE I   PRE:  Rate/Rhythm: 104 ST    BP: sitting 144/85    SaO2:   MODE:  Ambulation: 260 ft   POST:  Rate/Rhythm: 123 ST    BP: sitting 152/82     SaO2:   Pt slow moving. Sts her back is sore from being in the bed. HR elevated with slow pace, short distance. C/o being fatigued after walk. Sts her HR was 160 on admit. Good, indepth education completed. Understands importance of Brilinta, DM control, stent, ex, NTG, and CRPII. Will refer to [place]. Needs Brilinta card. Long discussion of carb modification and more ex. Byng, ACSM 07/22/2017 9:27 AM

## 2017-07-22 NOTE — Progress Notes (Signed)
Subjective:  Patient denies any chest pain states feels better after PCI. States breathing has improved. Objective:  Vital Signs in the last 24 hours: Temp:  [97.7 F (36.5 C)-98.5 F (36.9 C)] 98.5 F (36.9 C) (07/31 0800) Pulse Rate:  [93-107] 93 (07/31 0800) Resp:  [20-29] 20 (07/31 0800) BP: (118-144)/(60-85) 144/85 (07/31 0800) SpO2:  [93 %-100 %] 93 % (07/31 0800) Weight:  [117 kg (257 lb 15 oz)] 117 kg (257 lb 15 oz) (07/31 0649)  Intake/Output from previous day: 07/30 0701 - 07/31 0700 In: 1180.9 [P.O.:1080; I.V.:100.9] Out: 1050 [Urine:1050] Intake/Output from this shift: No intake/output data recorded.  Physical Exam: Neck: no adenopathy, no carotid bruit, no JVD and supple, symmetrical, trachea midline Lungs: clear to auscultation bilaterally Heart: regular rate and rhythm, S1, S2 normal and Soft systolic murmur noted Abdomen: soft, non-tender; bowel sounds normal; no masses,  no organomegaly Extremities: extremities normal, atraumatic, no cyanosis or edema and Right groin stable no evidence of hematoma or ecchymosis.  Lab Results:  Recent Labs  07/21/17 0357 07/22/17 0134  WBC 13.2* 14.6*  HGB 11.1* 10.9*  PLT 319 302    Recent Labs  07/21/17 0630 07/22/17 0134  NA 135 135  K 3.5 3.2*  CL 103 101  CO2 25 27  GLUCOSE 325* 264*  BUN 9 11  CREATININE 0.66 0.74    Recent Labs  07/20/17 2146 07/21/17 0357  TROPONINI 0.20* 0.17*   Hepatic Function Panel No results for input(s): PROT, ALBUMIN, AST, ALT, ALKPHOS, BILITOT, BILIDIR, IBILI in the last 72 hours. No results for input(s): CHOL in the last 72 hours. No results for input(s): PROTIME in the last 72 hours.  Imaging: Imaging results have been reviewed and Ct Angio Chest/abd/pel For Dissection W And/or Wo Contrast  Result Date: 07/20/2017 CLINICAL DATA:  Chest pain, shortness of breath EXAM: CT ANGIOGRAPHY CHEST, ABDOMEN AND PELVIS TECHNIQUE: Multidetector CT imaging through the chest,  abdomen and pelvis was performed using the standard protocol during bolus administration of intravenous contrast. Multiplanar reconstructed images and MIPs were obtained and reviewed to evaluate the vascular anatomy. CONTRAST:  100 cc Isovue 370 IV COMPARISON:  None. FINDINGS: CTA CHEST FINDINGS Cardiovascular: Heart is normal size. Aorta is normal caliber. No evidence of aortic dissection. No filling defects in the pulmonary artery is to suggest pulmonary emboli. Mediastinum/Nodes: No mediastinal, hilar, or axillary adenopathy. Soft tissue in the anterior mediastinum felt represent residual thymus. Lungs/Pleura: Trace bilateral pleural effusions. Bilateral ground-glass airspace opacities, most pronounced in the lower lobes. Given the normal heart size, favor this represents inflammation or infection/pneumonia. Musculoskeletal: No acute bony abnormality. Review of the MIP images confirms the above findings. CTA ABDOMEN AND PELVIS FINDINGS VASCULAR Aorta: Aorta is normal caliber.  No dissection. Celiac: Widely patent SMA: Widely patent Renals: Widely patent IMA: Widely patent Inflow: Widely patent.  No aneurysm or dissection. Veins:  Grossly unremarkable Review of the MIP images confirms the above findings. NON-VASCULAR Hepatobiliary: No focal hepatic abnormality.  Prior cholecystectomy Pancreas: No focal abnormality or ductal dilatation. Spleen: No focal abnormality.  Normal size. Adrenals/Urinary Tract: No adrenal abnormality. No focal renal abnormality. No stones or hydronephrosis. Urinary bladder is unremarkable. Stomach/Bowel: Stomach, large and small bowel grossly unremarkable. Appendix is normal. Lymphatic: No adenopathy. Reproductive: Large calcified central uterine fibroid. No adnexal masses. Other: No free fluid or free air. Musculoskeletal: No acute bony abnormality. Degenerative changes in the lower lumbar spine. Sclerotic focus in the L4 vertebral body, likely bone island. Review of the  MIP images  confirms the above findings. IMPRESSION: No evidence of aortic aneurysm or dissection. No evidence of pulmonary embolus. Bilateral ground-glass airspace opacities, most pronounced in the lower lobes, likely inflammation/ infection. Trace bilateral effusions. Prior cholecystectomy. No acute findings in the abdomen or pelvis. Uterine fibroids. Electronically Signed   By: Rolm Baptise M.D.   On: 07/20/2017 13:21    Cardiac Studies:  Assessment/Plan:  Acute small acute non-Q-wave myocardial infarction status post left cardiac catheterization/PTCA stenting to RCA and LAD Multivessel coronary artery disease Ischemic cardiomyopathy  Hypertension Uncontrolled diabetes mellitus Morbid obesity Positive family history of coronary artery disease Plan Increase Lopressor to 50 mg twice daily Start low-dose ACE inhibitor Increase ambulation possible discharge tomorrow if stable  LOS: 2 days    [name] 07/22/2017, 12:20 PM

## 2017-07-22 NOTE — Care Management Note (Addendum)
Case Management Note  Patient Details  Name: Erica Nguyen MRN: [id number] Date of Birth: 1971/08/26  Subjective/Objective:    From home with 9- yo son, pta indep, s/p coronary stent intervention, will be on  Brilinta, NCM gave patient $5 co pay card, she will go to [facility] on [address] near [place].  She is for dc today. She states her deductible has not been met, she states she works for [employer], so she knows her deductible has not been met and she can not afford 75.00, NCM got samples for her from CHF clinic.                  Action/Plan:   Expected Discharge Date:                  Expected Discharge Plan:  Home/Self Care  In-House Referral:     Discharge planning Services  CM Consult  Post Acute Care Choice:    Choice offered to:     DME Arranged:    DME Agency:     HH Arranged:    HH Agency:     Status of Service:  Completed, signed off  If discussed at [employer] of Stay Meetings, dates discussed:    Additional Comments:  Zenon Mayo, RN 07/22/2017, 9:59 AM

## 2017-07-23 LAB — GLUCOSE, CAPILLARY
GLUCOSE-CAPILLARY: 203 mg/dL — AB (ref 65–99)
GLUCOSE-CAPILLARY: 159 mg/dL — AB (ref 65–99)

## 2017-07-23 MED ORDER — NITROGLYCERIN 0.4 MG SL SUBL: 0.4000 mg | SUBLINGUAL_TABLET | SUBLINGUAL | 12 refills | Status: DC | PRN

## 2017-07-23 MED ORDER — ATORVASTATIN CALCIUM 80 MG PO TABS: 80.0000 mg | ORAL_TABLET | Freq: Every day | ORAL | 3 refills | Status: DC

## 2017-07-23 MED ORDER — ASPIRIN 81 MG PO TBEC: 81.0000 mg | DELAYED_RELEASE_TABLET | Freq: Every day | ORAL | 3 refills | Status: AC

## 2017-07-23 MED ORDER — TICAGRELOR 90 MG PO TABS: 90.0000 mg | ORAL_TABLET | Freq: Two times a day (BID) | ORAL | 3 refills | Status: DC

## 2017-07-23 MED ORDER — METOPROLOL TARTRATE 50 MG PO TABS: 50.0000 mg | ORAL_TABLET | Freq: Two times a day (BID) | ORAL | 3 refills | Status: DC

## 2017-07-23 NOTE — Discharge Instructions (Addendum)
Acute Coronary Syndrome °Acute coronary syndrome (ACS) is a serious problem in which there is suddenly not enough blood and oxygen supplied to the heart. ACS may mean that one or more of the blood vessels in your heart (coronary arteries) may be blocked. ACS can result in chest pain or a heart attack (myocardial infarction or MI). °What are the causes? °This condition is caused by atherosclerosis, which is the buildup of fat and cholesterol (plaque) on the inside of the arteries. Over time, the plaque may narrow or block the artery, and this will lessen blood flow to the heart. Plaque can also become weak and break off within a coronary artery to form a clot and cause a sudden blockage. °What increases the risk? °The risk factors of this condition include: °· High cholesterol levels. °· High blood pressure (hypertension). °· Smoking. °· Diabetes. °· Age. °· Family history of chest pain, heart disease, or stroke. °· Lack of exercise. °What are the signs or symptoms? °The most common signs of this condition include: °· Chest pain, which can be: °¨ A crushing or squeezing in the chest. °¨ A tightness, pressure, fullness, or heaviness in the chest. °¨ Present for more than a few minutes, or it can stop and recur. °· Pain in the arms, neck, jaw, or back. °· Unexplained heartburn or indigestion. °· Shortness of breath. °· Nausea. °· Sudden cold sweats. °· Feeling light-headed or dizzy. °Sometimes, this condition has no symptoms. °How is this diagnosed? °ACS may be diagnosed through the following tests: °· Electrocardiogram (ECG). °· Blood tests. °· Coronary angiogram. This is a procedure to look at the coronary arteries to see if there is any blockage. °How is this treated? °Treatment for ACS may include: °· Healthy behavioral changes to reduce or control risk factors. °· Medicine. °· Coronary stenting. A stent helps to keep an artery open. °· Coronary angioplasty. This procedure widens a narrowed or blocked  artery. °· Coronary artery bypass surgery. This will allow your blood to pass the blockage (bypass) to reach your heart. °Follow these instructions at home: °Eating and drinking °· Follow a heart-healthy diet. A dietitian can you help to educate you about healthy food options and changes. °· Use healthy cooking methods such as roasting, grilling, broiling, baking, poaching, steaming, or stir-frying. Talk to a dietitian to learn more about healthy cooking methods. °Medicines °· Take medicines only as directed by your health care provider. °· Do not take the following medicines unless your health care provider approves: °¨ Nonsteroidal anti-inflammatory drugs (NSAIDs), such as ibuprofen, naproxen, or celecoxib. °¨ Vitamin supplements that contain vitamin A, vitamin E, or both. °¨ Hormone replacement therapy that contains estrogen with or without progestin. °· Stop illegal drug use. °Activity °· Follow an exercise program that is approved by your health care provider. °· Plan rest periods when you are fatigued. °Lifestyle °· Do not use any tobacco products, including cigarettes, chewing tobacco, or electronic cigarettes. If you need help quitting, ask your health care provider. °· If you drink alcohol, and your health care provider approves, limit your alcohol intake to no more than 1 drink per day. One drink equals 12 ounces of beer, 5 ounces of wine, or 1½ ounces of hard liquor. °· Learn to manage stress. °· Maintain a healthy weight. Lose weight as approved by your health care provider. °General instructions °· Manage other health conditions, such as hypertension and diabetes, as directed by your health care provider. °· Keep all follow-up visits as directed by your   as directed by your health care provider. This is important.  Your health care provider may ask you to monitor your blood pressure. A blood pressure reading consists of a higher number over a lower number, such as 110 over 72, written as 110/72. Ideally, your blood  pressure should be: ? Below 140/90 if you have no other medical conditions. ? Below 130/80 if you have diabetes or kidney disease. Get help right away if:  You have pain in your chest, neck, arm, jaw, stomach, or back that lasts more than a few minutes, is recurring, or is not relieved by taking medicine under your tongue (sublingual nitroglycerin).  You have profuse sweating without cause.  You have unexplained: ? Heartburn or indigestion. ? Shortness of breath or difficulty breathing. ? Nausea or vomiting. ? Fatigue. ? Feelings of nervousness or anxiety. ? Weakness. ? Diarrhea.  You have sudden light-headedness or dizziness.  You faint. These symptoms may represent a serious problem that is an emergency. Do not wait to see if the symptoms will go away. Get medical help right away. Call your local emergency services (911 in the [place] Do not drive yourself to the clinic or hospital. This information is not intended to replace advice given to you by your health care provider. Make sure you discuss any questions you have with your health care provider. Document Released: 12/09/2005 Document Revised: 05/22/2016 Document Reviewed: 04/12/2014 Elsevier Interactive Patient Education  2017 [employer].   Femoral Site Care Refer to this sheet in the next few weeks. These instructions provide you with information about caring for yourself after your procedure. Your health care provider may also give you more specific instructions. Your treatment has been planned according to current medical practices, but problems sometimes occur. Call your health care provider if you have any problems or questions after your procedure. What can I expect after the procedure? After your procedure, it is typical to have the following: Bruising at the site that usually fades within 1-2 weeks. Blood collecting in the tissue (hematoma) that may be painful to the touch. It should usually decrease in size and  tenderness within 1-2 weeks.  Follow these instructions at home: Take medicines only as directed by your health care provider. You may shower 24-48 hours after the procedure or as directed by your health care provider. Remove the bandage (dressing) and gently wash the site with plain soap and water. Pat the area dry with a clean towel. Do not rub the site, because this may cause bleeding. Do not take baths, swim, or use a hot tub until your health care provider approves. Check your insertion site every day for redness, swelling, or drainage. Do not apply powder or lotion to the site. Limit use of stairs to twice a day for the first 2-3 days or as directed by your health care provider. Do not squat for the first 2-3 days or as directed by your health care provider. Do not lift over 10 lb (4.5 kg) for 5 days after your procedure or as directed by your health care provider. Ask your health care provider when it is okay to: Return to work or school. Resume usual physical activities or sports. Resume sexual activity. Do not drive home if you are discharged the same day as the procedure. Have someone else drive you. You may drive 24 hours after the procedure unless otherwise instructed by your health care provider. Do not operate machinery or power tools for 24 hours after the procedure or  as directed by your health care provider. If your procedure was done as an outpatient procedure, which means that you went home the same day as your procedure, a responsible adult should be with you for the first 24 hours after you arrive home. Keep all follow-up visits as directed by your health care provider. This is important. Contact a health care provider if: You have a fever. You have chills. You have increased bleeding from the site. Hold pressure on the site. Get help right away if: You have unusual pain at the site. You have redness, warmth, or swelling at the site. You have drainage (other than a  small amount of blood on the dressing) from the site. The site is bleeding, and the bleeding does not stop after 30 minutes of holding steady pressure on the site. Your leg or foot becomes pale, cool, tingly, or numb. This information is not intended to replace advice given to you by your health care provider. Make sure you discuss any questions you have with your health care provider. Document Released: 08/12/2014 Document Revised: 05/16/2016 Document Reviewed: 06/28/2014 Elsevier Interactive Patient Education  [employer].

## 2017-07-23 NOTE — Discharge Summary (Signed)
Erica Nguyen, Erica Nguyen                 ACCOUNT NO.:  [id number]  MEDICAL RECORD NO.:  [id number]  LOCATION:  3W17C                        FACILITY:  [facility]  PHYSICIAN:  Allegra Lai. Terrence Dupont, M.D. DATE OF BIRTH:  1971-04-12  DATE OF ADMISSION:  07/20/2017 DATE OF DISCHARGE:  07/23/2017                              DISCHARGE SUMMARY   ADMITTING DIAGNOSES: 1. Probable small acute non-Q-wave myocardial infarction. 2. Hypertension. 3. Uncontrolled diabetes mellitus. 4. Morbid obesity. 5. Positive family history of coronary artery disease.  DISCHARGE DIAGNOSES: 1. Status post acute small non-Q-wave myocardial infarction status     post left cardiac catheterization/PTCA stenting to RCA and LAD. 2. Multivessel coronary artery disease. 3. Ischemic cardiomyopathy. 4. Hypertension. 5. Uncontrolled diabetes mellitus. 6. Hyperlipidemia. 7. Morbid obesity. 8. Positive family history of coronary artery disease.  DISCHARGE MEDICATIONS: 1. Aspirin 81 mg 1 tablet daily. 2. Atorvastatin 80 mg 1 tablet daily. 3. Metoprolol tartrate 50 mg 1 tablet twice daily. 4. Nitrostat sublingual 0.4 mg use as directed. 5. Brilinta 90 mg 1 tablet twice daily. 6. Tylenol 1000 mg every 6 hours as needed as before. 7. Tresiba 40 units subcutaneous in the morning. 8. Humalog 20 units daily before supper. 9. Lisinopril 5 mg daily.  DIET:  Low-salt, low-cholesterol, 1800 calories ADA weight reducing diet.  DISCHARGE INSTRUCTIONS:  The patient has been discussed at length regarding lifestyle changes, compliance with diet, medication, weight loss.  States has seen dietitian in the past and intends to follow with her.  Post cardiac cath/PTCA stent instructions have been given.  The patient will be scheduled for phase 2 cardiac rehab.  The patient has been advised to monitor blood pressure and blood sugar daily and chart.  CONDITION AT DISCHARGE:  Stable.  FOLLOWUP:  Follow up with me in 1 week and follow up with  Endocrinology as scheduled.  BRIEF HISTORY AND HOSPITAL COURSE:  Ms. Bhandari is a 46 year old female with past medical history significant for hypertension, insulin- requiring diabetes mellitus, morbid obesity, strong family history of coronary artery disease.  Mother died of MI in her 34s.  She came to the ER by EMS complaining of retrosternal and left-sided chest heaviness described as grade 9/10 associated with shortness of breath, which woke her up around 4:00 a.m.  Did not seek any medical attention, but as the patient's pain continued, so decided to call EMS.  The patient denies any nausea, vomiting, or diaphoresis.  Denies PND, orthopnea, or leg swelling.  Denies palpitation, lightheadedness, or syncope.  EKG done in the ED showed sinus tachycardia with nonspecific ST-T wave changes and was noted to have minimally elevated troponin I.  The patient also had CT angiography of the chest, which showed no evidence of PE or dissection.  The patient was started on IV heparin and nitro with improvement of chest pain to 2 to 3/10.  PHYSICAL EXAMINATION:  GENERAL:  She was alert, awake, and oriented x3. VITAL SIGNS:  Blood pressure was 130/85, pulse 117, sinus tach on the monitor.  She was afebrile. HEENT:  Conjunctivae was pink. NECK:  Supple.  No JVD.  No bruit. LUNGS:  Decreased breath sounds at bases. CARDIOVASCULAR:  S1, S2  was normal.  Soft.  The patient was tachycardic. There was soft systolic murmur.  No S3, gallop. ABDOMEN:  Soft and nontender. EXTREMITIES:  There was no clubbing, cyanosis, or edema. NEUROLOGIC:  Grossly intact.  LABORATORY DATA:  Her admission labs; sodium 138, potassium 4.0, BUN 12, creatinine 0.66, glucose was 343.  Her troponin I was 0.79.  Repeat troponin was trending down 0.30, 0.20, 0.17.  Her hemoglobin was 12.6, hematocrit 37.6, white count of 12.1.  Magnesium was 1.6, which was replaced.  Last potassium was 3.2, which was also replaced.  Last BUN 11,  creatinine 0.74, hemoglobin 10.9, and hematocrit 32.7.  BRIEF HOSPITAL COURSE:  The patient was admitted to step-down unit.  The patient ruled in for small non-Q-wave myocardial infarction.  The patient subsequently underwent left cardiac catheterization/PTCA stenting to RCA and LAD as per procedure report.  The patient tolerated the procedure well.  There were no complications.  Postprocedure, the patient did not have any episodes of chest pain during the hospital stay.  Her groin is stable with no evidence of hematoma or bruit.  The patient is ambulating in room without any problems.  The patient will be discharged home on above medications and will be followed up in my office in 1 week.  The patient has been counseled extensively regarding her diet, compliance with medication, and joining phase 2 cardiac rehab. We will up titrate her ACE inhibitors as outpatient as blood pressure tolerates.     Allegra Lai. Terrence Dupont, M.D.     MNH/MEDQ  D:  07/23/2017  T:  07/23/2017  Job:  [id number]  cc:   Allegra Lai. Terrence Dupont, M.D.

## 2017-07-23 NOTE — Plan of Care (Signed)
Problem: Activity: Goal: Ability to tolerate increased activity will improve Outcome: Progressing No pain with ambulation. Cath site level 0. Fall risk bundle in place per policy. Will continue to monitor.

## 2017-07-23 NOTE — Progress Notes (Signed)
CARDIAC REHAB PHASE I   PRE:  Rate/Rhythm: 93 SR  BP:  Supine:   Sitting:   Standing: 124/83   SaO2:   MODE:  Ambulation: 550 ft   POST:  Rate/Rhythm: 109 ST  BP:  Supine:   Sitting: 126/69  Standing:    SaO2:  2197-5883 Pt walked 550 ft on RA with steady gait. No CP. Answered few questions re ed done yesterday.  Ready for discharge.   Graylon Good, RN BSN  07/23/2017 9:41 AM

## 2017-07-23 NOTE — Discharge Summary (Signed)
Discharge summary dictated on 07/23/2017.  Dictation number is [phone]

## 2017-07-31 DIAGNOSIS — I214 Non-ST elevation (NSTEMI) myocardial infarction: Secondary | ICD-10-CM | POA: Insufficient documentation

## 2017-08-08 ENCOUNTER — Telehealth (HOSPITAL_COMMUNITY): Payer: Self-pay

## 2017-08-08 NOTE — Telephone Encounter (Signed)
I called patient to discuss scheduling for cardiac rehab. Patient was at an appointment and ask that I call back @ 3pm. I called patient @ 3:12pm and phone went to voicemail. I was unable to leave a message due to voicemail being full.

## 2017-08-18 ENCOUNTER — Telehealth (HOSPITAL_COMMUNITY): Payer: Self-pay

## 2017-08-18 NOTE — Telephone Encounter (Signed)
Patient left message for me to return her call. I returned call 2X to patient to discuss scheduling for cardiac rehab. Patient voicemail is full both times and unable to leave message.

## 2017-09-04 ENCOUNTER — Ambulatory Visit
Admission: RE | Admit: 2017-09-04 | Discharge: 2017-09-04 | Disposition: A | Discharge status: Home / Self Care | Payer: 59 | Source: Ambulatory Visit | Attending: Obstetrics and Gynecology | Admitting: Obstetrics and Gynecology

## 2017-09-04 ENCOUNTER — Ambulatory Visit: Payer: 59

## 2017-09-04 DIAGNOSIS — Z1231 Encounter for screening mammogram for malignant neoplasm of breast: Secondary | ICD-10-CM

## 2017-09-11 ENCOUNTER — Encounter (HOSPITAL_COMMUNITY): Payer: Self-pay

## 2017-09-11 NOTE — Progress Notes (Signed)
Mailed patient letter with informed about cardiac rehab.

## 2017-10-10 ENCOUNTER — Encounter: Payer: Self-pay | Admitting: Cardiology

## 2017-10-10 ENCOUNTER — Emergency Department (HOSPITAL_COMMUNITY): Payer: 59

## 2017-10-10 ENCOUNTER — Emergency Department (HOSPITAL_COMMUNITY)
Admission: EM | Admit: 2017-10-10 | Discharge: 2017-10-10 | Disposition: A | Discharge status: Home / Self Care | Payer: 59 | Attending: Emergency Medicine | Admitting: Emergency Medicine

## 2017-10-10 ENCOUNTER — Telehealth: Payer: Self-pay | Admitting: Cardiology

## 2017-10-10 DIAGNOSIS — Z5321 Procedure and treatment not carried out due to patient leaving prior to being seen by health care provider: Secondary | ICD-10-CM | POA: Insufficient documentation

## 2017-10-10 DIAGNOSIS — R079 Chest pain, unspecified: Secondary | ICD-10-CM | POA: Insufficient documentation

## 2017-10-10 LAB — BASIC METABOLIC PANEL
ANION GAP: 7 (ref 5–15)
BUN: 15 mg/dL (ref 6–20)
CALCIUM: 9.3 mg/dL (ref 8.9–10.3)
CHLORIDE: 104 mmol/L (ref 101–111)
CO2: 26 mmol/L (ref 22–32)
CREATININE: 0.52 mg/dL (ref 0.44–1.00)
GFR calc non Af Amer: >60 mL/min (ref >60)
Glucose, Bld: 103 mg/dL — ABNORMAL HIGH (ref 65–99)
Potassium: 3.8 mmol/L (ref 3.5–5.1)
SODIUM: 137 mmol/L (ref 135–145)

## 2017-10-10 LAB — CBC
HCT: 34.1 % — ABNORMAL LOW (ref 36.0–46.0)
HEMOGLOBIN: 11.3 g/dL — AB (ref 12.0–15.0)
MCH: 28.8 pg (ref 26.0–34.0)
MCHC: 33.1 g/dL (ref 30.0–36.0)
MCV: 87 fL (ref 78.0–100.0)
PLATELETS: 263 10*3/uL (ref 150–400)
RBC: 3.92 MIL/uL (ref 3.87–5.11)
RDW: 13.8 % (ref 11.5–15.5)
WBC: 11.6 10*3/uL — AB (ref 4.0–10.5)

## 2017-10-10 LAB — I-STAT TROPONIN, ED: TROPONIN I, POC: 0 ng/mL (ref 0.00–0.08)

## 2017-10-10 NOTE — ED Notes (Signed)
Still not answering when called.  Removed at this time.

## 2017-10-10 NOTE — ED Notes (Signed)
Called again for vitals with no answer.

## 2017-10-10 NOTE — Telephone Encounter (Signed)
Returning your call. °

## 2017-10-10 NOTE — Progress Notes (Signed)
Referring-Erica E Fulbright, PA-C Reason for referral-Chest pain  HPI: 46 yo female for evaluation of chest pain at request of [place], PA-C. Admitted 7/18 with NSTEMI (peak troponin 0.79). Cared for by Dr Terrence Dupont. Cath revealed EF 35-45; 75 RCA, 75 distal LAD; pt had PCI of RCA and LAD with DES. Approximately 3 weeks ago patient noticed headache, elevated blood pressure and minimal pedal edema. She was seen by Dr. Terrence Dupont and blood pressure medications increased. However she would prefer to be followed here at this point. 3 days ago she had a sharp pain in her chest for 10 seconds while ambulating followed by "pinprick" sensations. She otherwise has had no chest pain similar to her infarct pain. She does have some dyspnea on exertion but no orthopnea, PND, palpitations or syncope. Minimal pedal edema at the end of the day. Cardiology now asked to evaluate.  Current Outpatient Prescriptions  Medication Sig Dispense Refill  . acetaminophen (TYLENOL) 500 MG tablet Take 1,000 mg by mouth every 6 (six) hours as needed for moderate pain or headache.    Marland Kitchen aspirin EC 81 MG EC tablet Take 1 tablet (81 mg total) by mouth daily. 30 tablet 3  . atorvastatin (LIPITOR) 80 MG tablet Take 1 tablet (80 mg total) by mouth daily at 6 PM. 30 tablet 3  . insulin degludec (TRESIBA) 100 UNIT/ML SOPN FlexTouch Pen Inject 40 units SQ in the morning    . insulin lispro (HUMALOG) 100 UNIT/ML injection Inject 20 Units into the skin daily before supper.     Marland Kitchen lisinopril (PRINIVIL,ZESTRIL) 20 MG tablet Take 20 mg by mouth 2 (two) times daily.    . nitroGLYCERIN (NITROSTAT) 0.4 MG SL tablet Place 1 tablet (0.4 mg total) under the tongue every 5 (five) minutes x 3 doses as needed for chest pain. 25 tablet 12  . ticagrelor (BRILINTA) 90 MG TABS tablet Take 1 tablet (90 mg total) by mouth 2 (two) times daily. 60 tablet 3  . metoprolol succinate (TOPROL XL) 25 MG 24 hr tablet Take 1 tablet (25 mg total) by mouth  daily. 90 tablet 3   No current facility-administered medications for this visit.     No Known Allergies   Past Medical History:  Diagnosis Date  . Abnormal Pap smear   . CAD (coronary artery disease)   . Diabetes mellitus    insulin-dependent type 2  . Family history of adverse reaction to anesthesia    sister has n/v  . Fibroid   . HNP (herniated nucleus pulposus)   . Hypertension    chronic hypertension    Past Surgical History:  Procedure Laterality Date  . CARDIAC CATHETERIZATION  02/29/2008   Normal coronary arteries -- Normal LV (left ventricular) systolic function -- Mild to moderate elevation in left ventricular end-diastolic  pressure secondary to hypertension, diabetes and obesity  . CHOLECYSTECTOMY  1995  . CORONARY STENT INTERVENTION N/A 07/21/2017   Procedure: Coronary Stent Intervention;  Surgeon: [name], MD;  Location: [facility];  Service: Cardiovascular;  Laterality: N/A;  . DECOMPRESSIVE LUMBAR LAMINECTOMY LEVEL 1 Right 11/22/2015   Procedure: DECOMPRESSIVE LUMBAR LAMINECTOMY L5-S1 ON RIGHT,FORAMINOTOMIES L5,S1 DISCECTOMY L5,S1;  Surgeon: Susa Day, MD;  Location: [facility];  Service: Orthopedics;  Laterality: Right;  . GYNECOLOGIC CRYOSURGERY  1998  . Hysteroscopy, D&C, Novasure ablation, removal of Intrauterine device  01/08/2008  . LEFT HEART CATH AND CORONARY ANGIOGRAPHY N/A 07/21/2017   Procedure: Left Heart Cath and Coronary Angiography;  Surgeon: [name], MD;  Location: [facility];  Service: Cardiovascular;  Laterality: N/A;  . LUMBAR LAMINECTOMY/DECOMPRESSION MICRODISCECTOMY Right 10/11/2016   Procedure: REVISION MICRO LUMBAR DECOMPRESSION L5-S1 RIGHT;  Surgeon: Susa Day, MD;  Location: [facility];  Service: Orthopedics;  Laterality: Right;  . [place]  01/08/2008    Social History   Social History  . Marital status: Single    Spouse name: N/A  . Number of children: 1  . Years of education: N/A    Occupational History  . [employer] consierge    Social History Main Topics  . Smoking status: Never Smoker  . Smokeless tobacco: Never Used  . Alcohol use No  . Drug use: No  . Sexual activity: Yes    Birth control/ protection: Surgical     Comment: x 1 partner   Other Topics Concern  . Not on file   Social History Narrative  . No narrative on file    Family History  Problem Relation Age of Onset  . Heart disease Mother   . Diabetes Mother   . Hypertension Father   . Diabetes Father   . Kidney failure Father   . Stroke Father   . Hypertension Paternal Grandmother   . Diabetes Paternal Grandmother   . Stroke Paternal Grandmother   . Diabetes Maternal Grandmother   . Ovarian cancer Maternal Grandmother   . Diabetes Maternal Grandfather   . Diabetes Paternal Grandfather     ROS: Fatigue but no fevers or chills, productive cough, hemoptysis, dysphasia, odynophagia, melena, hematochezia, dysuria, hematuria, rash, seizure activity, orthopnea, PND, pedal edema, claudication. Remaining systems are negative.  Physical Exam:   Blood pressure 134/78, pulse 70, height 5\' 6"  (1.676 m), weight 255 lb (115.7 kg), last menstrual period 10/09/2017.  General:  Well developed/obese in NAD Skin warm/dry Patient not depressed No peripheral clubbing Back-normal HEENT-normal/normal eyelids Neck supple/normal carotid upstroke bilaterally; no bruits; no JVD; no thyromegaly chest - CTA/ normal expansion CV - RRR/normal S1 and S2; no murmurs, rubs or gallops;  PMI nondisplaced Abdomen -NT/ND, no HSM, no mass, + bowel sounds, no bruit 2+ femoral pulses, no bruits Ext-no edema, chords, 2+ DP Neuro-grossly nonfocal  ECG - 07/21/17-NSR, diffuse TWI, prolonged QT. personally reviewed  Electrocardiogram today shows sinus rhythm with no ST changes.  A/P  1 coronary artery disease-plan to continue aspirin and statin. Continue brilinta for one year following PCI in July. Note approximately  20 minutes spent reviewing records prior to patient arrival.   2 chest pain-symptoms are atypical and electrocardiogram shows no ST changes. Her symptoms are unlike her infarct pain. We will not pursue further ischemia evaluation at this point.  3 ischemic cardiomyopathy-continue ACE inhibitor. She has had some fatigue which may be secondary to her beta blocker. Discontinue short-acting metoprolol and instead treat with Toprol 100 mg daily at bedtime. Repeat echocardiogram to see if LV function has improved.  4 hypertension-blood pressure is now controlled. Continue present medications.  5 hyperlipidemia-continue statin.  6 obesity-we discussed the importance of exercise, diet and weight loss.  Kirk Ruths, MD

## 2017-10-10 NOTE — Telephone Encounter (Signed)
Please,pt is being seen here Monday as a new pt.She was a pt here five years ago.Pt had a heart attack in July of this year. She said this morning she had sharp stabbing pain in her chest that stopped her in her tracks. She said at this time she still have a little pain in her chest.

## 2017-10-10 NOTE — Telephone Encounter (Signed)
LMTCB  Patient has not seen [facility] provider Cath/stent performed by Dr. Terrence Dupont to LAD/RCA  New patient appointment with MD on 10/22  Patient would likely need to contact Dr. Zenia Resides office or seek ED eval since she is not established with our cardiology staff.

## 2017-10-10 NOTE — ED Notes (Signed)
Pt did not respond when taking vitals.

## 2017-10-10 NOTE — ED Triage Notes (Signed)
Pt reports L CP onset this morning, describes as "sharp, stinging" intermittent. Pt denies pain at this time.  Pt endorses SOB with exertion, denies n./v, diaphoresis. Pt reports hx MI with stent placement in July 2018.  Resp e/u at this time.

## 2017-10-10 NOTE — Telephone Encounter (Signed)
Returned call to patient. She saw Dr. Terrence Dupont last week. She had elevated BP last week and c/o chest discomfort. She did not have an EKG performed at her visit with her cardiologist. She is switching MDs (to Dr. Stanford Breed) as she was dissatisfied with her care.   Advised ED eval for acute symptoms given recent MI + PCI She voiced understanding.

## 2017-10-13 ENCOUNTER — Encounter: Payer: Self-pay | Admitting: Cardiology

## 2017-10-13 ENCOUNTER — Telehealth: Payer: Self-pay | Admitting: Cardiology

## 2017-10-13 ENCOUNTER — Ambulatory Visit (INDEPENDENT_AMBULATORY_CARE_PROVIDER_SITE_OTHER): Payer: 59 | Admitting: Cardiology

## 2017-10-13 VITALS — BP 134/78 | HR 70 | Ht 66.0 in | Wt 255.0 lb

## 2017-10-13 DIAGNOSIS — I2510 Atherosclerotic heart disease of native coronary artery without angina pectoris: Secondary | ICD-10-CM | POA: Diagnosis not present

## 2017-10-13 DIAGNOSIS — R072 Precordial pain: Secondary | ICD-10-CM

## 2017-10-13 DIAGNOSIS — I255 Ischemic cardiomyopathy: Secondary | ICD-10-CM

## 2017-10-13 DIAGNOSIS — E7800 Pure hypercholesterolemia, unspecified: Secondary | ICD-10-CM | POA: Diagnosis not present

## 2017-10-13 DIAGNOSIS — I10 Essential (primary) hypertension: Secondary | ICD-10-CM | POA: Diagnosis not present

## 2017-10-13 MED ORDER — METOPROLOL SUCCINATE ER 25 MG PO TB24: 25.0000 mg | ORAL_TABLET | Freq: Every day | ORAL | 3 refills | Status: DC

## 2017-10-13 NOTE — Telephone Encounter (Signed)
Received records from [facility] and [facility] for appointment on 10/13/17 with Dr Stanford Breed.  Records put with Dr Jacalyn Lefevre schedule for 10/13/17. lp

## 2017-10-13 NOTE — Patient Instructions (Signed)
Medication Instructions:   STOP METOPROLOL TARTRATE  START METOPROLOL SUCC ER 100 MG ONCE DAILY AT BEDTIME  Testing/Procedures:  Your physician has requested that you have an echocardiogram. Echocardiography is a painless test that uses sound waves to create images of your heart. It provides your doctor with information about the size and shape of your heart and how well your heart's chambers and valves are working. This procedure takes approximately one hour. There are no restrictions for this procedure.    Follow-Up:  Your physician recommends that you schedule a follow-up appointment in: [place]   If you need a refill on your cardiac medications before your next appointment, please call your pharmacy.

## 2017-10-20 ENCOUNTER — Ambulatory Visit (HOSPITAL_COMMUNITY): Payer: 59 | Attending: Cardiology

## 2017-10-21 ENCOUNTER — Telehealth (HOSPITAL_COMMUNITY): Payer: Self-pay

## 2017-10-21 NOTE — Telephone Encounter (Signed)
Patient has called to reschedule for Cardiac Rehab - Will update Insurance verification

## 2017-10-21 NOTE — Telephone Encounter (Signed)
Patient returned phone call in regards to Cardiac Rehab - She is interested in program. Scheduled orientation on 11/06/2017 at 7:30am. Patient will be attending the 2:45pm exc class.

## 2017-10-21 NOTE — Telephone Encounter (Signed)
UPDATED: Patients insurance is active and benefits verified with [employer]. No co-pay, deductible amount is $1,400/$1,400 has been met, out of pocket amount is $3,400/$3,400 has been met, 20% co-insurance, and no pre-authorization is required. Passport/reference [phone]

## 2017-11-04 ENCOUNTER — Telehealth (HOSPITAL_COMMUNITY): Payer: Self-pay | Admitting: Pharmacy Technician

## 2017-11-06 ENCOUNTER — Inpatient Hospital Stay (HOSPITAL_COMMUNITY): Admission: RE | Admit: 2017-11-06 | Payer: 59 | Source: Ambulatory Visit

## 2017-11-06 ENCOUNTER — Telehealth (HOSPITAL_COMMUNITY): Payer: Self-pay | Admitting: *Deleted

## 2017-11-06 NOTE — Telephone Encounter (Signed)
Pt is a no show no call for her appt today for orientation.  Pt did not return call from the pharmacist for medication intake or the message left for pt to call and complete a nursing interview over the phone.  Pt will need to contact cardiac rehab to reschedule this appt. Cherre Huger, BSN Cardiac and Training and development officer

## 2017-11-10 ENCOUNTER — Ambulatory Visit (HOSPITAL_COMMUNITY): Payer: 59

## 2017-11-12 ENCOUNTER — Ambulatory Visit (HOSPITAL_COMMUNITY): Payer: 59

## 2017-11-17 ENCOUNTER — Ambulatory Visit (HOSPITAL_COMMUNITY): Payer: 59

## 2017-11-19 ENCOUNTER — Ambulatory Visit (HOSPITAL_COMMUNITY): Payer: 59

## 2017-11-20 ENCOUNTER — Telehealth (HOSPITAL_COMMUNITY): Payer: Self-pay | Admitting: Cardiology

## 2017-11-20 ENCOUNTER — Encounter (HOSPITAL_COMMUNITY): Payer: Self-pay | Admitting: Radiology

## 2017-11-20 NOTE — Telephone Encounter (Signed)
patient nos 10/20/17 10/24/17 lmsg to resched. echo evd 11/17/17 lmsg to CB to r/s echo. RG 11/20/17 Called pt and lmsg for her to CB to get r/s echo that was missed on 10/29.Marland KitchenRG  User: Cherie Dark A Date/time: 11/20/2017 1:35 PM  Comment: Called pt and lmsg for her to CB to get r/s echo that was missed on 10/29.Marland KitchenRG  Context: [place] Schedule Orders/Appt Requests Outcome: Left Message  Phone number: [phone] Phone Type: Home Phone  Comm. type: Telephone Call type: Outgoing  Contact: Arville Care V Relation to patient: Self  Letter:      User: Cherie Dark A Date/time: 11/17/2017 9:34 AM  Comment: Called pt and lmsg for her to CB to get r/s for an echo.   Context: [place] Schedule Orders/Appt Requests Outcome: Left Message  Phone number: [phone] Phone Type: Home Phone  Comm. type: Telephone Call type: Outgoing  Contact: Arville Care V Relation to patient: Self  Letter:

## 2017-11-21 ENCOUNTER — Ambulatory Visit (HOSPITAL_COMMUNITY): Payer: 59

## 2017-11-24 ENCOUNTER — Ambulatory Visit (HOSPITAL_COMMUNITY): Payer: 59

## 2017-11-26 ENCOUNTER — Ambulatory Visit (HOSPITAL_COMMUNITY): Payer: 59

## 2017-11-28 ENCOUNTER — Ambulatory Visit (HOSPITAL_COMMUNITY): Payer: 59

## 2017-12-01 ENCOUNTER — Other Ambulatory Visit (HOSPITAL_COMMUNITY): Payer: 59

## 2017-12-01 ENCOUNTER — Ambulatory Visit (HOSPITAL_COMMUNITY): Payer: 59

## 2017-12-03 ENCOUNTER — Ambulatory Visit (HOSPITAL_COMMUNITY): Payer: 59

## 2017-12-04 ENCOUNTER — Other Ambulatory Visit: Payer: Self-pay

## 2017-12-04 ENCOUNTER — Ambulatory Visit (HOSPITAL_COMMUNITY): Payer: 59 | Attending: Cardiovascular Disease

## 2017-12-04 DIAGNOSIS — I5030 Unspecified diastolic (congestive) heart failure: Secondary | ICD-10-CM | POA: Insufficient documentation

## 2017-12-04 DIAGNOSIS — I2510 Atherosclerotic heart disease of native coronary artery without angina pectoris: Secondary | ICD-10-CM | POA: Diagnosis not present

## 2017-12-05 ENCOUNTER — Ambulatory Visit (HOSPITAL_COMMUNITY): Payer: 59

## 2017-12-08 ENCOUNTER — Ambulatory Visit (HOSPITAL_COMMUNITY): Payer: 59

## 2017-12-10 ENCOUNTER — Ambulatory Visit (HOSPITAL_COMMUNITY): Payer: 59

## 2017-12-12 ENCOUNTER — Ambulatory Visit (HOSPITAL_COMMUNITY): Payer: 59

## 2017-12-17 ENCOUNTER — Ambulatory Visit (HOSPITAL_COMMUNITY): Payer: 59

## 2017-12-19 ENCOUNTER — Ambulatory Visit (HOSPITAL_COMMUNITY): Payer: 59

## 2017-12-22 ENCOUNTER — Ambulatory Visit (HOSPITAL_COMMUNITY): Payer: 59

## 2017-12-24 ENCOUNTER — Ambulatory Visit (HOSPITAL_COMMUNITY): Payer: 59

## 2017-12-26 ENCOUNTER — Ambulatory Visit (HOSPITAL_COMMUNITY): Payer: 59

## 2017-12-29 ENCOUNTER — Ambulatory Visit (HOSPITAL_COMMUNITY): Payer: 59

## 2017-12-31 ENCOUNTER — Ambulatory Visit (HOSPITAL_COMMUNITY): Payer: 59

## 2018-01-02 ENCOUNTER — Ambulatory Visit (HOSPITAL_COMMUNITY): Payer: 59

## 2018-01-05 ENCOUNTER — Ambulatory Visit (HOSPITAL_COMMUNITY): Payer: 59

## 2018-01-07 ENCOUNTER — Ambulatory Visit (HOSPITAL_COMMUNITY): Payer: 59

## 2018-01-07 NOTE — Progress Notes (Deleted)
HPI: FU chest pain. Admitted 7/18 with NSTEMI (peak troponin 0.79). Cared for by Dr Terrence Dupont. Cath revealed EF 35-45; 75 RCA, 75 distal LAD; pt had PCI of RCA and LAD with DES. echocardiogram repeated December 2018 and showed normal LV function. Since last seen  Current Outpatient Medications  Medication Sig Dispense Refill  . acetaminophen (TYLENOL) 500 MG tablet Take 1,000 mg by mouth every 6 (six) hours as needed for moderate pain or headache.    Marland Kitchen aspirin EC 81 MG EC tablet Take 1 tablet (81 mg total) by mouth daily. 30 tablet 3  . atorvastatin (LIPITOR) 80 MG tablet Take 1 tablet (80 mg total) by mouth daily at 6 PM. 30 tablet 3  . insulin degludec (TRESIBA) 100 UNIT/ML SOPN FlexTouch Pen Inject 40 units SQ in the morning    . insulin lispro (HUMALOG) 100 UNIT/ML injection Inject 20 Units into the skin daily before supper.     Marland Kitchen lisinopril (PRINIVIL,ZESTRIL) 20 MG tablet Take 20 mg by mouth 2 (two) times daily.    . metoprolol succinate (TOPROL XL) 25 MG 24 hr tablet Take 1 tablet (25 mg total) by mouth daily. 90 tablet 3  . nitroGLYCERIN (NITROSTAT) 0.4 MG SL tablet Place 1 tablet (0.4 mg total) under the tongue every 5 (five) minutes x 3 doses as needed for chest pain. 25 tablet 12  . ticagrelor (BRILINTA) 90 MG TABS tablet Take 1 tablet (90 mg total) by mouth 2 (two) times daily. 60 tablet 3   No current facility-administered medications for this visit.      Past Medical History:  Diagnosis Date  . Abnormal Pap smear   . CAD (coronary artery disease)   . Diabetes mellitus    insulin-dependent type 2  . Family history of adverse reaction to anesthesia    sister has n/v  . Fibroid   . HNP (herniated nucleus pulposus)   . Hypertension    chronic hypertension    Past Surgical History:  Procedure Laterality Date  . CARDIAC CATHETERIZATION  02/29/2008   Normal coronary arteries -- Normal LV (left ventricular) systolic function -- Mild to moderate elevation in left  ventricular end-diastolic  pressure secondary to hypertension, diabetes and obesity  . CHOLECYSTECTOMY  1995  . CORONARY STENT INTERVENTION N/A 07/21/2017   Procedure: Coronary Stent Intervention;  Surgeon: [name], MD;  Location: [facility];  Service: Cardiovascular;  Laterality: N/A;  . DECOMPRESSIVE LUMBAR LAMINECTOMY LEVEL 1 Right 11/22/2015   Procedure: DECOMPRESSIVE LUMBAR LAMINECTOMY L5-S1 ON RIGHT,FORAMINOTOMIES L5,S1 DISCECTOMY L5,S1;  Surgeon: Susa Day, MD;  Location: [facility];  Service: Orthopedics;  Laterality: Right;  . GYNECOLOGIC CRYOSURGERY  1998  . Hysteroscopy, D&C, Novasure ablation, removal of Intrauterine device  01/08/2008  . LEFT HEART CATH AND CORONARY ANGIOGRAPHY N/A 07/21/2017   Procedure: Left Heart Cath and Coronary Angiography;  Surgeon: [name], MD;  Location: [facility];  Service: Cardiovascular;  Laterality: N/A;  . LUMBAR LAMINECTOMY/DECOMPRESSION MICRODISCECTOMY Right 10/11/2016   Procedure: REVISION MICRO LUMBAR DECOMPRESSION L5-S1 RIGHT;  Surgeon: Susa Day, MD;  Location: [facility];  Service: Orthopedics;  Laterality: Right;  . [place]  01/08/2008    Social History   Socioeconomic History  . Marital status: Single    Spouse name: Not on file  . Number of children: 1  . Years of education: Not on file  . Highest education level: Not on file  Social Needs  . Financial resource strain: Not on  file  . Food insecurity - worry: Not on file  . Food insecurity - inability: Not on file  . Transportation needs - medical: Not on file  . Transportation needs - non-medical: Not on file  Occupational History  . Occupation: [employer], consierge  Tobacco Use  . Smoking status: Never Smoker  . Smokeless tobacco: Never Used  Substance and Sexual Activity  . Alcohol use: No  . Drug use: No  . Sexual activity: Yes    Birth control/protection: Surgical    Comment: x 1 partner  Other Topics Concern  . Not on file  Social  History Narrative  . Not on file    Family History  Problem Relation Age of Onset  . Heart disease Mother   . Diabetes Mother   . Hypertension Father   . Diabetes Father   . Kidney failure Father   . Stroke Father   . Hypertension Paternal Grandmother   . Diabetes Paternal Grandmother   . Stroke Paternal Grandmother   . Diabetes Maternal Grandmother   . Ovarian cancer Maternal Grandmother   . Diabetes Maternal Grandfather   . Diabetes Paternal Grandfather     ROS: no fevers or chills, productive cough, hemoptysis, dysphasia, odynophagia, melena, hematochezia, dysuria, hematuria, rash, seizure activity, orthopnea, PND, pedal edema, claudication. Remaining systems are negative.  Physical Exam: Well-developed well-nourished in no acute distress.  Skin is warm and dry.  HEENT is normal.  Neck is supple.  Chest is clear to auscultation with normal expansion.  Cardiovascular exam is regular rate and rhythm.  Abdominal exam nontender or distended. No masses palpated. Extremities show no edema. neuro grossly intact  ECG- personally reviewed  A/P  1  Kirk Ruths, MD

## 2018-01-09 ENCOUNTER — Ambulatory Visit (HOSPITAL_COMMUNITY): Payer: 59

## 2018-01-12 ENCOUNTER — Ambulatory Visit (HOSPITAL_COMMUNITY): Payer: 59

## 2018-01-13 ENCOUNTER — Ambulatory Visit: Payer: 59 | Admitting: Cardiology

## 2018-01-13 ENCOUNTER — Other Ambulatory Visit: Payer: Self-pay

## 2018-01-14 ENCOUNTER — Ambulatory Visit (HOSPITAL_COMMUNITY): Payer: 59

## 2018-01-16 ENCOUNTER — Ambulatory Visit (HOSPITAL_COMMUNITY): Payer: 59

## 2018-01-19 ENCOUNTER — Ambulatory Visit (HOSPITAL_COMMUNITY): Payer: 59

## 2018-01-21 ENCOUNTER — Ambulatory Visit (HOSPITAL_COMMUNITY): Payer: 59

## 2018-01-23 ENCOUNTER — Ambulatory Visit (HOSPITAL_COMMUNITY): Payer: 59

## 2018-01-26 ENCOUNTER — Ambulatory Visit (HOSPITAL_COMMUNITY): Payer: 59

## 2018-01-27 ENCOUNTER — Ambulatory Visit (INDEPENDENT_AMBULATORY_CARE_PROVIDER_SITE_OTHER): Payer: 59 | Admitting: Physician Assistant

## 2018-01-27 ENCOUNTER — Encounter: Payer: Self-pay | Admitting: Physician Assistant

## 2018-01-27 VITALS — BP 152/80 | HR 80 | Ht 65.0 in | Wt 255.0 lb

## 2018-01-27 DIAGNOSIS — I255 Ischemic cardiomyopathy: Secondary | ICD-10-CM | POA: Diagnosis not present

## 2018-01-27 DIAGNOSIS — E785 Hyperlipidemia, unspecified: Secondary | ICD-10-CM

## 2018-01-27 DIAGNOSIS — I2510 Atherosclerotic heart disease of native coronary artery without angina pectoris: Secondary | ICD-10-CM | POA: Diagnosis not present

## 2018-01-27 DIAGNOSIS — I10 Essential (primary) hypertension: Secondary | ICD-10-CM | POA: Diagnosis not present

## 2018-01-27 DIAGNOSIS — E119 Type 2 diabetes mellitus without complications: Secondary | ICD-10-CM

## 2018-01-27 MED ORDER — METOPROLOL SUCCINATE ER 50 MG PO TB24: 50.0000 mg | ORAL_TABLET | Freq: Every day | ORAL | Status: DC

## 2018-01-27 NOTE — Progress Notes (Signed)
Cardiology Office Note:    Date:  01/28/2018   ID:  Erica Nguyen, DOB December 23, 1971, MRN [id number]  PCP:  Elisabeth Cara, PA-C  Cardiologist:  Kirk Ruths, MD   Referring MD: Elisabeth Cara, *   Chief Complaint  Patient presents with  . Follow-up    seen for Dr. Stanford Breed. 3 month followup    History of Present Illness:    Erica Nguyen is a 47 y.o. female with a hx of NSTEMI (06/2017), DM, HTN, HLD, and ischemic cardiomyopathy with recovery of LVEF to 50-55% 11/2017. She was admitted in July 2018 with an NSTEMI in 2018.  She was cared for by Dr. Terrence Dupont at that time.  Cath revealed an EF of 35-45%; 75% stenosis in the RCA, 75% stenosis in the distal LAD patient had DES to RCA and LAD.  Patient has since followed with [place].  She was seen by Dr. Stanford Breed on 10/13/2017.  At that time she was having sharp pain in her chest while ambulating.  Following chart review and repeat EKG, no further ischemic evaluation was recommended at that time.  She is continued on aspirin and Brilinta for 12 months following DES in July 2018.  She was found to have ischemic cardiomyopathy and was continued on an ACE inhibitor.  Short acting Lopressor was transitioned to 25 mg Toprol for fatigue and LV dysfunction. Echocardiogram obtained on 12/04/2017 showed EF 50-55%, grade 2 DD, PA peak pressure 35 mmHg.   Patient presents today for cardiology office visit.  She denies any chest pain or shortness of breath.  She has no extremity edema, orthopnea or PND.  Her blood pressure has been borderline elevated, I will increase Toprol-XL to 50 mg daily.  Otherwise she is due for fasting lipid panel and LFT.    Past Medical History:  Diagnosis Date  . Abnormal Pap smear   . CAD (coronary artery disease)   . Diabetes mellitus    insulin-dependent type 2  . Family history of adverse reaction to anesthesia    sister has n/v  . Fibroid   . HNP (herniated nucleus pulposus)   . Hypertension    chronic hypertension    Past Surgical History:  Procedure Laterality Date  . CARDIAC CATHETERIZATION  02/29/2008   Normal coronary arteries -- Normal LV (left ventricular) systolic function -- Mild to moderate elevation in left ventricular end-diastolic  pressure secondary to hypertension, diabetes and obesity  . CHOLECYSTECTOMY  1995  . CORONARY STENT INTERVENTION N/A 07/21/2017   Procedure: Coronary Stent Intervention;  Surgeon: [name], MD;  Location: [facility];  Service: Cardiovascular;  Laterality: N/A;  . DECOMPRESSIVE LUMBAR LAMINECTOMY LEVEL 1 Right 11/22/2015   Procedure: DECOMPRESSIVE LUMBAR LAMINECTOMY L5-S1 ON RIGHT,FORAMINOTOMIES L5,S1 DISCECTOMY L5,S1;  Surgeon: Susa Day, MD;  Location: [facility];  Service: Orthopedics;  Laterality: Right;  . GYNECOLOGIC CRYOSURGERY  1998  . Hysteroscopy, D&C, Novasure ablation, removal of Intrauterine device  01/08/2008  . LEFT HEART CATH AND CORONARY ANGIOGRAPHY N/A 07/21/2017   Procedure: Left Heart Cath and Coronary Angiography;  Surgeon: [name], MD;  Location: [facility];  Service: Cardiovascular;  Laterality: N/A;  . LUMBAR LAMINECTOMY/DECOMPRESSION MICRODISCECTOMY Right 10/11/2016   Procedure: REVISION MICRO LUMBAR DECOMPRESSION L5-S1 RIGHT;  Surgeon: Susa Day, MD;  Location: [facility];  Service: Orthopedics;  Laterality: Right;  . NOVASURE ABLATION  01/08/2008    Current Medications: Current Meds  Medication Sig  . acetaminophen (TYLENOL) 500 MG tablet Take  1,000 mg by mouth every 6 (six) hours as needed for moderate pain or headache.  Marland Kitchen aspirin EC 81 MG EC tablet Take 1 tablet (81 mg total) by mouth daily.  Marland Kitchen atorvastatin (LIPITOR) 80 MG tablet Take 1 tablet (80 mg total) by mouth daily at 6 PM.  . insulin degludec (TRESIBA) 100 UNIT/ML SOPN FlexTouch Pen Inject 40 units SQ in the morning  . insulin lispro (HUMALOG) 100 UNIT/ML injection Inject 20 Units into the skin daily before supper.   Marland Kitchen  lisinopril (PRINIVIL,ZESTRIL) 20 MG tablet Take 20 mg by mouth 2 (two) times daily.  . metoprolol succinate (TOPROL-XL) 50 MG 24 hr tablet Take 1 tablet (50 mg total) by mouth daily.  . nitroGLYCERIN (NITROSTAT) 0.4 MG SL tablet Place 1 tablet (0.4 mg total) under the tongue every 5 (five) minutes x 3 doses as needed for chest pain.  . ticagrelor (BRILINTA) 90 MG TABS tablet Take 1 tablet (90 mg total) by mouth 2 (two) times daily.  . [DISCONTINUED] metoprolol succinate (TOPROL XL) 25 MG 24 hr tablet Take 1 tablet (25 mg total) by mouth daily.     Allergies:   Patient has no known allergies.   Social History   Socioeconomic History  . Marital status: Single    Spouse name: None  . Number of children: 1  . Years of education: None  . Highest education level: None  Social Needs  . Financial resource strain: None  . Food insecurity - worry: None  . Food insecurity - inability: None  . Transportation needs - medical: None  . Transportation needs - non-medical: None  Occupational History  . Occupation: [employer], consierge  Tobacco Use  . Smoking status: Never Smoker  . Smokeless tobacco: Never Used  Substance and Sexual Activity  . Alcohol use: No  . Drug use: No  . Sexual activity: Yes    Birth control/protection: Surgical    Comment: x 1 partner  Other Topics Concern  . None  Social History Narrative  . None     Family History: The patient's family history includes Diabetes in her father, maternal grandfather, maternal grandmother, mother, paternal grandfather, and paternal grandmother; Heart disease in her mother; Hypertension in her father and paternal grandmother; Kidney failure in her father; Ovarian cancer in her maternal grandmother; Stroke in her father and paternal grandmother.  ROS:   Please see the history of present illness.     All other systems reviewed and are negative.  EKGs/Labs/Other Studies Reviewed:    The following studies were reviewed today:  Echo  12/04/17 Study Conclusions - Left ventricle: The cavity size was normal. Wall thickness was   normal. Systolic function was normal. The estimated ejection   fraction was in the range of 50% to 55%. Wall motion was normal;   there were no regional wall motion abnormalities. Features are   consistent with a pseudonormal left ventricular filling pattern,   with concomitant abnormal relaxation and increased filling   pressure (grade 2 diastolic dysfunction). - Pulmonary arteries: Systolic pressure was mildly increased. PA   peak pressure: 35 mm Hg (S).   Left heart cath 07/21/17:  Prox LAD lesion, 20 %stenosed.  Mid LAD lesion, 30 %stenosed.  The left ventricular ejection fraction is 35-45% by visual estimate.  LV end diastolic pressure is mildly elevated.  There is moderate left ventricular systolic dysfunction.  A STENT XIENCE ALPINE RX 3.5X18 drug eluting stent was successfully placed.  Mid RCA lesion, 75 %stenosed.  Post  intervention, there is a 0% residual stenosis.  A STENT XIENCE ALPINE RX 2.5X8 drug eluting stent was successfully placed, and overlaps previously placed stent.  Dist LAD-2 lesion, 30 %stenosed.  Post intervention, there is a 0% residual stenosis.  Dist LAD-1 lesion, 75 %stenosed.  Post intervention, there is a 0% residual stenosis.  A STENT XIENCE ALPINE RX [id number] drug eluting stent was successfully placed.  A STENT XIENCE ALPINE RX 2.5X8 drug-eluting stent was successfully placed.   EKG:  EKG is not ordered today.    Recent Labs: 07/20/2017: Magnesium 1.6 10/10/2017: BUN 15; Creatinine, Ser 0.52; Hemoglobin 11.3; Platelets 263; Potassium 3.8; Sodium 137  Recent Lipid Panel No results found for: CHOL, TRIG, HDL, CHOLHDL, VLDL, LDLCALC, LDLDIRECT  Physical Exam:    VS:  BP (!) 152/80   Pulse 80   Ht 5\' 5"  (1.651 m)   Wt 255 lb (115.7 kg)   BMI 42.43 kg/m     Wt Readings from Last 3 Encounters:  01/27/18 255 lb (115.7 kg)  10/13/17 255  lb (115.7 kg)  10/10/17 246 lb (111.6 kg)     GEN:  Well nourished, well developed in no acute distress HEENT: Normal NECK: No JVD; No carotid bruits LYMPHATICS: No lymphadenopathy CARDIAC: RRR, no murmurs, rubs, gallops RESPIRATORY:  Clear to auscultation without rales, wheezing or rhonchi  ABDOMEN: Soft, non-tender, non-distended MUSCULOSKELETAL:  No edema; No deformity  SKIN: Warm and dry NEUROLOGIC:  Alert and oriented x 3 PSYCHIATRIC:  Normal affect   ASSESSMENT:    1. Coronary artery disease involving native coronary artery of native heart without angina pectoris   2. Essential hypertension   3. Hyperlipidemia, unspecified hyperlipidemia type   4. Controlled type 2 diabetes mellitus without complication, without long-term current use of insulin ([place])   5. Ischemic cardiomyopathy    PLAN:    In order of problems listed above:  1.  CAD status post STEMI 06/2017, DES to RCA and LAD Continue aspirin and Brilinta for 12 months.  She denies any further chest pain.  2.  Ischemic cardiomyopathy Reduced left ventricular ejection fraction noted during heart catheterization to be 35-40%.  Recent echo with left ventricular ejection fraction of 50-55%.  She has been compliant on all medications including beta-blocker and ACE inhibitor.  Continue these medications no diuretic regimen needed at this time.  She appears euvolemic on exam.  3. Hypertension Home medications include lisinopril 20 mg twice daily.  Blood pressure continued to be mildly elevated, increase Toprol-XL to 50 mg daily.  4.  Hyperlipidemia Currently on Lipitor 80 mg daily. She is due for fasting lipid panel and LFT.  5.  Obesity   Coronary artery disease involving native coronary artery of native heart without angina pectoris - Plan: Hepatic function panel, metoprolol succinate (TOPROL-XL) 50 MG 24 hr tablet, Lipid panel  Essential hypertension - Plan: Hepatic function panel, Lipid panel  Hyperlipidemia,  unspecified hyperlipidemia type  Controlled type 2 diabetes mellitus without complication, without long-term current use of insulin ([facility])  Ischemic cardiomyopathy   Medication Adjustments/Labs and Tests Ordered: Current medicines are reviewed at length with the patient today.  Concerns regarding medicines are outlined above.  Orders Placed This Encounter  Procedures  . Hepatic function panel  . Lipid panel   Meds ordered this encounter  Medications  . metoprolol succinate (TOPROL-XL) 50 MG 24 hr tablet    Sig: Take 1 tablet (50 mg total) by mouth daily.    Hilbert Corrigan, [place]  01/28/2018  11:58 PM    [facility]

## 2018-01-27 NOTE — Patient Instructions (Signed)
Medication Instructions: INCREASE the Metoprolol XL to 50 mg daily  If you need a refill on your cardiac medications before your next appointment, please call your pharmacy.   Labwork: Your provider would like for you to return before your next appointment to have the following labs drawn: FASTING Lipid and Liver. You do not need an appointment for the lab. Once in our office lobby there is a podium where you can sign in and ring the doorbell to alert [place] that you are here. The lab is open from 8:00 am to 4:00 pm; closed for lunch from 12:45pm-1:45pm.   Follow-Up: Your physician wants you to follow-up in: 4-5 months with Dr. Trellis Moment will receive a reminder letter in the mail two months in advance. If you don't receive a letter, please call our office at [phone] to schedule this follow-up appointment.   Thank you for choosing Heartcare at [facility]!!

## 2018-01-28 ENCOUNTER — Ambulatory Visit (HOSPITAL_COMMUNITY): Payer: 59

## 2018-01-28 ENCOUNTER — Encounter: Payer: Self-pay | Admitting: Physician Assistant

## 2018-01-30 ENCOUNTER — Ambulatory Visit (HOSPITAL_COMMUNITY): Payer: 59

## 2018-02-17 ENCOUNTER — Telehealth: Payer: Self-pay | Admitting: *Deleted

## 2018-02-17 NOTE — Telephone Encounter (Signed)
   Primary Cardiologist: Kirk Ruths, MD  Chart reviewed as part of pre-operative protocol coverage.   Patient with coronary artery disease status post non-ST elevation myocardial infarction July 2018 treated with drug-eluting stent to the LAD and RCA.  EF was 35-45 at the time of her cardiac catheterization.  EF has improved to normal by echocardiogram December 2018.  Last seen in the office 01/27/18.  We have received a request for the patient to come off of ticagrelor for epidural steroid injection.  As the patient is less than 12 months out from her ACS and PCI, she cannot come off of aspirin or ticagrelor.  I will route this to her primary cardiologist, Dr. Stanford Breed, to get his input prior to communicating our recommendations to the requesting surgeon.  Richardson Dopp, PA-C 02/17/2018, 3:02 PM

## 2018-02-17 NOTE — Telephone Encounter (Signed)
   [facility] Pre-operative Risk Assessment    Request for surgical clearance:  1. What type of surgery is being performed? Lumbar ESI   2. When is this surgery scheduled? 02/26/18   3. What type of clearance is required (medical clearance vs. Pharmacy clearance to hold med vs. Both)? both  4. Are there any medications that need to be held prior to surgery and how long?Brilinta 5 days   5. Practice name and name of physician performing surgery? [facility]    6. What is your office phone and fax number? [phone]   7. Anesthesia type (None, local, MAC, general) ?    Franky Reier A Jeremie Giangrande 02/17/2018, 2:20 PM  _________________________________________________________________   (provider comments below)

## 2018-02-17 NOTE — Telephone Encounter (Signed)
Would prefer to not interrupt asa or brilinta if possible; could DC brilinta 7/19; if procedure absolutely necessary now, can hold brilinta but must continue ASA. Kirk Ruths

## 2018-02-19 NOTE — Telephone Encounter (Signed)
  I will route this recommendation to the requesting party via Epic fax function and remove from pre-op pool.  Please call with questions.  [place], [place] 02/19/2018, 2:13 PM

## 2018-04-27 ENCOUNTER — Telehealth: Payer: Self-pay | Admitting: Physician Assistant

## 2018-04-27 MED ORDER — LISINOPRIL 20 MG PO TABS: 20.0000 mg | ORAL_TABLET | Freq: Two times a day (BID) | ORAL | 1 refills | Status: DC

## 2018-04-27 NOTE — Telephone Encounter (Signed)
New Message   *STAT* If patient is at the pharmacy, call can be transferred to refill team.   1. Which medications need to be refilled? (please list name of each medication and dose if known) lisinopril (PRINIVIL,ZESTRIL) 20 MG tablet  2. Which pharmacy/location (including street and city if local pharmacy) is medication to be sent to? [id number] - [place], [place] [address]  3. Do they need a 30 day or 90 day supply? [place]

## 2018-04-30 ENCOUNTER — Telehealth: Payer: Self-pay | Admitting: Physician Assistant

## 2018-04-30 NOTE — Telephone Encounter (Signed)
New message    Pt c/o of Chest Pain: STAT if CP now or developed within 24 hours  1. Are you having CP right now?NO. Patient states she does get little "pin stick" pains, also back pain  2. Are you experiencing any other symptoms (ex. SOB, nausea, vomiting, sweating)? NO  3. How long have you been experiencing CP? 1 DAY  4. Is your CP continuous or coming and going? Coming and going   5. Have you taken Nitroglycerin? YES ?

## 2018-04-30 NOTE — Telephone Encounter (Signed)
Pt states yesterday evening she started feeling sluggish and developed sharp CP in the mid area and under the crest of left arm. She took 1 nitro with relief.  This morning pt states she experienced pain in left arm that feels like needle pricks. She denies any symptoms at the current moment and states the symptoms doesn't feel like previous MI. Routing to Dr. Stanford Breed for recommendation. Pt also advised to go to ER if symptoms reoccur. Verbalized understanding

## 2018-04-30 NOTE — Telephone Encounter (Signed)
Symptoms dont sound cardiac; can arrange paov if needed Kirk Ruths

## 2018-04-30 NOTE — Telephone Encounter (Signed)
Spoke with pt, Aware of dr Jacalyn Lefevre recommendations. Follow up scheduled at patients request to be checked out.

## 2018-05-01 ENCOUNTER — Encounter: Payer: Self-pay | Admitting: Obstetrics and Gynecology

## 2018-05-01 ENCOUNTER — Ambulatory Visit (INDEPENDENT_AMBULATORY_CARE_PROVIDER_SITE_OTHER): Payer: 59 | Admitting: Obstetrics and Gynecology

## 2018-05-01 ENCOUNTER — Other Ambulatory Visit (HOSPITAL_COMMUNITY)
Admission: RE | Admit: 2018-05-01 | Discharge: 2018-05-01 | Disposition: A | Discharge status: Home / Self Care | Payer: 59 | Source: Ambulatory Visit | Attending: Obstetrics and Gynecology | Admitting: Obstetrics and Gynecology

## 2018-05-01 ENCOUNTER — Other Ambulatory Visit: Payer: Self-pay

## 2018-05-01 VITALS — BP 122/84 | HR 70 | Resp 16 | Ht 65.5 in | Wt 260.0 lb

## 2018-05-01 DIAGNOSIS — Z01419 Encounter for gynecological examination (general) (routine) without abnormal findings: Secondary | ICD-10-CM

## 2018-05-01 DIAGNOSIS — Z1211 Encounter for screening for malignant neoplasm of colon: Secondary | ICD-10-CM | POA: Diagnosis not present

## 2018-05-01 DIAGNOSIS — Z113 Encounter for screening for infections with a predominantly sexual mode of transmission: Secondary | ICD-10-CM

## 2018-05-01 DIAGNOSIS — N920 Excessive and frequent menstruation with regular cycle: Secondary | ICD-10-CM | POA: Diagnosis not present

## 2018-05-01 LAB — POCT URINE PREGNANCY: Preg Test, Ur: NEGATIVE

## 2018-05-01 MED ORDER — IBUPROFEN 800 MG PO TABS: 800.0000 mg | ORAL_TABLET | Freq: Three times a day (TID) | ORAL | 2 refills | Status: DC | PRN

## 2018-05-01 NOTE — Progress Notes (Signed)
47 y.o. [id number] Single African American female here as a new patient for an annual exam.    Menses last for 2 weeks every cycle.  Menses every month.  Heavy bleeding at the beginning of the cycle.  Can stain the sheets.  Pad change pad every 2 hours.  Using super absorbant pads.  Cramping with cycles.  Takes ibuprofen 800 mg every 3 hours.   Pelvic US last year with Dr. Simona Huh and has fibroid again.   Taking blood thinner.  On Brilinta.  Had an MI last summer.  Had 3 stents placed.  Used nitroglycerin twice since her MI.   Reports vaginal odor.  Hx bacterial vaginosis.  Prefers Flagyl if needed.   Sexually active once to twice a month.   Declines future childbearing.   Last HgbA83c 28.   74 year old son. Works for [employer].   PCP:  Dr. Vedia Coffer    Patient's last menstrual period was 04/20/2018.     Period Cycle (Days): 28 Period Duration (Days): 1-2 weeks Period Pattern: Regular Menstrual Flow: Heavy, Moderate Menstrual Control: Maxi pad Menstrual Control Change Freq (Hours): 2 Dysmenorrhea: (!) Severe Dysmenorrhea Symptoms: Cramping(leg pain, back pain)     Sexually active: Yes.    The current method of family planning is "uterine artery embolization." Exercising: No.  The patient does not participate in regular exercise at present. Smoker:  no  Health Maintenance: Pap:  April 2018 - done at [facility] -- Dr. Thurnell Lose History of abnormal Pap:  Yes, hx of cryosurgery/colpo -- normal since per patient MMG:  09/05/17 2D - BIRADS 1 negative/density b Colonoscopy:  n/a BMD:   n/a  Result  n/a TDaP:  July 2018 Gardasil:   no HIV: 07/20/17 Negative Hep C: never Screening Labs:  Endocrinologist   reports that she has never smoked. She has never used smokeless tobacco. She reports that she does not drink alcohol or use drugs.  Past Medical History:  Diagnosis Date  . Abnormal Pap smear   . CAD (coronary artery disease)   . Diabetes mellitus    insulin-dependent type 2  . Family history of adverse reaction to anesthesia    sister has n/v  . Fibroid   . HNP (herniated nucleus pulposus)   . Hypertension    chronic hypertension    Past Surgical History:  Procedure Laterality Date  . CARDIAC CATHETERIZATION  02/29/2008   Normal coronary arteries -- Normal LV (left ventricular) systolic function -- Mild to moderate elevation in left ventricular end-diastolic  pressure secondary to hypertension, diabetes and obesity  . CHOLECYSTECTOMY  1995  . COLPOSCOPY    . CORONARY STENT INTERVENTION N/A 07/21/2017   Procedure: Coronary Stent Intervention;  Surgeon: [name], MD;  Location: [facility];  Service: Cardiovascular;  Laterality: N/A;  . DECOMPRESSIVE LUMBAR LAMINECTOMY LEVEL 1 Right 11/22/2015   Procedure: DECOMPRESSIVE LUMBAR LAMINECTOMY L5-S1 ON RIGHT,FORAMINOTOMIES L5,S1 DISCECTOMY L5,S1;  Surgeon: Susa Day, MD;  Location: [facility];  Service: Orthopedics;  Laterality: Right;  . GYNECOLOGIC CRYOSURGERY  1998  . Hysteroscopy, D&C, Novasure ablation, removal of Intrauterine device  01/08/2008  . LEFT HEART CATH AND CORONARY ANGIOGRAPHY N/A 07/21/2017   Procedure: Left Heart Cath and Coronary Angiography;  Surgeon: [name], MD;  Location: [place] [facility];  Service: Cardiovascular;  Laterality: N/A;  . LUMBAR LAMINECTOMY/DECOMPRESSION MICRODISCECTOMY Right 10/11/2016   Procedure: REVISION MICRO LUMBAR DECOMPRESSION L5-S1 RIGHT;  Surgeon: Susa Day, MD;  [facility];  Service: Orthopedics;  Laterality: Right;  . NOVASURE ABLATION  01/08/2008    Current Outpatient Medications  Medication Sig Dispense Refill  . acetaminophen (TYLENOL) 500 MG tablet Take 1,000 mg by mouth every 6 (six) hours as needed for moderate pain or headache.    Marland Kitchen aspirin EC 81 MG EC tablet Take 1 tablet (81 mg total) by mouth daily. 30 tablet 3  . atorvastatin (LIPITOR) 80 MG tablet Take 1 tablet (80 mg total) by mouth daily at 6  PM. 30 tablet 3  . gabapentin (NEURONTIN) 300 MG capsule TAKE 1 CAPSULE BY MOUTH THREE TIMES A DAY AS NEEDED.  1  . insulin degludec (TRESIBA) 100 UNIT/ML SOPN FlexTouch Pen Inject 40 units SQ in the morning    . insulin lispro (HUMALOG) 100 UNIT/ML injection Inject 20 Units into the skin daily before supper.     Marland Kitchen lisinopril (PRINIVIL,ZESTRIL) 20 MG tablet Take 1 tablet (20 mg total) by mouth 2 (two) times daily. 180 tablet 1  . metoprolol succinate (TOPROL-XL) 50 MG 24 hr tablet Take 1 tablet (50 mg total) by mouth daily.    . nitroGLYCERIN (NITROSTAT) 0.4 MG SL tablet Place 1 tablet (0.4 mg total) under the tongue every 5 (five) minutes x 3 doses as needed for chest pain. 25 tablet 12  . ticagrelor (BRILINTA) 90 MG TABS tablet Take 1 tablet (90 mg total) by mouth 2 (two) times daily. 60 tablet 3   No current facility-administered medications for this visit.     Family History  Problem Relation Age of Onset  . Heart disease Mother   . Diabetes Mother   . Hypertension Father   . Diabetes Father   . Kidney failure Father   . Stroke Father   . Hypertension Paternal Grandmother   . Diabetes Paternal Grandmother   . Stroke Paternal Grandmother   . Diabetes Maternal Grandmother   . Ovarian cancer Maternal Grandmother   . Diabetes Paternal Grandfather     Review of Systems  Constitutional: Negative.   HENT: Negative.   Eyes: Negative.   Respiratory: Negative.   Cardiovascular: Negative.   Gastrointestinal: Negative.   Endocrine: Negative.   Genitourinary:       Vaginal odor  Musculoskeletal: Negative.   Skin: Negative.   Allergic/Immunologic: Negative.   Neurological: Negative.   Hematological: Negative.   Psychiatric/Behavioral: Negative.     Exam:   BP 122/84 (BP Location: Right Arm, Patient Position: Sitting, Cuff Size: Large)   Pulse 70   Resp 16   Ht 5' 5.5" (1.664 m)   Wt 260 lb (117.9 kg)   LMP 04/20/2018   BMI 42.61 kg/m     General appearance: alert,  cooperative and appears stated age Head: Normocephalic, without obvious abnormality, atraumatic Neck: no adenopathy, supple, symmetrical, trachea midline and thyroid normal to inspection and palpation Lungs: clear to auscultation bilaterally Breasts: normal appearance, no masses or tenderness, No nipple retraction or dimpling, No nipple discharge or bleeding, No axillary or supraclavicular adenopathy Heart: regular rate and rhythm Abdomen: soft, non-tender; no masses, no organomegaly Extremities: extremities normal, atraumatic, no cyanosis or edema Skin: Skin color, texture, turgor normal. No rashes or lesions Lymph nodes: Cervical, supraclavicular, and axillary nodes normal. No abnormal inguinal nodes palpated Neurologic: Grossly normal  Pelvic: External genitalia:  no lesions              Urethra:  normal appearing urethra with no masses, tenderness or lesions  Bartholins and Skenes: normal                 Vagina: normal appearing vagina with normal color and discharge, no lesions              Cervix: no lesions              Pap taken: Yes.   Bimanual Exam:  Uterus:  normal size, contour, position, consistency, mobility, non-tender              Adnexa: no mass, fullness, tenderness              Rectal exam: Yes.  .  Confirms.              Anus:  normal sphincter tone, no lesions  Chaperone was present for exam.  Assessment:   Well woman visit with normal exam. Hx uterine artery embolization for fibroids. No current contraception.  Hx abnormal pap.  Vaginal odor. STD testing.  Hx anemia.  Hx CAD.  MI.  Has cardiac stents.  On blood thinner, Brilinta. DM.  Not well controlled.  Plan: Mammogram screening. Recommended self breast awareness. Pap and HR HPV as above. Vaginitis testing from pap. Guidelines for Calcium, Vitamin D, regular exercise program including cardiovascular and weight bearing exercise. We discussed that she currently does not have contraception  and that her uterine artery embolization does not provide this.  UPT:Neg TSH and CBC.  Discussed abnormal uterine bleeding and rationale for pelvic ultrasound, sonohysterogram, and endometrial biopsy.  IFOB.   Follow up annually and prn.   After visit summary provided.

## 2018-05-01 NOTE — Patient Instructions (Signed)

## 2018-05-02 LAB — CBC
Hematocrit: 39.1 % (ref 34.0–46.6)
Hemoglobin: 12.6 g/dL (ref 11.1–15.9)
MCH: 29.2 pg (ref 26.6–33.0)
MCHC: 32.2 g/dL (ref 31.5–35.7)
MCV: 91 fL (ref 79–97)
PLATELETS: 357 10*3/uL (ref 150–379)
RBC: 4.32 x10E6/uL (ref 3.77–5.28)
RDW: 13.8 % (ref 12.3–15.4)
WBC: 9.5 10*3/uL (ref 3.4–10.8)

## 2018-05-02 LAB — HEP, RPR, HIV PANEL
HEP B S AG: NEGATIVE
HIV SCREEN 4TH GENERATION: NONREACTIVE
RPR: NONREACTIVE

## 2018-05-02 LAB — HEPATITIS C ANTIBODY: Hep C Virus Ab: <0.1 s/co ratio (ref 0.0–0.9)

## 2018-05-02 LAB — TSH: TSH: 2.27 u[IU]/mL (ref 0.450–4.500)

## 2018-05-04 ENCOUNTER — Telehealth: Payer: Self-pay | Admitting: Obstetrics and Gynecology

## 2018-05-04 NOTE — Telephone Encounter (Signed)
Call placed to patient to review benefit for recommended sonohysterogram and endometrial biopsy. Patient understood information presented. Patient states she will call back to scheduled

## 2018-05-06 LAB — CYTOLOGY - PAP
Bacterial vaginitis: POSITIVE — AB
CHLAMYDIA, DNA PROBE: NEGATIVE
Candida vaginitis: NEGATIVE
DIAGNOSIS: NEGATIVE
HPV: NOT DETECTED
NEISSERIA GONORRHEA: NEGATIVE
Trichomonas: NEGATIVE

## 2018-05-08 ENCOUNTER — Telehealth: Payer: Self-pay | Admitting: *Deleted

## 2018-05-08 MED ORDER — METRONIDAZOLE 500 MG PO TABS: 500.0000 mg | ORAL_TABLET | Freq: Two times a day (BID) | ORAL | 0 refills | Status: DC

## 2018-05-08 NOTE — Telephone Encounter (Signed)
Call to patient. All results reviewed with patient as seen below from Dr. Quincy Simmonds and patient verbalized understanding. Patient requests to treat BV with flagyl as metrogel is too expensive with her insurance. Pharmacy on file confirmed. ETOH precautions reviewed with patient, and patient advised to take flagyl with food. Patient agreeable. Prescription for flagyl 500 mg bid, #14, 0RF sent to pharmacy on file. 02 recall entered into Epic as no annual exam is currently scheduled.   Routing to provider for final review. Patient agreeable to disposition. Will close encounter.

## 2018-05-08 NOTE — Telephone Encounter (Signed)
-----   Message from Nunzio Cobbs, MD sent at 05/08/2018  1:08 PM EDT ----- Please contact patient with results Her vaginitis testing is showing bacterial vaginosis.  She may treat with Flagyl 500 mg po bid for 7 days or Metrogel pv at hs for 5 nights.  Please send Rx to pharmacy of choice. ETOH precautions.   Remaining testing is negative for GC/CT/trich/yeast.  Her pap and is normal and HR HPV is negative.  Pap recall - 02.  Testing is negative for HIV, syphilis, hepatitis B and C.   CBC and TSH are normal.

## 2018-05-11 NOTE — Telephone Encounter (Signed)
Call placed to patient to follow up and schedule recommended sonohysterogram and endometrial biopsy. Left voicemail message requesting a return call.

## 2018-05-21 NOTE — Telephone Encounter (Signed)
Third call placed to patient to follow up and schedule recommended sonoysterogram and endometrial biopsy. Left voicemail message requesting a return call

## 2018-05-26 ENCOUNTER — Ambulatory Visit: Payer: 59 | Admitting: Physician Assistant

## 2018-05-26 NOTE — Telephone Encounter (Signed)
Call placed to patient to follow up and schedule recommended sonohysterogram and endometrial biopsy. Left voicemail message requesting a return call   cc: Lamont Snowball, RN

## 2018-05-28 NOTE — Telephone Encounter (Signed)
See previous phone notes. Multiple calls have been placed to patient to schedule recommended sonohysterogram. Patient has not returned calls to schedule. Forwarding to Conservation officer, historic buildings to review  Routing to Lamont Snowball, RN

## 2018-05-28 NOTE — Telephone Encounter (Signed)
Call to patient. Per DPR, can leave message on voice mail.  Left message,calling to follow-up on recommended sonohysterogram. Left message to call back to nursing supervisor for assistance with scheduling.

## 2018-06-10 ENCOUNTER — Encounter: Payer: Self-pay | Admitting: *Deleted

## 2018-06-10 NOTE — Telephone Encounter (Signed)
Letter to Dr.Silva for review. °

## 2018-06-10 NOTE — Telephone Encounter (Signed)
I signed the letter and will return it to you.  Thank you.

## 2018-06-10 NOTE — Telephone Encounter (Signed)
Patient has been notified of benefits and declined to schedule.  Has not responded to 4 separate attempts to reach patient regarding scheduling since initial call on 05-04-18.  Will send letter to provider for review.

## 2018-06-11 NOTE — Telephone Encounter (Signed)
Letter mailed. Encounter closed.  

## 2018-07-21 ENCOUNTER — Ambulatory Visit: Payer: 59 | Admitting: Physician Assistant

## 2018-07-21 DIAGNOSIS — R0989 Other specified symptoms and signs involving the circulatory and respiratory systems: Secondary | ICD-10-CM

## 2018-07-23 ENCOUNTER — Encounter: Payer: Self-pay | Admitting: *Deleted

## 2018-09-14 ENCOUNTER — Encounter (HOSPITAL_COMMUNITY): Payer: Self-pay | Admitting: Emergency Medicine

## 2018-09-14 ENCOUNTER — Other Ambulatory Visit: Payer: Self-pay | Admitting: Obstetrics and Gynecology

## 2018-09-14 ENCOUNTER — Ambulatory Visit (HOSPITAL_COMMUNITY)
Admission: EM | Admit: 2018-09-14 | Discharge: 2018-09-14 | Disposition: A | Discharge status: Home / Self Care | Payer: Self-pay | Attending: Physician Assistant | Admitting: Physician Assistant

## 2018-09-14 DIAGNOSIS — B373 Candidiasis of vulva and vagina: Secondary | ICD-10-CM

## 2018-09-14 DIAGNOSIS — Z1231 Encounter for screening mammogram for malignant neoplasm of breast: Secondary | ICD-10-CM

## 2018-09-14 DIAGNOSIS — B3731 Acute candidiasis of vulva and vagina: Secondary | ICD-10-CM

## 2018-09-14 MED ORDER — FLUCONAZOLE 150 MG PO TABS: ORAL_TABLET | ORAL | 1 refills | Status: DC

## 2018-09-14 NOTE — ED Triage Notes (Signed)
Pt states her blood sugars have been up and down which gives her yeast infections often. Pt c/o vaginal itching.

## 2018-09-14 NOTE — ED Provider Notes (Signed)
[facility]    CSN: [id number] Arrival date & time: 09/14/18  3785     History   Chief Complaint Chief Complaint  Patient presents with  . Vaginal Itching    HPI Erica Nguyen is a 47 y.o. female.   The history is provided by the patient. No language interpreter was used.  Vaginal Itching  This is a new problem. The current episode started more than 2 days ago. The problem occurs constantly. The problem has been gradually worsening. Nothing aggravates the symptoms. Nothing relieves the symptoms. She has tried nothing for the symptoms. The treatment provided no relief.  Pt is diabetic and recently had a cortisone shot.  Pt reports her glucose has been high and when her glucose is high she gets yeast infections.    Past Medical History:  Diagnosis Date  . Abnormal Pap smear   . CAD (coronary artery disease)   . Diabetes mellitus    insulin-dependent type 2  . Family history of adverse reaction to anesthesia    sister has n/v  . Fibroid   . HNP (herniated nucleus pulposus)   . Hypertension    chronic hypertension    Patient Active Problem List   Diagnosis Date Noted  . Acute non Q wave myocardial infarction ([place]) 07/20/2017  . Ambulatory dysfunction   . Intractable pain   . Uncontrolled type 2 diabetes mellitus without complication, with long-term current use of insulin ([place])   . Sciatica of right side   . Hyperglycemia 10/09/2016  . Lumbar herniated disc 10/09/2016  . Myelopathy ([place]) 10/09/2016  . Spinal stenosis of lumbar region 11/22/2015  . Fibroids, intramural 06/27/2014  . Chest pain 01/06/2012  . Dyspnea 01/06/2012  . Controlled type 2 diabetes mellitus with hyperglycemia ([place]) 09/25/2007  . Essential hypertension 09/25/2007    Past Surgical History:  Procedure Laterality Date  . CARDIAC CATHETERIZATION  02/29/2008   Normal coronary arteries -- Normal LV (left ventricular) systolic function -- Mild to moderate elevation in left ventricular  end-diastolic  pressure secondary to hypertension, diabetes and obesity  . CHOLECYSTECTOMY  1995  . COLPOSCOPY    . CORONARY STENT INTERVENTION N/A 07/21/2017   Procedure: Coronary Stent Intervention;  Surgeon: [name], MD;  Location: [facility];  Service: Cardiovascular;  Laterality: N/A;  . DECOMPRESSIVE LUMBAR LAMINECTOMY LEVEL 1 Right 11/22/2015   Procedure: DECOMPRESSIVE LUMBAR LAMINECTOMY L5-S1 ON RIGHT,FORAMINOTOMIES L5,S1 DISCECTOMY L5,S1;  Surgeon: Susa Day, MD;  Location: [facility];  Service: Orthopedics;  Laterality: Right;  . GYNECOLOGIC CRYOSURGERY  1998  . Hysteroscopy, D&C, Novasure ablation, removal of Intrauterine device  01/08/2008  . LEFT HEART CATH AND CORONARY ANGIOGRAPHY N/A 07/21/2017   Procedure: Left Heart Cath and Coronary Angiography;  Surgeon: [name], MD;  Location: [facility];  Service: Cardiovascular;  Laterality: N/A;  . LUMBAR LAMINECTOMY/DECOMPRESSION MICRODISCECTOMY Right 10/11/2016   Procedure: REVISION MICRO LUMBAR DECOMPRESSION L5-S1 RIGHT;  Surgeon: Susa Day, MD;  Location: [facility];  Service: Orthopedics;  Laterality: Right;  . NOVASURE ABLATION  01/08/2008    OB History    Gravida  3   Para  1   Term  1   Preterm      AB  2   Living  1     SAB  2   TAB      Ectopic      Multiple      Live Births  1  Home Medications    Prior to Admission medications   Medication Sig Start Date End Date Taking? Authorizing Provider  acetaminophen (TYLENOL) 500 MG tablet Take 1,000 mg by mouth every 6 (six) hours as needed for moderate pain or headache.    [provider]  aspirin EC 81 MG EC tablet Take 1 tablet (81 mg total) by mouth daily. 07/24/17   [name], MD  atorvastatin (LIPITOR) 80 MG tablet Take 1 tablet (80 mg total) by mouth daily at 6 PM. 07/23/17   [name], MD  fluconazole (DIFLUCAN) 150 MG tablet Take one tablet by mouth.  Repeat in 3 days if symptoms persist 09/14/18    Caryl Ada K, PA-C  gabapentin (NEURONTIN) 300 MG capsule TAKE 1 CAPSULE BY MOUTH THREE TIMES A DAY AS NEEDED. 02/20/18   [provider]  ibuprofen (ADVIL,MOTRIN) 800 MG tablet Take 1 tablet (800 mg total) by mouth every 8 (eight) hours as needed. 05/01/18   Nunzio Cobbs, MD  insulin degludec (TRESIBA) 100 UNIT/ML SOPN FlexTouch Pen Inject 40 units SQ in the morning 05/27/17   [provider]  insulin lispro (HUMALOG) 100 UNIT/ML injection Inject 20 Units into the skin daily before supper.     [provider]  lisinopril (PRINIVIL,ZESTRIL) 20 MG tablet Take 1 tablet (20 mg total) by mouth 2 (two) times daily. 04/27/18   Almyra Deforest, PA  metoprolol succinate (TOPROL-XL) 50 MG 24 hr tablet Take 1 tablet (50 mg total) by mouth daily. 01/27/18   Almyra Deforest, PA  metroNIDAZOLE (FLAGYL) 500 MG tablet Take 1 tablet (500 mg total) by mouth 2 (two) times daily. Patient not taking: Reported on 09/14/2018 05/08/18   Nunzio Cobbs, MD  nitroGLYCERIN (NITROSTAT) 0.4 MG SL tablet Place 1 tablet (0.4 mg total) under the tongue every 5 (five) minutes x 3 doses as needed for chest pain. 07/23/17   [name], MD  ticagrelor (BRILINTA) 90 MG TABS tablet Take 1 tablet (90 mg total) by mouth 2 (two) times daily. 07/23/17   [name], MD    Family History Family History  Problem Relation Age of Onset  . Heart disease Mother   . Diabetes Mother   . Hypertension Father   . Diabetes Father   . Kidney failure Father   . Stroke Father   . Hypertension Paternal Grandmother   . Diabetes Paternal Grandmother   . Stroke Paternal Grandmother   . Diabetes Maternal Grandmother   . Ovarian cancer Maternal Grandmother   . Diabetes Paternal Grandfather     Social History Social History   Tobacco Use  . Smoking status: Never Smoker  . Smokeless tobacco: Never Used  Substance Use Topics  . Alcohol use: No  . Drug use: No     Allergies   Amoxicillin   Review of  Systems Review of Systems  All other systems reviewed and are negative.    Physical Exam Triage Vital Signs ED Triage Vitals [09/14/18 1930]  Enc Vitals Group     BP (!) 161/76     Pulse Rate 79     Resp 16     Temp 97.9 F (36.6 C)     Temp src      SpO2 100 %     Weight      Height      Head Circumference      Peak Flow      Pain Score      Pain  Loc      Pain Edu?      Excl. in [place]?    No data found.  Updated Vital Signs BP (!) 161/76   Pulse 79   Temp 97.9 F (36.6 C)   Resp 16   LMP 08/23/2018   SpO2 100%   Visual Acuity Right Eye Distance:   Left Eye Distance:   Bilateral Distance:    Right Eye Near:   Left Eye Near:    Bilateral Near:     Physical Exam  Constitutional: She appears well-developed and well-nourished.  HENT:  Head: Normocephalic.  Cardiovascular: Normal rate.  Pulmonary/Chest: Effort normal.  Neurological: She is alert.  Skin: Skin is warm.  Psychiatric: She has a normal mood and affect.  Nursing note and vitals reviewed.    UC Treatments / Results  Labs (all labs ordered are listed, but only abnormal results are displayed) Labs Reviewed - No data to display  EKG None  Radiology No results found.  Procedures Procedures (including critical care time)  Medications Ordered in UC Medications - No data to display  Initial Impression / Assessment and Plan / UC Course  I have reviewed the triage vital signs and the nursing notes.  Pertinent labs & imaging results that were available during my care of the patient were reviewed by me and considered in my medical decision making (see chart for details).     Pt declined pelvic,  No std risk.  Final Clinical Impressions(s) / UC Diagnoses   Final diagnoses:  Yeast vaginitis   Discharge Instructions   None    ED Prescriptions    Medication Sig Dispense Auth. Provider   fluconazole (DIFLUCAN) 150 MG tablet Take one tablet by mouth.  Repeat in 3 days if symptoms persist  2 tablet Fransico Meadow, [place]     Controlled Substance Prescriptions [place] Controlled Substance Registry consulted? Not Applicable  An After Visit Summary was printed and given to the patient.   Fransico Meadow, [place] 09/14/18 2007

## 2018-10-08 ENCOUNTER — Other Ambulatory Visit: Payer: Self-pay | Admitting: Cardiology

## 2018-10-08 DIAGNOSIS — I2510 Atherosclerotic heart disease of native coronary artery without angina pectoris: Secondary | ICD-10-CM

## 2018-10-09 ENCOUNTER — Ambulatory Visit: Payer: Self-pay

## 2018-10-22 DIAGNOSIS — E1165 Type 2 diabetes mellitus with hyperglycemia: Secondary | ICD-10-CM | POA: Diagnosis not present

## 2018-10-22 DIAGNOSIS — Z794 Long term (current) use of insulin: Secondary | ICD-10-CM | POA: Diagnosis not present

## 2018-10-22 DIAGNOSIS — E11649 Type 2 diabetes mellitus with hypoglycemia without coma: Secondary | ICD-10-CM | POA: Diagnosis not present

## 2018-11-13 ENCOUNTER — Ambulatory Visit
Admission: RE | Admit: 2018-11-13 | Discharge: 2018-11-13 | Disposition: A | Discharge status: Home / Self Care | Payer: BLUE CROSS/BLUE SHIELD | Source: Ambulatory Visit | Attending: Obstetrics and Gynecology | Admitting: Obstetrics and Gynecology

## 2018-11-13 DIAGNOSIS — Z1231 Encounter for screening mammogram for malignant neoplasm of breast: Secondary | ICD-10-CM

## 2018-11-25 ENCOUNTER — Encounter: Payer: Self-pay | Admitting: Obstetrics & Gynecology

## 2018-11-26 ENCOUNTER — Encounter: Payer: Self-pay | Admitting: Podiatry

## 2018-11-26 ENCOUNTER — Ambulatory Visit (INDEPENDENT_AMBULATORY_CARE_PROVIDER_SITE_OTHER): Payer: BLUE CROSS/BLUE SHIELD | Admitting: Podiatry

## 2018-11-26 ENCOUNTER — Ambulatory Visit (INDEPENDENT_AMBULATORY_CARE_PROVIDER_SITE_OTHER): Payer: BLUE CROSS/BLUE SHIELD

## 2018-11-26 ENCOUNTER — Other Ambulatory Visit: Payer: Self-pay | Admitting: Podiatry

## 2018-11-26 DIAGNOSIS — M79671 Pain in right foot: Secondary | ICD-10-CM

## 2018-11-26 DIAGNOSIS — M722 Plantar fascial fibromatosis: Secondary | ICD-10-CM

## 2018-11-26 MED ORDER — DICLOFENAC SODIUM 75 MG PO TBEC: 75.0000 mg | DELAYED_RELEASE_TABLET | Freq: Two times a day (BID) | ORAL | 2 refills | Status: DC

## 2018-11-26 MED ORDER — TRIAMCINOLONE ACETONIDE 10 MG/ML IJ SUSP
10.0000 mg | Freq: Once | INTRAMUSCULAR | Status: AC
  Administered 2018-11-26: 10 mg

## 2018-11-26 NOTE — Progress Notes (Signed)
Subjective:   Patient ID: Erica Nguyen, female   DOB: 47 y.o.   MRN: [id number]   HPI Patient presents stating I am having a lot of pain in my heel of my right foot of the last 3 weeks and I do not remember specific injury and has had occasional bouts with this but nothing to this degree.  Difficult to do a lot of walking   Review of Systems  All other systems reviewed and are negative.       Objective:  Physical Exam  Constitutional: She appears well-developed and well-nourished.  Cardiovascular: Intact distal pulses.  Pulmonary/Chest: Effort normal.  Musculoskeletal: Normal range of motion.  Neurological: She is alert.  Skin: Skin is warm.  Nursing note and vitals reviewed.   Neurovascular status intact muscle strength is adequate range of motion within normal limits with patient noted to have exquisite discomfort plantar aspect right heel at the insertional point tendon into the calcaneus with inflammation fluid buildup around the medial band and depression of the arch noted     Assessment:  Acute plantar fasciitis right with inflammation fluid around the medial band and mild dermatitis left foot localized     Plan:  H&P condition reviewed and injected the plantar fascial 3 Milgram Kenalog 5 Milgram Xylocaine after sterile prep and applied sterile dressing.  Applied fascial brace gave instructions for supportive shoes reduced activity for the next few days and reappoint the next several weeks and recommended creams for the left foot.  X-rays indicate spur no indications of stress fracture arthritis      plant

## 2018-12-09 ENCOUNTER — Ambulatory Visit: Payer: BLUE CROSS/BLUE SHIELD | Admitting: Podiatry

## 2018-12-15 ENCOUNTER — Ambulatory Visit: Payer: BLUE CROSS/BLUE SHIELD | Admitting: Physician Assistant

## 2019-01-01 ENCOUNTER — Encounter: Payer: Self-pay | Admitting: Physician Assistant

## 2019-01-01 ENCOUNTER — Encounter (INDEPENDENT_AMBULATORY_CARE_PROVIDER_SITE_OTHER): Payer: Self-pay

## 2019-01-01 ENCOUNTER — Ambulatory Visit (INDEPENDENT_AMBULATORY_CARE_PROVIDER_SITE_OTHER): Payer: BLUE CROSS/BLUE SHIELD | Admitting: Physician Assistant

## 2019-01-01 VITALS — BP 150/80 | HR 96 | Ht 65.5 in | Wt 260.4 lb

## 2019-01-01 DIAGNOSIS — R42 Dizziness and giddiness: Secondary | ICD-10-CM

## 2019-01-01 DIAGNOSIS — I2510 Atherosclerotic heart disease of native coronary artery without angina pectoris: Secondary | ICD-10-CM

## 2019-01-01 DIAGNOSIS — E119 Type 2 diabetes mellitus without complications: Secondary | ICD-10-CM

## 2019-01-01 DIAGNOSIS — R072 Precordial pain: Secondary | ICD-10-CM | POA: Diagnosis not present

## 2019-01-01 DIAGNOSIS — E785 Hyperlipidemia, unspecified: Secondary | ICD-10-CM | POA: Diagnosis not present

## 2019-01-01 MED ORDER — MECLIZINE HCL 25 MG PO CHEW: 25.0000 mg | CHEWABLE_TABLET | Freq: Every day | ORAL | 0 refills | Status: DC

## 2019-01-01 MED ORDER — ROSUVASTATIN CALCIUM 40 MG PO TABS: 40.0000 mg | ORAL_TABLET | Freq: Every day | ORAL | 3 refills | Status: DC

## 2019-01-01 NOTE — Progress Notes (Signed)
Cardiology Office Note    Date:  01/01/2019   ID:  ANALLELY ROSELL, DOB [birth year], MRN [id number]  PCP:  Elisabeth Cara, PA-C  Cardiologist: Dr. Stanford Breed  Chief Complaint  Patient presents with  . Follow-up    seen for Dr. Stanford Breed.   . Chest Pain    sharpe pain, weekly  . Dizziness    weekly    History of Present Illness:  ALIHA DIEDRICH is a 48 y.o. female with PMH of HTN, HLD, DM and ICM with improved EF.  Patient was admitted in July 2018 with NSTEMI.  She was cared for by Dr. Terrence Dupont at the time.  Cardiac catheterization showed EF 35 to 45%, 75% stenosis in the RCA, 75% stenosis in distal LAD.  She had DES placed in both LAD and RCA.  She has since followed with CHF and GI heart care clinic.  Repeat echocardiogram obtained in December 2018 showed EF has improved to 50 to 55%, grade 2 DD, PA peak pressure 35 mmHg.   Patient presents today for cardiology office evaluation.  She did not take her morning lisinopril today, her blood pressure is high at 150/80.  I will hold off on adjusting her blood pressure medication today.  She has been noticing some dizziness, this occur both in the morning when she first get up and occasionally when she is in a sitting position as well.  She describes the sensation of the room spinning.  I will start her on a trial of meclizine for the next month.  Her LDL is still not at goal, I will change her Lipitor to Crestor 40 mg daily and repeat a fasting lipid panel and LFT in 2 months.  Her main issue today is occasional left-sided chest pressure.  This also radiates down the left shoulder as well.  It typically occur at rest and it does not occur with exertion.  I plan to obtain outpatient stress test in this case to rule out a significant blockage.  If the stress test is normal, she can return to reassess the symptom in 6 months.  If the stress test is abnormal, we plan to bring the patient back early.  EKG today does not show any obvious  changes.   Past Medical History:  Diagnosis Date  . Abnormal Pap smear   . CAD (coronary artery disease)   . Diabetes mellitus    insulin-dependent type 2  . Family history of adverse reaction to anesthesia    sister has n/v  . Fibroid   . HNP (herniated nucleus pulposus)   . Hypertension    chronic hypertension    Past Surgical History:  Procedure Laterality Date  . CARDIAC CATHETERIZATION  02/29/2008   Normal coronary arteries -- Normal LV (left ventricular) systolic function -- Mild to moderate elevation in left ventricular end-diastolic  pressure secondary to hypertension, diabetes and obesity  . CHOLECYSTECTOMY  1995  . COLPOSCOPY    . CORONARY STENT INTERVENTION N/A 07/21/2017   Procedure: Coronary Stent Intervention;  Surgeon: [name], MD;  Location: [place] [facility];  Service: Cardiovascular;  Laterality: N/A;  . DECOMPRESSIVE LUMBAR LAMINECTOMY LEVEL 1 Right 11/22/2015   Procedure: DECOMPRESSIVE LUMBAR LAMINECTOMY L5-S1 ON RIGHT,FORAMINOTOMIES L5,S1 DISCECTOMY L5,S1;  Surgeon: Susa Day, MD;  Location: [facility];  Service: Orthopedics;  Laterality: Right;  . GYNECOLOGIC CRYOSURGERY  1998  . Hysteroscopy, D&C, Novasure ablation, removal of Intrauterine device  01/08/2008  . LEFT HEART CATH AND CORONARY ANGIOGRAPHY N/A  07/21/2017   Procedure: Left Heart Cath and Coronary Angiography;  Surgeon: [name], MD;  Location: [place] [place] [facility];  Service: Cardiovascular;  Laterality: N/A;  . LUMBAR LAMINECTOMY/DECOMPRESSION MICRODISCECTOMY Right 10/11/2016   Procedure: REVISION MICRO LUMBAR DECOMPRESSION L5-S1 RIGHT;  Surgeon: Susa Day, MD;  Location: [facility];  Service: Orthopedics;  Laterality: Right;  . NOVASURE ABLATION  01/08/2008    Current Medications: Outpatient Medications Prior to Visit  Medication Sig Dispense Refill  . acetaminophen (TYLENOL) 500 MG tablet Take 1,000 mg by mouth every 6 (six) hours as needed for moderate pain or headache.    Marland Kitchen  aspirin EC 81 MG EC tablet Take 1 tablet (81 mg total) by mouth daily. 30 tablet 3  . diclofenac (VOLTAREN) 75 MG EC tablet Take 1 tablet (75 mg total) by mouth 2 (two) times daily. 50 tablet 2  . empagliflozin (JARDIANCE) 10 MG TABS tablet Take by mouth.    Marland Kitchen glucose blood (PRECISION QID TEST) test strip For blood sugar monitoring 3x daily with the One Touch Verio Flex glucometer    . insulin degludec (TRESIBA) 100 UNIT/ML SOPN FlexTouch Pen Inject 40 units SQ in the morning    . insulin lispro (HUMALOG) 100 UNIT/ML injection Inject 20 Units into the skin daily before supper.     Marland Kitchen lisinopril (PRINIVIL,ZESTRIL) 40 MG tablet Take 40 mg by mouth daily.    . metoprolol succinate (TOPROL-XL) 50 MG 24 hr tablet Take 1 tablet (50 mg total) by mouth daily.    . metroNIDAZOLE (FLAGYL) 500 MG tablet Take 1 tablet (500 mg total) by mouth 2 (two) times daily. 14 tablet 0  . nitroGLYCERIN (NITROSTAT) 0.4 MG SL tablet Place 1 tablet (0.4 mg total) under the tongue every 5 (five) minutes x 3 doses as needed for chest pain. 25 tablet 12  . ticagrelor (BRILINTA) 90 MG TABS tablet Take 1 tablet (90 mg total) by mouth 2 (two) times daily. 60 tablet 3  . atorvastatin (LIPITOR) 80 MG tablet Take 1 tablet (80 mg total) by mouth daily at 6 PM. 30 tablet 3  . fluconazole (DIFLUCAN) 150 MG tablet Take one tablet by mouth.  Repeat in 3 days if symptoms persist 2 tablet 1  . gabapentin (NEURONTIN) 300 MG capsule TAKE 1 CAPSULE BY MOUTH THREE TIMES A DAY AS NEEDED.  1  . ibuprofen (ADVIL,MOTRIN) 800 MG tablet Take 1 tablet (800 mg total) by mouth every 8 (eight) hours as needed. 30 tablet 2  . lisinopril (PRINIVIL,ZESTRIL) 20 MG tablet Take 1 tablet (20 mg total) by mouth 2 (two) times daily. 180 tablet 1  . metoprolol succinate (TOPROL-XL) 25 MG 24 hr tablet Take 2 tablets (50 mg total) by mouth daily. 180 tablet 1  . metoprolol tartrate (LOPRESSOR) 50 MG tablet Take by mouth.     No facility-administered medications  prior to visit.      Allergies:   Amoxicillin   Social History   Socioeconomic History  . Marital status: Single    Spouse name: Not on file  . Number of children: 1  . Years of education: Not on file  . Highest education level: Not on file  Occupational History  . Occupation: Airline pilot, Medical illustrator  Social Needs  . Financial resource strain: Not on file  . Food insecurity:    Worry: Not on file    Inability: Not on file  . Transportation needs:    Medical: Not on file    Non-medical: Not on file  Tobacco Use  . Smoking status: Never Smoker  . Smokeless tobacco: Never Used  Substance and Sexual Activity  . Alcohol use: No  . Drug use: No  . Sexual activity: Yes    Birth control/protection: Surgical    Comment: ablation  Lifestyle  . Physical activity:    Days per week: Not on file    Minutes per session: Not on file  . Stress: Not on file  Relationships  . Social connections:    Talks on phone: Not on file    Gets together: Not on file    Attends religious service: Not on file    Active member of club or organization: Not on file    Attends meetings of clubs or organizations: Not on file    Relationship status: Not on file  Other Topics Concern  . Not on file  Social History Narrative  . Not on file     Family History:  The patient's family history includes Diabetes in her father, maternal grandmother, mother, paternal grandfather, and paternal grandmother; Heart disease in her mother; Hypertension in her father and paternal grandmother; Kidney failure in her father; Ovarian cancer in her maternal grandmother; Stroke in her father and paternal grandmother.   ROS:   Please see the history of present illness.    ROS All other systems reviewed and are negative.   PHYSICAL EXAM:   VS:  BP (!) 150/80   Pulse 96   Ht 5' 5.5" (1.664 m)   Wt 260 lb 6.4 oz (118.1 kg)   BMI 42.67 kg/m    GEN: Well nourished, well developed, in no acute distress  HEENT: normal  Neck:  no JVD, carotid bruits, or masses Cardiac: RRR; no murmurs, rubs, or gallops,no edema  Respiratory:  clear to auscultation bilaterally, normal work of breathing GI: soft, nontender, nondistended, + BS MS: no deformity or atrophy  Skin: warm and dry, no rash Neuro:  Alert and Oriented x 3, Strength and sensation are intact Psych: euthymic mood, full affect  Wt Readings from Last 3 Encounters:  01/01/19 260 lb 6.4 oz (118.1 kg)  05/01/18 260 lb (117.9 kg)  01/27/18 255 lb (115.7 kg)      Studies/Labs Reviewed:   EKG:  EKG is ordered today.  The ekg ordered today demonstrates normal sinus rhythm without significant ST-T wave changes.  Recent Labs: 05/01/2018: Hemoglobin 12.6; Platelets 357; TSH 2.270   Lipid Panel No results found for: CHOL, TRIG, HDL, CHOLHDL, VLDL, LDLCALC, LDLDIRECT  Additional studies/ records that were reviewed today include:   Cath 07/21/2017  Prox LAD lesion, 20 %stenosed.  Mid LAD lesion, 30 %stenosed.  The left ventricular ejection fraction is 35-45% by visual estimate.  LV end diastolic pressure is mildly elevated.  There is moderate left ventricular systolic dysfunction.  A STENT XIENCE ALPINE RX 3.5X18 drug eluting stent was successfully placed.  Mid RCA lesion, 75 %stenosed.  Post intervention, there is a 0% residual stenosis.  A STENT XIENCE ALPINE RX 2.5X8 drug eluting stent was successfully placed, and overlaps previously placed stent.  Dist LAD-2 lesion, 30 %stenosed.  Post intervention, there is a 0% residual stenosis.  Dist LAD-1 lesion, 75 %stenosed.  Post intervention, there is a 0% residual stenosis.  A STENT XIENCE ALPINE RX [id number] drug eluting stent was successfully placed.  A STENT XIENCE ALPINE RX 2.5X8 drug-eluting stent was successfully placed.   Echo 12/04/2017 LV EF: 50% -   55% Study Conclusions  - Left ventricle: The  cavity size was normal. Wall thickness was   normal. Systolic function was normal. The  estimated ejection   fraction was in the range of 50% to 55%. Wall motion was normal;   there were no regional wall motion abnormalities. Features are   consistent with a pseudonormal left ventricular filling pattern,   with concomitant abnormal relaxation and increased filling   pressure (grade 2 diastolic dysfunction). - Pulmonary arteries: Systolic pressure was mildly increased. PA   peak pressure: 35 mm Hg (S).     ASSESSMENT:    1. Precordial pain   2. Hyperlipidemia, unspecified hyperlipidemia type   3. Hyperlipidemia LDL goal <70   4. Controlled type 2 diabetes mellitus without complication, without long-term current use of insulin ([place])   5. Coronary artery disease involving native coronary artery of native heart without angina pectoris   6. Vertigo      PLAN:  In order of problems listed above:  1. Chest pain: Her chest pain is not associated with exertion.  She described as left-sided chest pain radiating to the shoulder.  There is no exacerbating factors such as deep inspiration, body rotation or palpation.  She is still on aspirin and Brilinta.  I recommended outpatient Lexiscan Myoview.  If Myoview is negative, I will discuss with Dr. Stanford Breed to potentially stop her Brilinta since she is at least 1 years out from the stent placement  2. CAD: Aspirin and Brilinta for the time being, potentially remove Brilinta once her stress test come back normal since her stent was placed in July 2018  3. Hypertension: Blood pressure is elevated this morning, however patient did not take her morning lisinopril  4. Hyperlipidemia: Her recent cholesterol lab work obtained at [facility] showed uncontrolled LDL, will switch Lipitor to Crestor 40 mg daily and obtain fasting lipid panel/LFT in 2 months  5. DM2: Managed by primary care provider  6. Vertigo: We will proceed with a trial of meclizine to see if this will help.  Her dizzy spell occur both with change in the body position and  can occur by itself even when the patient is sitting, she described episodes where she feels like the room is spinning.    Medication Adjustments/Labs and Tests Ordered: Current medicines are reviewed at length with the patient today.  Concerns regarding medicines are outlined above.  Medication changes, Labs and Tests ordered today are listed in the Patient Instructions below. Patient Instructions  Medication Instructions:  STOP ATORVASTATIN  START ROSUVASTATIN 40 MG ONCE DAILY If you need a refill on your cardiac medications before your next appointment, please call your pharmacy.   Lab work: Your physician recommends that you return for lab work in: 2 [place] If you have labs (blood work) drawn today and your tests are completely normal, you will receive your results only by: Marland Kitchen MyChart Message (if you have MyChart) OR . A paper copy in the mail If you have any lab test that is abnormal or we need to change your treatment, we will call you to review the results.  Testing/Procedures: Your physician has requested that you have a lexiscan myoview. For further information please visit [email or web address]. Please follow instruction sheet, as given.    Follow-Up: At [facility], you and your health needs are our priority.  As part of our continuing mission to provide you with exceptional heart care, we have created designated Provider Care Teams.  These Care Teams include your primary Cardiologist (physician) and Advanced Practice  Providers (APPs -  Physician Assistants and Nurse Practitioners) who all work together to provide you with the care you need, when you need it. You will need a follow up appointment in 6 months.  Please call our office 2 months in advance to schedule this appointment.  You may see Kirk Ruths, MD or one of the following Advanced Practice Providers on your designated Care Team:   Kerin Ransom, PA-C Roby Lofts, [place] . Sande Rives,  PA-C        Signed, [place], [place]  01/01/2019 9:43 AM    [facility] [address], [place], [place]  [address] Phone: [phone]; Fax: [phone]

## 2019-01-01 NOTE — Patient Instructions (Signed)
Medication Instructions:  STOP ATORVASTATIN  START ROSUVASTATIN 40 MG ONCE DAILY If you need a refill on your cardiac medications before your next appointment, please call your pharmacy.   Lab work: Your physician recommends that you return for lab work in: 2 [place] If you have labs (blood work) drawn today and your tests are completely normal, you will receive your results only by: Marland Kitchen MyChart Message (if you have MyChart) OR . A paper copy in the mail If you have any lab test that is abnormal or we need to change your treatment, we will call you to review the results.  Testing/Procedures: Your physician has requested that you have a lexiscan myoview. For further information please visit [email or web address]. Please follow instruction sheet, as given.    Follow-Up: At [facility], you and your health needs are our priority.  As part of our continuing mission to provide you with exceptional heart care, we have created designated Provider Care Teams.  These Care Teams include your primary Cardiologist (physician) and Advanced Practice Providers (APPs -  Physician Assistants and Nurse Practitioners) who all work together to provide you with the care you need, when you need it. You will need a follow up appointment in 6 months.  Please call our office 2 months in advance to schedule this appointment.  You may see Kirk Ruths, MD or one of the following Advanced Practice Providers on your designated Care Team:   Kerin Ransom, PA-C Roby Lofts, [place] . Sande Rives, PA-C

## 2019-01-05 ENCOUNTER — Telehealth (HOSPITAL_COMMUNITY): Payer: Self-pay

## 2019-01-05 NOTE — Telephone Encounter (Signed)
Encounter complete. 

## 2019-01-07 ENCOUNTER — Ambulatory Visit (HOSPITAL_COMMUNITY)
Admission: RE | Admit: 2019-01-07 | Discharge: 2019-01-07 | Disposition: A | Discharge status: Home / Self Care | Payer: BLUE CROSS/BLUE SHIELD | Source: Ambulatory Visit | Attending: Cardiology | Admitting: Cardiology

## 2019-01-07 DIAGNOSIS — R072 Precordial pain: Secondary | ICD-10-CM | POA: Diagnosis not present

## 2019-01-07 MED ORDER — REGADENOSON 0.4 MG/5ML IV SOLN
0.4000 mg | Freq: Once | INTRAVENOUS | Status: AC
  Administered 2019-01-07: 0.4 mg via INTRAVENOUS

## 2019-01-07 MED ORDER — TECHNETIUM TC 99M TETROFOSMIN IV KIT
30.4000 | PACK | Freq: Once | INTRAVENOUS | Status: AC | PRN
  Administered 2019-01-07: 30.4 via INTRAVENOUS
  Filled 2019-01-07: qty 31

## 2019-01-08 ENCOUNTER — Ambulatory Visit (HOSPITAL_COMMUNITY)
Admission: RE | Admit: 2019-01-08 | Discharge: 2019-01-08 | Disposition: A | Discharge status: Home / Self Care | Payer: BLUE CROSS/BLUE SHIELD | Source: Ambulatory Visit | Attending: Internal Medicine | Admitting: Internal Medicine

## 2019-01-08 LAB — MYOCARDIAL PERFUSION IMAGING
CHL CUP NUCLEAR SSS: 8
LV sys vol: 36 mL
LVDIAVOL: 78 mL (ref 46–106)
Peak HR: 110 {beats}/min
Rest HR: 78 {beats}/min
SDS: 5
SRS: 3
TID: 0.97

## 2019-01-08 MED ORDER — TECHNETIUM TC 99M TETROFOSMIN IV KIT
30.5000 | PACK | Freq: Once | INTRAVENOUS | Status: AC | PRN
  Administered 2019-01-08: 30.5 via INTRAVENOUS

## 2019-01-27 ENCOUNTER — Other Ambulatory Visit: Payer: Self-pay | Admitting: Physician Assistant

## 2019-01-27 DIAGNOSIS — I10 Essential (primary) hypertension: Secondary | ICD-10-CM

## 2019-01-27 DIAGNOSIS — I2510 Atherosclerotic heart disease of native coronary artery without angina pectoris: Secondary | ICD-10-CM

## 2019-02-22 DIAGNOSIS — F4321 Adjustment disorder with depressed mood: Secondary | ICD-10-CM | POA: Diagnosis not present

## 2019-02-22 DIAGNOSIS — Z794 Long term (current) use of insulin: Secondary | ICD-10-CM | POA: Diagnosis not present

## 2019-02-22 DIAGNOSIS — E1165 Type 2 diabetes mellitus with hyperglycemia: Secondary | ICD-10-CM | POA: Diagnosis not present

## 2019-03-08 DIAGNOSIS — F4321 Adjustment disorder with depressed mood: Secondary | ICD-10-CM | POA: Diagnosis not present

## 2019-03-09 DIAGNOSIS — E1165 Type 2 diabetes mellitus with hyperglycemia: Secondary | ICD-10-CM | POA: Diagnosis not present

## 2019-03-09 DIAGNOSIS — E1159 Type 2 diabetes mellitus with other circulatory complications: Secondary | ICD-10-CM | POA: Diagnosis not present

## 2019-03-09 DIAGNOSIS — Z794 Long term (current) use of insulin: Secondary | ICD-10-CM | POA: Diagnosis not present

## 2019-03-17 ENCOUNTER — Telehealth: Payer: Self-pay | Admitting: Cardiology

## 2019-03-17 NOTE — Telephone Encounter (Signed)
Spoke with pt, she got a note from her diabetes doctor for work. She will call back if anything is needed from [place].

## 2019-03-17 NOTE — Telephone Encounter (Signed)
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 She has concerns since she has had a heart attack and has diabetes.

## 2019-03-17 NOTE — Telephone Encounter (Signed)
[place] for note [employer]

## 2019-04-16 ENCOUNTER — Ambulatory Visit
Admission: EM | Admit: 2019-04-16 | Discharge: 2019-04-16 | Disposition: A | Discharge status: Home / Self Care | Payer: BLUE CROSS/BLUE SHIELD | Attending: Physician Assistant | Admitting: Physician Assistant

## 2019-04-16 DIAGNOSIS — E119 Type 2 diabetes mellitus without complications: Secondary | ICD-10-CM | POA: Diagnosis not present

## 2019-04-16 DIAGNOSIS — N898 Other specified noninflammatory disorders of vagina: Secondary | ICD-10-CM | POA: Diagnosis not present

## 2019-04-16 DIAGNOSIS — Z794 Long term (current) use of insulin: Secondary | ICD-10-CM

## 2019-04-16 DIAGNOSIS — L232 Allergic contact dermatitis due to cosmetics: Secondary | ICD-10-CM

## 2019-04-16 MED ORDER — TRIAMCINOLONE ACETONIDE 0.1 % EX CREA: 1.0000 "application " | TOPICAL_CREAM | Freq: Two times a day (BID) | CUTANEOUS | 0 refills | Status: DC

## 2019-04-16 MED ORDER — METRONIDAZOLE 500 MG PO TABS: 500.0000 mg | ORAL_TABLET | Freq: Two times a day (BID) | ORAL | 0 refills | Status: DC

## 2019-04-16 MED ORDER — FLUCONAZOLE 150 MG PO TABS: 150.0000 mg | ORAL_TABLET | Freq: Every day | ORAL | 0 refills | Status: DC

## 2019-04-16 MED ORDER — TRIAMCINOLONE ACETONIDE 0.025 % EX OINT: 1.0000 "application " | TOPICAL_OINTMENT | Freq: Every day | CUTANEOUS | 0 refills | Status: DC

## 2019-04-16 NOTE — Discharge Instructions (Signed)
Start traimcinolone cream as directed. If needed, you can fill triamcinolone ointment to use at night time to help with symptoms. Flagyl and diflucan as directed for BV and yeast. Discontinue new soap. Avoid soap at this time until symptoms improve. If noticing spreading redness, increased warmth, pain, follow up for reevaluation needed. Refrain from sexual activity and alcohol use for the next 7 days. Monitor for any worsening of symptoms, fever, abdominal pain, nausea, vomiting, to follow up for reevaluation.

## 2019-04-16 NOTE — ED Triage Notes (Signed)
Pt states used a new soap 2days ago, has a rash to upper chest/neck and vaginal itching with a odor.

## 2019-04-16 NOTE — ED Provider Notes (Signed)
[name]    CSN: [id number] Arrival date & time: 04/16/19  1600     History   Chief Complaint Chief Complaint  Patient presents with  . Rash    HPI Erica Nguyen is a 48 y.o. female.   48 year old female with history of CAD, insulin dependent DM, HTN comes in for 2-3 day history of itching of the chest with rash, vaginal discharge with odor. She started using a new soap 4-5 days ago. Denies any spreading erythema, warmth, pain. She has since switched to dove soap after symptom started. She denies abdominal pain, nausea, vomiting. Denies fever, chills, night sweats. Denies urinary frequency, dysuria, hematuria. Not currently sexually active. LMP 04/15/2019. History of BV and states odor is consistent. DM uncontrolled, last a1c 13.6 had insulin dosage readjusted recently.      Past Medical History:  Diagnosis Date  . Abnormal Pap smear   . CAD (coronary artery disease)   . Diabetes mellitus    insulin-dependent type 2  . Family history of adverse reaction to anesthesia    sister has n/v  . Fibroid   . HNP (herniated nucleus pulposus)   . Hypertension    chronic hypertension    Patient Active Problem List   Diagnosis Date Noted  . NSTEMI (non-ST elevated myocardial infarction) ([place]) 07/31/2017  . Acute non Q wave myocardial infarction ([place]) 07/20/2017  . Ambulatory dysfunction   . Intractable pain   . Uncontrolled type 2 diabetes mellitus without complication, with long-term current use of insulin ([place])   . Sciatica of right side   . Hyperglycemia 10/09/2016  . Lumbar herniated disc 10/09/2016  . Myelopathy ([place]) 10/09/2016  . Hypercholesterolemia 01/23/2016  . Vitamin D deficiency 01/23/2016  . Vaginal candidiasis 11/29/2015  . Spinal stenosis of lumbar region 11/22/2015  . Obesity, Class II, BMI 35-39.9, with comorbidity 10/15/2015  . Lumbosacral radiculopathy due to osteoarthritis of spine 06/10/2015  . Uncontrolled type 2 diabetes mellitus with  hyperglycemia, with long-term current use of insulin ([place]) 06/10/2015  . Fibroids, intramural 06/27/2014  . Chest pain 01/06/2012  . Dyspnea 01/06/2012  . Controlled type 2 diabetes mellitus with hyperglycemia ([place]) 09/25/2007  . Essential hypertension 09/25/2007    Past Surgical History:  Procedure Laterality Date  . CARDIAC CATHETERIZATION  02/29/2008   Normal coronary arteries -- Normal LV (left ventricular) systolic function -- Mild to moderate elevation in left ventricular end-diastolic  pressure secondary to hypertension, diabetes and obesity  . CHOLECYSTECTOMY  1995  . COLPOSCOPY    . CORONARY STENT INTERVENTION N/A 07/21/2017   Procedure: Coronary Stent Intervention;  Surgeon: [name], MD;  Location: [facility];  Service: Cardiovascular;  Laterality: N/A;  . DECOMPRESSIVE LUMBAR LAMINECTOMY LEVEL 1 Right 11/22/2015   Procedure: DECOMPRESSIVE LUMBAR LAMINECTOMY L5-S1 ON RIGHT,FORAMINOTOMIES L5,S1 DISCECTOMY L5,S1;  Surgeon: Susa Day, MD;  Location: [facility];  Service: Orthopedics;  Laterality: Right;  . GYNECOLOGIC CRYOSURGERY  1998  . Hysteroscopy, D&C, Novasure ablation, removal of Intrauterine device  01/08/2008  . LEFT HEART CATH AND CORONARY ANGIOGRAPHY N/A 07/21/2017   Procedure: Left Heart Cath and Coronary Angiography;  Surgeon: [name], MD;  Location: [facility];  Service: Cardiovascular;  Laterality: N/A;  . LUMBAR LAMINECTOMY/DECOMPRESSION MICRODISCECTOMY Right 10/11/2016   Procedure: REVISION MICRO LUMBAR DECOMPRESSION L5-S1 RIGHT;  Surgeon: Susa Day, MD;  Location: [facility];  Service: Orthopedics;  Laterality: Right;  . NOVASURE ABLATION  01/08/2008    OB History  Gravida  3   Para  1   Term  1   Preterm      AB  2   Living  1     SAB  2   TAB      Ectopic      Multiple      Live Births  1            Home Medications    Prior to Admission medications   Medication Sig Start Date End Date Taking?  Authorizing Provider  acetaminophen (TYLENOL) 500 MG tablet Take 1,000 mg by mouth every 6 (six) hours as needed for moderate pain or headache.    [provider]  aspirin EC 81 MG EC tablet Take 1 tablet (81 mg total) by mouth daily. 07/24/17   [name], MD  diclofenac (VOLTAREN) 75 MG EC tablet Take 1 tablet (75 mg total) by mouth 2 (two) times daily. 11/26/18   Wallene Huh, DPM  empagliflozin (JARDIANCE) 10 MG TABS tablet Take by mouth. 10/22/18   [provider]  fluconazole (DIFLUCAN) 150 MG tablet Take 1 tablet (150 mg total) by mouth daily. Take second dose 72 hours later if symptoms still persists. 04/16/19   Tasia Catchings,  V, PA-C  glucose blood (PRECISION QID TEST) test strip For blood sugar monitoring 3x daily with the One Touch Verio Flex glucometer 10/22/18   [provider]  insulin degludec (TRESIBA) 100 UNIT/ML SOPN FlexTouch Pen Inject 40 units SQ in the morning 05/27/17   [provider]  insulin lispro (HUMALOG) 100 UNIT/ML injection Inject 20 Units into the skin daily before supper.     [provider]  lisinopril (PRINIVIL,ZESTRIL) 40 MG tablet Take 40 mg by mouth daily.    [provider]  Meclizine HCl 25 MG CHEW Chew 1 tablet (25 mg total) by mouth daily. 01/01/19   Almyra Deforest, PA  metoprolol succinate (TOPROL-XL) 50 MG 24 hr tablet Take 1 tablet (50 mg total) by mouth daily. 01/27/18   Almyra Deforest, PA  metroNIDAZOLE (FLAGYL) 500 MG tablet Take 1 tablet (500 mg total) by mouth 2 (two) times daily. 04/16/19   Tasia Catchings,  V, PA-C  nitroGLYCERIN (NITROSTAT) 0.4 MG SL tablet Place 1 tablet (0.4 mg total) under the tongue every 5 (five) minutes x 3 doses as needed for chest pain. 07/23/17   [name], MD  rosuvastatin (CRESTOR) 40 MG tablet Take 1 tablet (40 mg total) by mouth daily. 01/01/19 04/01/19  Almyra Deforest, PA  ticagrelor (BRILINTA) 90 MG TABS tablet Take 1 tablet (90 mg total) by mouth 2 (two) times daily. 07/23/17   [name], MD   triamcinolone (KENALOG) 0.025 % ointment Apply 1 application topically at bedtime. 04/16/19   Tasia Catchings,  V, PA-C  triamcinolone cream (KENALOG) 0.1 % Apply 1 application topically 2 (two) times daily. 04/16/19   Ok Edwards, PA-C    Family History Family History  Problem Relation Age of Onset  . Heart disease Mother   . Diabetes Mother   . Hypertension Father   . Diabetes Father   . Kidney failure Father   . Stroke Father   . Hypertension Paternal Grandmother   . Diabetes Paternal Grandmother   . Stroke Paternal Grandmother   . Diabetes Maternal Grandmother   . Ovarian cancer Maternal Grandmother   . Diabetes Paternal Grandfather     Social History Social History   Tobacco Use  . Smoking status: Never Smoker  . Smokeless tobacco:  Never Used  Substance Use Topics  . Alcohol use: No  . Drug use: No     Allergies   Amoxicillin   Review of Systems Review of Systems  Reason unable to perform ROS: See HPI as above.     Physical Exam Triage Vital Signs ED Triage Vitals  Enc Vitals Group     BP 04/16/19 1609 137/84     Pulse Rate 04/16/19 1609 (!) 101     Resp 04/16/19 1609 18     Temp 04/16/19 1609 99.1 F (37.3 C)     Temp Source 04/16/19 1609 Oral     SpO2 04/16/19 1609 100 %     Weight --      Height --      Head Circumference --      Peak Flow --      Pain Score 04/16/19 1610 0     Pain Loc --      Pain Edu? --      Excl. in [place]? --    No data found.  Updated Vital Signs BP 137/84 (BP Location: Left Arm)   Pulse (!) 101   Temp 99.1 F (37.3 C) (Oral)   Resp 18   LMP 04/15/2019   SpO2 100%   Physical Exam Constitutional:      General: She is not in acute distress.    Appearance: She is well-developed. She is not diaphoretic.  HENT:     Head: Normocephalic and atraumatic.  Eyes:     Conjunctiva/sclera: Conjunctivae normal.     Pupils: Pupils are equal, round, and reactive to light.  Pulmonary:     Effort: Pulmonary effort is normal. No  respiratory distress.  Abdominal:     General: Bowel sounds are normal.     Palpations: Abdomen is soft.     Tenderness: There is no abdominal tenderness. There is no guarding or rebound.  Skin:    General: Skin is warm and dry.     Comments: Skin thickening along the upper chest to the neck. No erythema, warmth. No tenderness to palpation. No vesicles.   Neurological:     Mental Status: She is alert and oriented to person, place, and time.     UC Treatments / Results  Labs (all labs ordered are listed, but only abnormal results are displayed) Labs Reviewed - No data to display  EKG None  Radiology No results found.  Procedures Procedures (including critical care time)  Medications Ordered in UC Medications - No data to display  Initial Impression / Assessment and Plan / UC Course  I have reviewed the triage vital signs and the nursing notes.  Pertinent labs & imaging results that were available during my care of the patient were reviewed by me and considered in my medical decision making (see chart for details).    Uncontrolled DM with last a1c 13.6, will defer prednisone at this time. Triamcinolone cream and ointment as directed. Flagyl and diflucan as directed for BV and yeast. Deferred cytology as patient without concerns for STDs. Other symptomatic treatment discussed. Return precautions given.  Final Clinical Impressions(s) / UC Diagnoses   Final diagnoses:  Allergic contact dermatitis due to cosmetics  Vaginal discharge    ED Prescriptions    Medication Sig Dispense Auth. Provider   metroNIDAZOLE (FLAGYL) 500 MG tablet Take 1 tablet (500 mg total) by mouth 2 (two) times daily. 14 tablet ,  V, PA-C   fluconazole (DIFLUCAN) 150 MG tablet Take 1 tablet (  150 mg total) by mouth daily. Take second dose 72 hours later if symptoms still persists. 2 tablet ,  V, PA-C   triamcinolone cream (KENALOG) 0.1 % Apply 1 application topically 2 (two) times daily. 30 g  ,  V, PA-C   triamcinolone (KENALOG) 0.025 % ointment Apply 1 application topically at bedtime. 30 g Tobin Chad, [place] 04/16/19 1708

## 2019-04-18 ENCOUNTER — Other Ambulatory Visit: Payer: Self-pay | Admitting: Cardiology

## 2019-04-18 DIAGNOSIS — I2510 Atherosclerotic heart disease of native coronary artery without angina pectoris: Secondary | ICD-10-CM

## 2019-04-19 NOTE — Telephone Encounter (Signed)
Please refill Toprol dose which patient is taking at present. Erica Nguyen

## 2019-04-19 NOTE — Telephone Encounter (Signed)
Please check with pt and see what she is taking and renew [place]

## 2019-06-08 DIAGNOSIS — M25512 Pain in left shoulder: Secondary | ICD-10-CM | POA: Diagnosis not present

## 2019-06-21 ENCOUNTER — Telehealth: Payer: Self-pay | Admitting: *Deleted

## 2019-06-21 NOTE — Telephone Encounter (Signed)
A message was left, re: follow up visit. 

## 2019-06-28 ENCOUNTER — Other Ambulatory Visit: Payer: Self-pay

## 2019-06-28 ENCOUNTER — Ambulatory Visit
Admission: EM | Admit: 2019-06-28 | Discharge: 2019-06-28 | Disposition: A | Discharge status: Home / Self Care | Payer: BC Managed Care – PPO | Attending: Physician Assistant | Admitting: Physician Assistant

## 2019-06-28 DIAGNOSIS — N898 Other specified noninflammatory disorders of vagina: Secondary | ICD-10-CM | POA: Diagnosis not present

## 2019-06-28 MED ORDER — NYSTATIN 100000 UNIT/GM EX CREA: TOPICAL_CREAM | CUTANEOUS | 0 refills | Status: DC

## 2019-06-28 MED ORDER — FLUCONAZOLE 150 MG PO TABS: 150.0000 mg | ORAL_TABLET | Freq: Every day | ORAL | 0 refills | Status: DC

## 2019-06-28 NOTE — ED Triage Notes (Signed)
Pt c/o yeast infection, itching to vaginal area x2-3 day

## 2019-06-28 NOTE — ED Provider Notes (Signed)
[name]    CSN: [id number] Arrival date & time: 06/28/19  1713     History   Chief Complaint Chief Complaint  Patient presents with  . Vaginal Itching    HPI Erica Nguyen is a 48 y.o. female.   48 year old female with history of insulin dependent DM comes in for 2-3 day history of vaginal itching. Denies discharge, spotting. States had higher cbg readings due to eating during the holiday the past 2 days. Also had intercourse 2 days ago and wonders if that could have triggered the symptom. Denies abdominal pain, nausea, vomiting. Denies urinary symptoms such as frequency, dysuria, hematuria. Denies new hygiene product change. LMP 06/25/2019     Past Medical History:  Diagnosis Date  . Abnormal Pap smear   . CAD (coronary artery disease)   . Diabetes mellitus    insulin-dependent type 2  . Family history of adverse reaction to anesthesia    sister has n/v  . Fibroid   . HNP (herniated nucleus pulposus)   . Hypertension    chronic hypertension    Patient Active Problem List   Diagnosis Date Noted  . NSTEMI (non-ST elevated myocardial infarction) ([place]) 07/31/2017  . Acute non Q wave myocardial infarction ([place]) 07/20/2017  . Ambulatory dysfunction   . Intractable pain   . Uncontrolled type 2 diabetes mellitus without complication, with long-term current use of insulin ([place])   . Sciatica of right side   . Hyperglycemia 10/09/2016  . Lumbar herniated disc 10/09/2016  . Myelopathy ([place]) 10/09/2016  . Hypercholesterolemia 01/23/2016  . Vitamin D deficiency 01/23/2016  . Vaginal candidiasis 11/29/2015  . Spinal stenosis of lumbar region 11/22/2015  . Obesity, Class II, BMI 35-39.9, with comorbidity 10/15/2015  . Lumbosacral radiculopathy due to osteoarthritis of spine 06/10/2015  . Uncontrolled type 2 diabetes mellitus with hyperglycemia, with long-term current use of insulin ([place]) 06/10/2015  . Fibroids, intramural 06/27/2014  . Chest pain 01/06/2012  .  Dyspnea 01/06/2012  . Controlled type 2 diabetes mellitus with hyperglycemia ([place]) 09/25/2007  . Essential hypertension 09/25/2007    Past Surgical History:  Procedure Laterality Date  . CARDIAC CATHETERIZATION  02/29/2008   Normal coronary arteries -- Normal LV (left ventricular) systolic function -- Mild to moderate elevation in left ventricular end-diastolic  pressure secondary to hypertension, diabetes and obesity  . CHOLECYSTECTOMY  1995  . COLPOSCOPY    . CORONARY STENT INTERVENTION N/A 07/21/2017   Procedure: Coronary Stent Intervention;  Surgeon: [name], MD;  Location: [facility] [place];  Service: Cardiovascular;  Laterality: N/A;  . DECOMPRESSIVE LUMBAR LAMINECTOMY LEVEL 1 Right 11/22/2015   Procedure: DECOMPRESSIVE LUMBAR LAMINECTOMY L5-S1 ON RIGHT,FORAMINOTOMIES L5,S1 DISCECTOMY L5,S1;  Surgeon: Susa Day, MD;  Location: [facility];  Service: Orthopedics;  Laterality: Right;  . GYNECOLOGIC CRYOSURGERY  1998  . Hysteroscopy, D&C, Novasure ablation, removal of Intrauterine device  01/08/2008  . LEFT HEART CATH AND CORONARY ANGIOGRAPHY N/A 07/21/2017   Procedure: Left Heart Cath and Coronary Angiography;  Surgeon: [name], MD;  Location: [facility];  Service: Cardiovascular;  Laterality: N/A;  . LUMBAR LAMINECTOMY/DECOMPRESSION MICRODISCECTOMY Right 10/11/2016   Procedure: REVISION MICRO LUMBAR DECOMPRESSION L5-S1 RIGHT;  Surgeon: Susa Day, MD;  Location: [facility];  Service: Orthopedics;  Laterality: Right;  . NOVASURE ABLATION  01/08/2008    OB History    Gravida  3   Para  1   Term  1   Preterm      AB  2   Living  1     SAB  2   TAB      Ectopic      Multiple      Live Births  1            Home Medications    Prior to Admission medications   Medication Sig Start Date End Date Taking? Authorizing Provider  acetaminophen (TYLENOL) 500 MG tablet Take 1,000 mg by mouth every 6 (six) hours as needed for moderate pain or headache.     [provider]  aspirin EC 81 MG EC tablet Take 1 tablet (81 mg total) by mouth daily. 07/24/17   [name], MD  diclofenac (VOLTAREN) 75 MG EC tablet Take 1 tablet (75 mg total) by mouth 2 (two) times daily. 11/26/18   Wallene Huh, DPM  empagliflozin (JARDIANCE) 10 MG TABS tablet Take by mouth. 10/22/18   [provider]  fluconazole (DIFLUCAN) 150 MG tablet Take 1 tablet (150 mg total) by mouth daily. Take second dose 72 hours later if symptoms still persists. 06/28/19   Tasia Catchings, Amy V, PA-C  glucose blood (PRECISION QID TEST) test strip For blood sugar monitoring 3x daily with the One Touch Verio Flex glucometer 10/22/18   [provider]  insulin degludec (TRESIBA) 100 UNIT/ML SOPN FlexTouch Pen Inject 40 units SQ in the morning 05/27/17   [provider]  insulin lispro (HUMALOG) 100 UNIT/ML injection Inject 20 Units into the skin daily before supper.     [provider]  lisinopril (PRINIVIL,ZESTRIL) 40 MG tablet Take 40 mg by mouth daily.    [provider]  Meclizine HCl 25 MG CHEW Chew 1 tablet (25 mg total) by mouth daily. 01/01/19   Almyra Deforest, PA  metoprolol succinate (TOPROL-XL) 50 MG 24 hr tablet Take 1 tablet (50 mg total) by mouth daily. 01/27/18   Almyra Deforest, PA  nitroGLYCERIN (NITROSTAT) 0.4 MG SL tablet Place 1 tablet (0.4 mg total) under the tongue every 5 (five) minutes x 3 doses as needed for chest pain. 07/23/17   [name], MD  nystatin cream (MYCOSTATIN) Apply to affected area 2 times daily 06/28/19   Tasia Catchings, Amy V, PA-C  rosuvastatin (CRESTOR) 40 MG tablet Take 1 tablet (40 mg total) by mouth daily. 01/01/19 04/01/19  Almyra Deforest, PA  ticagrelor (BRILINTA) 90 MG TABS tablet Take 1 tablet (90 mg total) by mouth 2 (two) times daily. 07/23/17   [name], MD  triamcinolone (KENALOG) 0.025 % ointment Apply 1 application topically at bedtime. 04/16/19   Tasia Catchings, Amy V, PA-C  triamcinolone cream (KENALOG) 0.1 % Apply 1 application topically 2  (two) times daily. 04/16/19   Ok Edwards, PA-C    Family History Family History  Problem Relation Age of Onset  . Heart disease Mother   . Diabetes Mother   . Hypertension Father   . Diabetes Father   . Kidney failure Father   . Stroke Father   . Hypertension Paternal Grandmother   . Diabetes Paternal Grandmother   . Stroke Paternal Grandmother   . Diabetes Maternal Grandmother   . Ovarian cancer Maternal Grandmother   . Diabetes Paternal Grandfather     Social History Social History   Tobacco Use  . Smoking status: Never Smoker  . Smokeless tobacco: Never Used  Substance Use Topics  . Alcohol use: No  . Drug use: No     Allergies   Amoxicillin   Review of Systems Review  of Systems  Reason unable to perform ROS: See HPI as above.     Physical Exam Triage Vital Signs ED Triage Vitals  Enc Vitals Group     BP 06/28/19 1725 (!) 150/81     Pulse Rate 06/28/19 1725 85     Resp 06/28/19 1725 16     Temp 06/28/19 1725 97.9 F (36.6 C)     Temp Source 06/28/19 1725 Oral     SpO2 06/28/19 1725 99 %     Weight --      Height --      Head Circumference --      Peak Flow --      Pain Score 06/28/19 1733 0     Pain Loc --      Pain Edu? --      Excl. in [place]? --    No data found.  Updated Vital Signs BP (!) 150/81 (BP Location: Left Arm)   Pulse 85   Temp 97.9 F (36.6 C) (Oral)   Resp 16   LMP 06/25/2019   SpO2 99%    Physical Exam Constitutional:      General: She is not in acute distress.    Appearance: She is well-developed. She is not diaphoretic.  HENT:     Head: Normocephalic and atraumatic.  Eyes:     Conjunctiva/sclera: Conjunctivae normal.     Pupils: Pupils are equal, round, and reactive to light.  Neck:     Musculoskeletal: Normal range of motion and neck supple.  Pulmonary:     Effort: Pulmonary effort is normal. No respiratory distress.  Skin:    General: Skin is warm and dry.  Neurological:     Mental Status: She is alert and  oriented to person, place, and time.    UC Treatments / Results  Labs (all labs ordered are listed, but only abnormal results are displayed) Labs Reviewed - No data to display  EKG   Radiology No results found.  Procedures Procedures (including critical care time)  Medications Ordered in UC Medications - No data to display  Initial Impression / Assessment and Plan / UC Course  I have reviewed the triage vital signs and the nursing notes.  Pertinent labs & imaging results that were available during my care of the patient were reviewed by me and considered in my medical decision making (see chart for details).    Will cover for yeast with diflucan. Patient would also like to try a cream, can use nystatin as needed. Patient to monitor glucose and dose down as hyperglycemia may have been the cause of current episode. Return precautions given.   Final Clinical Impressions(s) / UC Diagnoses   Final diagnoses:  Vaginal itching    ED Prescriptions    Medication Sig Dispense Auth. Provider   fluconazole (DIFLUCAN) 150 MG tablet Take 1 tablet (150 mg total) by mouth daily. Take second dose 72 hours later if symptoms still persists. 2 tablet Yu, Amy V, PA-C   nystatin cream (MYCOSTATIN) Apply to affected area 2 times daily 30 g Tobin Chad, [place] 06/28/19 1757

## 2019-06-28 NOTE — Discharge Instructions (Addendum)
Start diflucan as directed. Nystatin on affected area. Keep area clean and dray. Keep hydrated, urine should be clear to pale yellow in color. Monitor your glucose. Monitor for any worsening of symptoms, fever, abdominal pain, nausea, vomiting, to follow up for reevaluation.  Clotrimazole (jock itch) Miconazole

## 2019-07-19 ENCOUNTER — Ambulatory Visit (INDEPENDENT_AMBULATORY_CARE_PROVIDER_SITE_OTHER): Payer: BC Managed Care – PPO | Admitting: Family Medicine

## 2019-07-19 DIAGNOSIS — M7502 Adhesive capsulitis of left shoulder: Secondary | ICD-10-CM | POA: Diagnosis not present

## 2019-07-19 DIAGNOSIS — M25512 Pain in left shoulder: Secondary | ICD-10-CM

## 2019-07-19 MED ORDER — TRAMADOL HCL 50 MG PO TABS: 50.0000 mg | ORAL_TABLET | Freq: Four times a day (QID) | ORAL | 0 refills | Status: DC | PRN

## 2019-07-19 NOTE — Patient Instructions (Signed)
Adhesive Capsulitis  Adhesive capsulitis, also called frozen shoulder, causes the shoulder to become stiff and painful to move. This condition happens when there is inflammation of the tendons and ligaments that surround the shoulder joint (shoulder capsule). What are the causes? This condition may be caused by:  An injury to your shoulder joint.  Straining your shoulder.  Not moving your shoulder for a period of time. This can happen if your arm was injured or in a sling.  Long-standing conditions, such as: ? Diabetes. ? Thyroid problems. ? Heart disease. ? Stroke. ? Rheumatoid arthritis. ? Lung disease. In some cases, the cause is not known. What increases the risk? You are more likely to develop this condition if you are:  A woman.  Older than 47 years of age. What are the signs or symptoms? Symptoms of this condition include:  Pain in your shoulder when you move your arm. There may also be pain when parts of your shoulder are touched. The pain may be worse at night or when you are resting.  A sore or aching shoulder.  The inability to move your shoulder normally.  Muscle spasms. How is this diagnosed? This condition is diagnosed with a physical exam and imaging tests, such as an X-ray or MRI. How is this treated? This condition may be treated with:  Treatment of the underlying cause or condition.  Medicine. Medicine may be given to relieve pain, inflammation, or muscle spasms.  Steroid injections into the shoulder joint.  Physical therapy. This involves performing exercises to get the shoulder moving again.  Acupuncture. This is a type of treatment that involves stimulating specific points on your body by inserting thin needles through your skin.  Shoulder manipulation. This is a procedure to move the shoulder into another position. It is done after you are given a medicine to make you fall asleep (general anesthetic). The joint may also be injected with salt  water at high pressure to break down scarring.  Surgery. This may be done in severe cases when other treatments have failed. Although most people recover completely from adhesive capsulitis, some may not regain full shoulder movement. Follow these instructions at home: Managing pain, stiffness, and swelling      If directed, put ice on the injured area: ? Put ice in a plastic bag. ? Place a towel between your skin and the bag. ? Leave the ice on for 20 minutes, 2-3 times per day.  If directed, apply heat to the affected area before you exercise. Use the heat source that your health care provider recommends, such as a moist heat pack or a heating pad. ? Place a towel between your skin and the heat source. ? Leave the heat on for 20-30 minutes. ? Remove the heat if your skin turns bright red. This is especially important if you are unable to feel pain, heat, or cold. You may have a greater risk of getting burned. General instructions  Take over-the-counter and prescription medicines only as told by your health care provider.  If you are being treated with physical therapy, follow instructions from your physical therapist.  Avoid exercises that put a lot of demand on your shoulder, such as throwing. These exercises can make pain worse.  Keep all follow-up visits as told by your health care provider. This is important. Contact a health care provider if:  You develop new symptoms.  Your symptoms get worse. Summary  Adhesive capsulitis, also called frozen shoulder, causes the shoulder to become   stiff and painful to move.  You are more likely to have this condition if you are a woman and over age 40.  It is treated with physical therapy, medicines, and sometimes surgery. This information is not intended to replace advice given to you by your health care provider. Make sure you discuss any questions you have with your health care provider. Document Released: 10/06/2009 Document  Revised: 05/15/2018 Document Reviewed: 05/15/2018 Elsevier Patient Education  2020 [employer]  

## 2019-07-19 NOTE — Progress Notes (Signed)
Erica Nguyen - 48 y.o. female MRN [id number]  Date of birth: 11/30/1971  Office Visit Note: Visit Date: 07/19/2019 PCP: Elisabeth Cara, PA-C Referred by: Belva Bertin, [place], *  Subjective: Chief Complaint  Patient presents with  . Left Shoulder - Pain    Pain x approximately 2 weeks, worsened over the past 2 weeks. Hurts down the arm. Decreased ROM. Pain wakes her from sleep. NKI.   HPI: Erica Nguyen is a 48 y.o. female who comes in today with left shoulder pain.  Reports that pain has been present for the past month, significantly worsening in the past 2 weeks. Pain hurts with all movements, especially when reaching behind her. Cannot sleep on shoulder due to pain- waking up every night. Pain is over general shoulder, with shooting pains down left arm. No numbness. No acute event or injury. No new activities.  Has insulin dependent T1DM. Recent Hgb A1C ~ 8. ROS Otherwise per HPI.  Assessment & Plan: Visit Diagnoses:  1. Acute pain of left shoulder   2. Adhesive capsulitis of left shoulder   48 yo female with T2DM, shoulder pain x 1 month with limited active and passive ROM secondary to pain, significant guarding. Concern for early stage adhesive capsulitis. Pain improved after corticosteroid injection today- will give home stretching exercises and pain medications.    Meds & Orders:  Meds ordered this encounter  Medications  . traMADol (ULTRAM) 50 MG tablet    Sig: Take 1 tablet (50 mg total) by mouth every 6 (six) hours as needed.    Dispense:  30 tablet    Refill:  0   No orders of the defined types were placed in this encounter.   Follow-up: No follow-ups on file.   Procedures: Procedure performed: subacromial corticosteroid injection; landmark guided  Consent obtained and verified. Time-out conducted. Noted no overlying erythema, induration, or other signs of local infection. Theright posterior subacromial space was palpated and marked. The overlying  skinwas prepped in a sterile fashion. Topical analgesic spray: Ethyl chloride. Joint: left subacromial Needle: 25 gauge Completed without difficulty. Meds: 3 cc xylocaine, 1 cc methylpred  Clinical History: No specialty comments available.   She reports that she has never smoked. She has never used smokeless tobacco. No results for input(s): HGBA1C, LABURIC in the last 8760 hours.  Objective:  VS:  HT:    WT:   BMI:     BP:   HR: bpm  TEMP: ( )  RESP:  Physical Exam  PHYSICAL EXAM: Gen: NAD, alert, cooperative with exam, well-appearing HEENT: clear conjunctiva,  CV:  no edema, capillary refill brisk, normal rate Resp: non-labored Skin: no rashes, normal turgor  Neuro: no gross deficits.  Psych:  alert and oriented  Ortho Exam  Left Shoulder: Inspection reveals asymmetry with left shoulder lower than right. No bruising. No swelling TTP over [facility] joint, bicipital groove. Palpation of AC joint reproduces shooting pain down arm Limited ROM in flexion (~100 degrees), abduction (~90 degrees), internal/external rotation (~45 degrees). Passive external rotation limited. NV intact distally Normal scapular function observed. Special Tests:  - Impingement: Positive Hawkins, neers, empty can sign. - Supraspinatous: Positive empty can.  5/5 strength with resisted flexion at 20 degrees (relatively weaker than right 2/2 pain) - Infraspinatous/Teres Minor: 4/5 strength with ER - Subscapularis: positive belly press, 5/5 strength with IR - AC Joint: Negative cross arm - Painful arc   Imaging: No results found.  Past Medical/Family/Surgical/Social History: Medications & Allergies reviewed  per EMR, new medications updated. Patient Active Problem List   Diagnosis Date Noted  . NSTEMI (non-ST elevated myocardial infarction) ([place]) 07/31/2017  . Acute non Q wave myocardial infarction ([place]) 07/20/2017  . Ambulatory dysfunction   . Intractable pain   . Uncontrolled type 2 diabetes  mellitus without complication, with long-term current use of insulin ([place])   . Sciatica of right side   . Hyperglycemia 10/09/2016  . Lumbar herniated disc 10/09/2016  . Myelopathy ([place]) 10/09/2016  . Hypercholesterolemia 01/23/2016  . Vitamin D deficiency 01/23/2016  . Vaginal candidiasis 11/29/2015  . Spinal stenosis of lumbar region 11/22/2015  . Obesity, Class II, BMI 35-39.9, with comorbidity 10/15/2015  . Lumbosacral radiculopathy due to osteoarthritis of spine 06/10/2015  . Uncontrolled type 2 diabetes mellitus with hyperglycemia, with long-term current use of insulin ([place]) 06/10/2015  . Fibroids, intramural 06/27/2014  . Chest pain 01/06/2012  . Dyspnea 01/06/2012  . Controlled type 2 diabetes mellitus with hyperglycemia ([place]) 09/25/2007  . Essential hypertension 09/25/2007   Past Medical History:  Diagnosis Date  . Abnormal Pap smear   . CAD (coronary artery disease)   . Diabetes mellitus    insulin-dependent type 2  . Family history of adverse reaction to anesthesia    sister has n/v  . Fibroid   . HNP (herniated nucleus pulposus)   . Hypertension    chronic hypertension   Family History  Problem Relation Age of Onset  . Heart disease Mother   . Diabetes Mother   . Hypertension Father   . Diabetes Father   . Kidney failure Father   . Stroke Father   . Hypertension Paternal Grandmother   . Diabetes Paternal Grandmother   . Stroke Paternal Grandmother   . Diabetes Maternal Grandmother   . Ovarian cancer Maternal Grandmother   . Diabetes Paternal Grandfather    Past Surgical History:  Procedure Laterality Date  . CARDIAC CATHETERIZATION  02/29/2008   Normal coronary arteries -- Normal LV (left ventricular) systolic function -- Mild to moderate elevation in left ventricular end-diastolic  pressure secondary to hypertension, diabetes and obesity  . CHOLECYSTECTOMY  1995  . COLPOSCOPY    . CORONARY STENT INTERVENTION N/A 07/21/2017   Procedure: Coronary Stent  Intervention;  Surgeon: [name], MD;  Location: [facility];  Service: Cardiovascular;  Laterality: N/A;  . DECOMPRESSIVE LUMBAR LAMINECTOMY LEVEL 1 Right 11/22/2015   Procedure: DECOMPRESSIVE LUMBAR LAMINECTOMY L5-S1 ON RIGHT,FORAMINOTOMIES L5,S1 DISCECTOMY L5,S1;  Surgeon: Susa Day, MD;  Location: [facility];  Service: Orthopedics;  Laterality: Right;  . GYNECOLOGIC CRYOSURGERY  1998  . Hysteroscopy, D&C, Novasure ablation, removal of Intrauterine device  01/08/2008  . LEFT HEART CATH AND CORONARY ANGIOGRAPHY N/A 07/21/2017   Procedure: Left Heart Cath and Coronary Angiography;  Surgeon: [name], MD;  Location: [facility];  Service: Cardiovascular;  Laterality: N/A;  . LUMBAR LAMINECTOMY/DECOMPRESSION MICRODISCECTOMY Right 10/11/2016   Procedure: REVISION MICRO LUMBAR DECOMPRESSION L5-S1 RIGHT;  Surgeon: Susa Day, MD;  Location: [facility];  Service: Orthopedics;  Laterality: Right;  . [place]  01/08/2008   Social History   Occupational History  . Occupation: [employer], consierge  Tobacco Use  . Smoking status: Never Smoker  . Smokeless tobacco: Never Used  Substance and Sexual Activity  . Alcohol use: No  . Drug use: No  . Sexual activity: Yes    Birth control/protection: Surgical    Comment: ablation

## 2019-07-19 NOTE — Progress Notes (Signed)
I saw and examined the patient with Dr. Mayer Masker and agree with assessment and plan as outlined.  She has had left shoulder pain for more than a month with no injury.  Pain with all movements.  She is diffusely tender around the shoulder.  She starting to get early adhesive capsulitis.  Rotator cuff strength seems to be intact.  We will inject the subacromial space today and start her on frozen shoulder exercises.  Could do formal PT if she fails to make progress.  She will watch her blood sugars closely for a couple days.  Follow-up as needed.

## 2019-07-28 DIAGNOSIS — E119 Type 2 diabetes mellitus without complications: Secondary | ICD-10-CM | POA: Diagnosis not present

## 2019-07-28 DIAGNOSIS — N898 Other specified noninflammatory disorders of vagina: Secondary | ICD-10-CM | POA: Diagnosis not present

## 2019-07-28 DIAGNOSIS — N760 Acute vaginitis: Secondary | ICD-10-CM | POA: Diagnosis not present

## 2019-07-28 DIAGNOSIS — B9689 Other specified bacterial agents as the cause of diseases classified elsewhere: Secondary | ICD-10-CM | POA: Diagnosis not present

## 2019-08-17 ENCOUNTER — Encounter: Payer: Self-pay | Admitting: Family Medicine

## 2019-08-17 ENCOUNTER — Ambulatory Visit (INDEPENDENT_AMBULATORY_CARE_PROVIDER_SITE_OTHER): Payer: BC Managed Care – PPO | Admitting: Family Medicine

## 2019-08-17 DIAGNOSIS — M7502 Adhesive capsulitis of left shoulder: Secondary | ICD-10-CM | POA: Diagnosis not present

## 2019-08-17 MED ORDER — HYDROCODONE-ACETAMINOPHEN 5-325 MG PO TABS: 1.0000 | ORAL_TABLET | Freq: Every evening | ORAL | 0 refills | Status: DC | PRN

## 2019-08-17 MED ORDER — DICLOFENAC SODIUM 75 MG PO TBEC: 75.0000 mg | DELAYED_RELEASE_TABLET | Freq: Two times a day (BID) | ORAL | 3 refills | Status: DC | PRN

## 2019-08-17 NOTE — Progress Notes (Signed)
I saw and examined the patient with Dr. Mayer Masker and agree with assessment and plan as outlined.  Two weeks of good relief with subacromial injection, but now in severe pain with very limited ROM.  Doubt RCT based on good relief with injection.  Will start PT at [facility].  Try diclofenac with hydrocodone only for severe pain.  Consider [place] injection if pain persists.

## 2019-08-17 NOTE — Progress Notes (Signed)
Erica Nguyen - 48 y.o. female MRN [id number]  Date of birth: 1971/11/23  Office Visit Note: Visit Date: 08/17/2019 PCP: Elisabeth Cara, PA-C Referred by: Erica Nguyen, Erica Nguyen, *  Subjective: Chief Complaint  Patient presents with  . Left Shoulder - Pain    Pain eased up x 2 weeks, but then returned. Decreased ROM, due to pain. Disturbing her sleep. Tramadol and Tylenol - not helping.   HPI: Erica Nguyen is a 48 y.o. female who comes in today for follow up of left shoulder pain.   She was last seen on 7/27, diagnosed with adhesive capsulitis. She received a subacromial corticosteroid injection with home exercises. Reports that she had significant pain relief for two weeks and was able to move shoulder in all directions with no issues. Gradually over the past two weeks, pain has significantly worsened. Over the past weekend, she has been unable to sleep due to pain. ROM is significantly reduced in the arm. Has been taking tramadol and tylenol with no relief. Pain is over general shoulder, with shooting pains down left arm. No numbness. No acute event or injury.  Has insulin dependent T1DM. Recent Hgb A1C ~ 8.  ROS Otherwise per HPI.  Assessment & Plan: Visit Diagnoses:  1. Adhesive capsulitis of left shoulder   48 yo female with T2DM, adhesive capsulitis of shoulder. She was pain free for two weeks after subacromial injection but pain has gradually worsened and ROM is significantly more limited today than at prior visit. Will refer to PT as she is now not doing home exercises due to pain and will trial diclofenac for general pain and Vicodin for significant pain at night that interferes with sleep. If pain persists, will consider glenohumeral injection with possible MRI.   Meds & Orders:  Meds ordered this encounter  Medications  . diclofenac (VOLTAREN) 75 MG EC tablet    Sig: Take 1 tablet (75 mg total) by mouth 2 (two) times daily as needed.    Dispense:  60 tablet   Refill:  3  . HYDROcodone-acetaminophen (NORCO/VICODIN) 5-325 MG tablet    Sig: Take 1 tablet by mouth at bedtime as needed for moderate pain.    Dispense:  10 tablet    Refill:  0   No orders of the defined types were placed in this encounter.   Follow-up: No follow-ups on file.   Procedures: none  Clinical History: No specialty comments available.   She reports that she has never smoked. She has never used smokeless tobacco. No results for input(s): HGBA1C, LABURIC in the last 8760 hours.  Objective:  VS:  HT:    WT:   BMI:     BP:   HR: bpm  TEMP: ( )  RESP:  Physical Exam  PHYSICAL EXAM: Gen: NAD, alert, cooperative with exam, well-appearing HEENT: clear conjunctiva,  CV:  no edema, capillary refill brisk, normal rate Resp: non-labored Skin: no rashes, normal turgor  Neuro: no gross deficits.  Psych:  alert and oriented  Ortho Exam  Left Shoulder: Inspection reveals asymmetry with left shoulder lower than right. No bruising. No swelling TTP over [facility] joint, bicipital groove. Limited ROM in flexion (~80 degrees), abduction (~70 degrees), very limited internal and external rotation. Passive external rotation limited to 20 degrees.  Strength testing limited due to pain.  NV intact distally Normal scapular function observed. Special Tests:  - Impingement: Positive Hawkins - Supraspinatous: 4/5 strength with resisted flexion at 20 degrees (relatively weaker than  right 2/2 pain) - Infraspinatous/Teres Minor: 4/5 strength with ER - Subscapularis: positive belly press, 4/5 strength with IR  Imaging: No results found.  Past Medical/Family/Surgical/Social History: Medications & Allergies reviewed per EMR, new medications updated. Patient Active Problem List   Diagnosis Date Noted  . NSTEMI (non-ST elevated myocardial infarction) ([place]) 07/31/2017  . Acute non Q wave myocardial infarction ([place]) 07/20/2017  . Ambulatory dysfunction   . Intractable pain   . Uncontrolled  type 2 diabetes mellitus without complication, with long-term current use of insulin ([place])   . Sciatica of right side   . Hyperglycemia 10/09/2016  . Lumbar herniated disc 10/09/2016  . Myelopathy ([place]) 10/09/2016  . Hypercholesterolemia 01/23/2016  . Vitamin D deficiency 01/23/2016  . Vaginal candidiasis 11/29/2015  . Spinal stenosis of lumbar region 11/22/2015  . Obesity, Class II, BMI 35-39.9, with comorbidity 10/15/2015  . Lumbosacral radiculopathy due to osteoarthritis of spine 06/10/2015  . Uncontrolled type 2 diabetes mellitus with hyperglycemia, with long-term current use of insulin ([place]) 06/10/2015  . Fibroids, intramural 06/27/2014  . Chest pain 01/06/2012  . Dyspnea 01/06/2012  . Controlled type 2 diabetes mellitus with hyperglycemia ([place]) 09/25/2007  . Essential hypertension 09/25/2007   Past Medical History:  Diagnosis Date  . Abnormal Pap smear   . CAD (coronary artery disease)   . Diabetes mellitus    insulin-dependent type 2  . Family history of adverse reaction to anesthesia    sister has n/v  . Fibroid   . HNP (herniated nucleus pulposus)   . Hypertension    chronic hypertension   Family History  Problem Relation Age of Onset  . Heart disease Mother   . Diabetes Mother   . Hypertension Father   . Diabetes Father   . Kidney failure Father   . Stroke Father   . Hypertension Paternal Grandmother   . Diabetes Paternal Grandmother   . Stroke Paternal Grandmother   . Diabetes Maternal Grandmother   . Ovarian cancer Maternal Grandmother   . Diabetes Paternal Grandfather    Past Surgical History:  Procedure Laterality Date  . CARDIAC CATHETERIZATION  02/29/2008   Normal coronary arteries -- Normal LV (left ventricular) systolic function -- Mild to moderate elevation in left ventricular end-diastolic  pressure secondary to hypertension, diabetes and obesity  . CHOLECYSTECTOMY  1995  . COLPOSCOPY    . CORONARY STENT INTERVENTION N/A 07/21/2017   Procedure:  Coronary Stent Intervention;  Surgeon: [name], MD;  Location: [facility];  Service: Cardiovascular;  Laterality: N/A;  . DECOMPRESSIVE LUMBAR LAMINECTOMY LEVEL 1 Right 11/22/2015   Procedure: DECOMPRESSIVE LUMBAR LAMINECTOMY L5-S1 ON RIGHT,FORAMINOTOMIES L5,S1 DISCECTOMY L5,S1;  Surgeon: Susa Day, MD;  Location: [facility];  Service: Orthopedics;  Laterality: Right;  . GYNECOLOGIC CRYOSURGERY  1998  . Hysteroscopy, D&C, Novasure ablation, removal of Intrauterine device  01/08/2008  . LEFT HEART CATH AND CORONARY ANGIOGRAPHY N/A 07/21/2017   Procedure: Left Heart Cath and Coronary Angiography;  Surgeon: [name], MD;  Location: [facility];  Service: Cardiovascular;  Laterality: N/A;  . LUMBAR LAMINECTOMY/DECOMPRESSION MICRODISCECTOMY Right 10/11/2016   Procedure: REVISION MICRO LUMBAR DECOMPRESSION L5-S1 RIGHT;  Surgeon: Susa Day, MD;  Location: [facility];  Service: Orthopedics;  Laterality: Right;  . [place]  01/08/2008   Social History   Occupational History  . Occupation: [employer], consierge  Tobacco Use  . Smoking status: Never Smoker  . Smokeless tobacco: Never Used  Substance and Sexual Activity  . Alcohol use: No  .  Drug use: No  . Sexual activity: Yes    Birth control/protection: Surgical    Comment: ablation

## 2019-08-19 DIAGNOSIS — N3001 Acute cystitis with hematuria: Secondary | ICD-10-CM | POA: Diagnosis not present

## 2019-08-19 DIAGNOSIS — R8290 Unspecified abnormal findings in urine: Secondary | ICD-10-CM | POA: Diagnosis not present

## 2019-08-19 DIAGNOSIS — Z79899 Other long term (current) drug therapy: Secondary | ICD-10-CM | POA: Diagnosis not present

## 2019-08-20 ENCOUNTER — Telehealth: Payer: Self-pay | Admitting: Family Medicine

## 2019-08-20 NOTE — Telephone Encounter (Signed)
Patient called advised the pharmacy will not fill the Rx (Hydrocodone) without prior auth. The number to contact patient is [phone]

## 2019-08-20 NOTE — Telephone Encounter (Signed)
I called Rob at [facility]. This is the first Rx for the norco, so she can only receive a 7-day supply (they can only fill #7, to take qhs prn, & she loses the other 3 pills). I advised the patient of this. She can now pick up the Rx for 7 pills. I asked her to let us know if she feels like she needs more of this, but hopefully she will only need the 7 days' supply. The patient voiced understanding.

## 2019-08-24 ENCOUNTER — Ambulatory Visit: Payer: BC Managed Care – PPO | Admitting: Physician Assistant

## 2019-08-24 ENCOUNTER — Encounter: Payer: Self-pay | Admitting: Family Medicine

## 2019-09-07 DIAGNOSIS — H1045 Other chronic allergic conjunctivitis: Secondary | ICD-10-CM | POA: Diagnosis not present

## 2019-09-22 ENCOUNTER — Ambulatory Visit (INDEPENDENT_AMBULATORY_CARE_PROVIDER_SITE_OTHER): Payer: BC Managed Care – PPO | Admitting: Physician Assistant

## 2019-09-22 ENCOUNTER — Other Ambulatory Visit: Payer: Self-pay

## 2019-09-22 ENCOUNTER — Encounter: Payer: Self-pay | Admitting: Physician Assistant

## 2019-09-22 VITALS — BP 157/93 | HR 102 | Temp 97.0°F | Ht 66.0 in | Wt 250.2 lb

## 2019-09-22 DIAGNOSIS — I2510 Atherosclerotic heart disease of native coronary artery without angina pectoris: Secondary | ICD-10-CM | POA: Diagnosis not present

## 2019-09-22 DIAGNOSIS — E119 Type 2 diabetes mellitus without complications: Secondary | ICD-10-CM | POA: Diagnosis not present

## 2019-09-22 DIAGNOSIS — E785 Hyperlipidemia, unspecified: Secondary | ICD-10-CM | POA: Diagnosis not present

## 2019-09-22 DIAGNOSIS — Z794 Long term (current) use of insulin: Secondary | ICD-10-CM

## 2019-09-22 DIAGNOSIS — I10 Essential (primary) hypertension: Secondary | ICD-10-CM | POA: Diagnosis not present

## 2019-09-22 MED ORDER — LISINOPRIL 40 MG PO TABS: 40.0000 mg | ORAL_TABLET | Freq: Every day | ORAL | 2 refills | Status: DC

## 2019-09-22 MED ORDER — METOPROLOL SUCCINATE ER 50 MG PO TB24: 50.0000 mg | ORAL_TABLET | Freq: Two times a day (BID) | ORAL | 1 refills | Status: DC

## 2019-09-22 NOTE — Progress Notes (Addendum)
Cardiology Office Note    Date:  09/23/2019   ID:  Erica Nguyen, DOB 1971-01-01, MRN [id number]  PCP:  Elisabeth Cara, PA-C  Cardiologist:  Dr. Stanford Breed  Chief Complaint  Patient presents with  . Follow-up    seen for Dr. Stanford Breed.    History of Present Illness:  Erica Nguyen is a 48 y.o. female with PMH of HTN, HLD, DM and ICM with improved EF.  Patient was admitted in July 2018 with NSTEMI.  She was cared for by Dr. Terrence Dupont at the time.  Cardiac catheterization showed EF 35 to 45%, 75% stenosis in the RCA, 75% stenosis in distal LAD.  She had DES placed in both LAD and RCA.  She has since followed with [facility].  Repeat echocardiogram obtained in December 2018 showed EF has improved to 50 to 55%, grade 2 DD, PA peak pressure 35 mmHg.  I last saw the patient in January 2020, at which time he was complaining of some dizziness and also left-sided chest pain.  We repeated a Myoview on 01/08/2019 which showed EF 54%, medium defect of mild severity present in the apical anterior, apical septal and apex location consistent with breast attenuation artifact, overall low risk study.  Patient presents today for cardiology office visit.  She is still working for [employer] and [employer] and has significant stress at her work.  Otherwise she denies any obvious chest discomfort or shortness of breath.  She has no lower extremity edema, orthopnea or PND.  Blood pressure is elevated, I increased metoprolol to 50 mg twice daily   Past Medical History:  Diagnosis Date  . Abnormal Pap smear   . CAD (coronary artery disease)   . Diabetes mellitus    insulin-dependent type 2  . Family history of adverse reaction to anesthesia    sister has n/v  . Fibroid   . HNP (herniated nucleus pulposus)   . Hypertension    chronic hypertension    Past Surgical History:  Procedure Laterality Date  . CARDIAC CATHETERIZATION  02/29/2008   Normal coronary arteries -- Normal LV (left ventricular)  systolic function -- Mild to moderate elevation in left ventricular end-diastolic  pressure secondary to hypertension, diabetes and obesity  . CHOLECYSTECTOMY  1995  . COLPOSCOPY    . CORONARY STENT INTERVENTION N/A 07/21/2017   Procedure: Coronary Stent Intervention;  Surgeon: [name], MD;  Location: [place] [facility];  Service: Cardiovascular;  Laterality: N/A;  . DECOMPRESSIVE LUMBAR LAMINECTOMY LEVEL 1 Right 11/22/2015   Procedure: DECOMPRESSIVE LUMBAR LAMINECTOMY L5-S1 ON RIGHT,FORAMINOTOMIES L5,S1 DISCECTOMY L5,S1;  Surgeon: Susa Day, MD;  Location: [facility];  Service: Orthopedics;  Laterality: Right;  . GYNECOLOGIC CRYOSURGERY  1998  . Hysteroscopy, D&C, Novasure ablation, removal of Intrauterine device  01/08/2008  . LEFT HEART CATH AND CORONARY ANGIOGRAPHY N/A 07/21/2017   Procedure: Left Heart Cath and Coronary Angiography;  Surgeon: [name], MD;  Location: [place] [facility];  Service: Cardiovascular;  Laterality: N/A;  . LUMBAR LAMINECTOMY/DECOMPRESSION MICRODISCECTOMY Right 10/11/2016   Procedure: REVISION MICRO LUMBAR DECOMPRESSION L5-S1 RIGHT;  Surgeon: Susa Day, MD;  Location: [facility];  Service: Orthopedics;  Laterality: Right;  . NOVASURE ABLATION  01/08/2008    Current Medications: Outpatient Medications Prior to Visit  Medication Sig Dispense Refill  . acetaminophen (TYLENOL) 500 MG tablet Take 1,000 mg by mouth every 6 (six) hours as needed for moderate pain or headache.    Marland Kitchen aspirin EC 81 MG EC  tablet Take 1 tablet (81 mg total) by mouth daily. 30 tablet 3  . diclofenac (VOLTAREN) 75 MG EC tablet Take 1 tablet (75 mg total) by mouth 2 (two) times daily as needed. 60 tablet 3  . fluconazole (DIFLUCAN) 150 MG tablet Take 1 tablet (150 mg total) by mouth daily. Take second dose 72 hours later if symptoms still persists. 2 tablet 0  . glucose blood (PRECISION QID TEST) test strip For blood sugar monitoring 3x daily with the One Touch Verio Flex glucometer     . HYDROcodone-acetaminophen (NORCO/VICODIN) 5-325 MG tablet Take 1 tablet by mouth at bedtime as needed for moderate pain. 10 tablet 0  . insulin lispro (HUMALOG) 100 UNIT/ML injection Inject 20 Units into the skin daily before supper.     Marland Kitchen LANTUS SOLOSTAR 100 UNIT/ML Solostar Pen INJECT 50 UNITS DOSE INTO THE SKIN DAILY.    Marland Kitchen Meclizine HCl 25 MG CHEW Chew 1 tablet (25 mg total) by mouth daily. 60 each 0  . nitroGLYCERIN (NITROSTAT) 0.4 MG SL tablet Place 1 tablet (0.4 mg total) under the tongue every 5 (five) minutes x 3 doses as needed for chest pain. 25 tablet 12  . nystatin cream (MYCOSTATIN) Apply to affected area 2 times daily 30 g 0  . rosuvastatin (CRESTOR) 40 MG tablet Take 1 tablet (40 mg total) by mouth daily. 90 tablet 3  . lisinopril (PRINIVIL,ZESTRIL) 40 MG tablet Take 40 mg by mouth daily.    . metoprolol succinate (TOPROL-XL) 50 MG 24 hr tablet Take 1 tablet (50 mg total) by mouth daily.    . traMADol (ULTRAM) 50 MG tablet Take 1 tablet (50 mg total) by mouth every 6 (six) hours as needed. 30 tablet 0   No facility-administered medications prior to visit.      Allergies:   Amoxicillin   Social History   Socioeconomic History  . Marital status: Single    Spouse name: Not on file  . Number of children: 1  . Years of education: Not on file  . Highest education level: Not on file  Occupational History  . Occupation: Airline pilot, Medical illustrator  Social Needs  . Financial resource strain: Not on file  . Food insecurity    Worry: Not on file    Inability: Not on file  . Transportation needs    Medical: Not on file    Non-medical: Not on file  Tobacco Use  . Smoking status: Never Smoker  . Smokeless tobacco: Never Used  Substance and Sexual Activity  . Alcohol use: No  . Drug use: No  . Sexual activity: Yes    Birth control/protection: Surgical    Comment: ablation  Lifestyle  . Physical activity    Days per week: Not on file    Minutes per session: Not on file  .  Stress: Not on file  Relationships  . Social Herbalist on phone: Not on file    Gets together: Not on file    Attends religious service: Not on file    Active member of club or organization: Not on file    Attends meetings of clubs or organizations: Not on file    Relationship status: Not on file  Other Topics Concern  . Not on file  Social History Narrative  . Not on file     Family History:  The patient's family history includes Diabetes in her father, maternal grandmother, mother, paternal grandfather, and paternal grandmother; Heart disease in her mother;  Hypertension in her father and paternal grandmother; Kidney failure in her father; Ovarian cancer in her maternal grandmother; Stroke in her father and paternal grandmother.   ROS:   Please see the history of present illness.    ROS All other systems reviewed and are negative.   PHYSICAL EXAM:   VS:  BP (!) 157/93   Pulse (!) 102   Temp (!) 97 F (36.1 C)   Ht 5\' 6"  (1.676 m)   Wt 250 lb 3.2 oz (113.5 kg)   SpO2 98%   BMI 40.38 kg/m    GEN: Well nourished, well developed, in no acute distress  HEENT: normal  Neck: no JVD, carotid bruits, or masses Cardiac: RRR; no murmurs, rubs, or gallops,no edema  Respiratory:  clear to auscultation bilaterally, normal work of breathing GI: soft, nontender, nondistended, + BS MS: no deformity or atrophy  Skin: warm and dry, no rash Neuro:  Alert and Oriented x 3, Strength and sensation are intact Psych: euthymic mood, full affect  Wt Readings from Last 3 Encounters:  09/22/19 250 lb 3.2 oz (113.5 kg)  01/07/19 260 lb (117.9 kg)  01/01/19 260 lb 6.4 oz (118.1 kg)      Studies/Labs Reviewed:   EKG:  EKG is ordered today.  The ekg ordered today demonstrates sinus tachycardia, heart rate of 102, no significant ST-T wave changes.  Recent Labs: No results found for requested labs within last 8760 hours.   Lipid Panel No results found for: CHOL, TRIG, HDL, CHOLHDL,  VLDL, LDLCALC, LDLDIRECT  Additional studies/ records that were reviewed today include:   Myoview 01/08/2019  The left ventricular ejection fraction is mildly decreased (45-54%).  Nuclear stress EF: 54%.  Defect 1: There is a medium defect of mild severity present in the apical anterior, apical septal and apex location. This appears to be artifact due to breast attenuation .  This is a low risk study.   ASSESSMENT:    1. Coronary artery disease involving native coronary artery of native heart without angina pectoris   2. Hyperlipidemia, unspecified hyperlipidemia type   3. Essential hypertension   4. Controlled type 2 diabetes mellitus without complication, with long-term current use of insulin (HCC)      PLAN:  In order of problems listed above:  1. CAD: Recent Myoview obtained in January 2020 did not show any significant ischemia.  Continue aspirin and Crestor  2. Hypertension: Blood pressure elevated, increase metoprolol to 50 mg twice daily  3. Hyperlipidemia: Continue on Crestor.  Due for fasting lipid panel and LFT.  4. DM2: On insulin, managed by primary care provider.    Medication Adjustments/Labs and Tests Ordered: Current medicines are reviewed at length with the patient today.  Concerns regarding medicines are outlined above.  Medication changes, Labs and Tests ordered today are listed in the Patient Instructions below. Patient Instructions  Medication Instructions:   INCREASE Metoprolol TO 50 MG 2 TIMES A DAY If you need a refill on your cardiac medications before your next appointment, please call your pharmacy.   Lab work: You will need to return in 2 months to have labs (blood work) drawn:  Fasting Lipid Panel-DO NOT EAT OR DRINK PAST MIDNIGHT  Liver Function Test  If you have labs (blood work) drawn today and your tests are completely normal, you will receive your results only by: Marland Kitchen MyChart Message (if you have MyChart) OR . A paper copy in the  mail If you have any lab test that is  abnormal or we need to change your treatment, we will call you to review the results.  Testing/Procedures: NONE ordered at this time of appointment   Follow-Up: At [facility], you and your health needs are our priority.  As part of our continuing mission to provide you with exceptional heart care, we have created designated Provider Care Teams.  These Care Teams include your primary Cardiologist (physician) and Advanced Practice Providers (APPs -  Physician Assistants and Nurse Practitioners) who all work together to provide you with the care you need, when you need it. . You will need a follow up appointment in 6 months with Kirk Ruths, MD   Any Other Special Instructions Will Be Listed Below (If Applicable).       Hilbert Corrigan, [place]  09/23/2019 11:54 PM    [facility] [address], [place], [place]  [address] Phone: [phone]; Fax: [phone]

## 2019-09-22 NOTE — Patient Instructions (Addendum)
Medication Instructions:   INCREASE Metoprolol TO 50 MG 2 TIMES A DAY If you need a refill on your cardiac medications before your next appointment, please call your pharmacy.   Lab work: You will need to return in 2 months to have labs (blood work) drawn:  Fasting Lipid Panel-DO NOT EAT OR DRINK PAST MIDNIGHT  Liver Function Test  If you have labs (blood work) drawn today and your tests are completely normal, you will receive your results only by: Marland Kitchen MyChart Message (if you have MyChart) OR . A paper copy in the mail If you have any lab test that is abnormal or we need to change your treatment, we will call you to review the results.  Testing/Procedures: NONE ordered at this time of appointment   Follow-Up: At [facility], you and your health needs are our priority.  As part of our continuing mission to provide you with exceptional heart care, we have created designated Provider Care Teams.  These Care Teams include your primary Cardiologist (physician) and Advanced Practice Providers (APPs -  Physician Assistants and Nurse Practitioners) who all work together to provide you with the care you need, when you need it. . You will need a follow up appointment in 6 months with Erica Ruths, MD   Any Other Special Instructions Will Be Listed Below (If Applicable).

## 2019-09-23 ENCOUNTER — Encounter: Payer: Self-pay | Admitting: Physician Assistant

## 2019-09-30 ENCOUNTER — Telehealth: Payer: Self-pay | Admitting: Family Medicine

## 2019-09-30 NOTE — Telephone Encounter (Signed)
Patient called. Has new forms, she will email to me tomorrow. She also has authorization to complete and sign.

## 2019-10-07 ENCOUNTER — Ambulatory Visit: Payer: BC Managed Care – PPO | Admitting: Obstetrics

## 2019-10-11 ENCOUNTER — Ambulatory Visit: Payer: Self-pay

## 2019-10-11 ENCOUNTER — Other Ambulatory Visit: Payer: Self-pay

## 2019-10-11 ENCOUNTER — Encounter: Payer: Self-pay | Admitting: Family Medicine

## 2019-10-11 ENCOUNTER — Ambulatory Visit (INDEPENDENT_AMBULATORY_CARE_PROVIDER_SITE_OTHER): Payer: BC Managed Care – PPO | Admitting: Family Medicine

## 2019-10-11 DIAGNOSIS — G8929 Other chronic pain: Secondary | ICD-10-CM

## 2019-10-11 DIAGNOSIS — M25512 Pain in left shoulder: Secondary | ICD-10-CM

## 2019-10-11 NOTE — Progress Notes (Addendum)
Erica Nguyen - 48 y.o. female MRN [id number]  Date of birth: [birth year]  Office Visit Note: Visit Date: 10/11/2019 PCP: Elisabeth Cara, PA-C Referred by: Belva Bertin, [place], *  Subjective: Chief Complaint  Patient presents with  . Left Shoulder - Pain, Follow-up    Pain is worsening. Difficulty sleeping at night. Cortisone injection on 08/17/19 lasted 1 week. OTC lidocaine patch does not help. Out of hydrocodone. Not been able to do PT due to pain.   HPI: Erica Nguyen is a 48 y.o. female who comes in today for follow up of left shoulder pain. She has been diagnosed with frozen shoulder that did not improve with subacromial injection on 8/25. She has not gone to PT because it has hurt too much. Painful with all activities and she is now having weakness in her arm as well. She has tried lidocaine patches with no relief and she is currently out of hydrocodone.   ROS Otherwise per HPI.  Assessment & Plan: Visit Diagnoses:  1. Chronic left shoulder pain    70 yp with T2DM presenting with chronic shoulder pain. Likely adhesive capsulitis, but concerning that she now has weakness in C5-C8. This could be secondary to limited use due to pain, but if this persists after injection, will obtain MRI of cervical spine.   Plan:  - elected for glenohumeral injection today - agreed to try PT - if persistent weakness in 1-2 weeks, will call back for MRI of cervical spine  Meds & Orders: No orders of the defined types were placed in this encounter.   Orders Placed This Encounter  Procedures  . US Guided Needle Placement    Follow-up: PRN  Procedures: No procedures performed   Procedure performed: Glenohumeral corticosteroid injection, ultrasound-guided  Consent obtained and verified. Time-out conducted. Noted no overlying erythema, induration, or other signs of local infection. The  left glenohumeral joint was visualized by ultrasound posteriorly.  The overlying skin was  prepped in a sterile fashion. Topical analgesic spray: Ethyl chloride. Joint: left glenohumeral Needle: 22-gauge 3.5 inch Completed without difficulty. Meds: 6 cc lidocaine, 40 mg methylpred  Clinical History: No specialty comments available.   She reports that she has never smoked. She has never used smokeless tobacco. No results for input(s): HGBA1C, LABURIC in the last 8760 hours.  Objective:  VS:  HT:    WT:   BMI:     BP:   HR: bpm  TEMP: ( )  RESP:  Physical Exam   PHYSICAL EXAM: Gen: NAD, alert, cooperative with exam, well-appearing HEENT: clear conjunctiva,  CV:  no edema, capillary refill brisk, normal rate Resp: non-labored Skin: no rashes, normal turgor  Neuro: no gross deficits.  Psych:  alert and oriented  Left Shoulder: Inspection reveals asymmetry with left shoulder lower than right. No bruising. No swelling TTP over [facility] [place] joint, bicipital groove. Limited ROM in flexion (~60 degrees), abduction (~50 degrees), very limited internal and external rotation. Passive external rotation limited to 20 degrees.  4/5 wrist flexion, extension, biceps, triceps, internal and external rotation NV intact distally  Special Tests:  - Impingement: Positive Hawkins - Supraspinatous: 4/5 strength with resisted flexion at 20 degrees (relatively weaker than right 2/2 pain) - Infraspinatous/Teres Minor: 4/5 strength with ER - Subscapularis: positive belly press, 4/5 strength with IR  Right shoulder No TTP Full ROM Good strength NVI  Neck/Back: - Inspection: no gross deformity or asymmetry, swelling or ecchymosis - Palpation: no TTP  - ROM: full active  ROM of the cervical spine with neck extension, rotation, flexion  - Strength: left weaker than right for wrist flexion, extension, biceps flexion.  triceps extension.  - Neuro: sensation intact in the C5-C8 nerve root distribution b/l, 2+ C5-C7 reflexes - Special testing: negative spurling's  Imaging: US Guided Needle  Placement  Result Date: 10/11/2019 Please see Notes tab for imaging impression. ULTRASOUND: Shoulder, left Diagnostic limited ultrasound imaging obtained of patient's left shoulder.  - No obvious evidence of bony deformity or osteophyte development appreciated.  - Long head of the biceps tendon: No evidence of tendon thickening, calcification, subluxation, or tearing in short or long axis views. No edema or bullseye sign.  - Subscapularis tendon: complete visualization across the width of the insertion point yielded no evidence of tendon thickening, calcification, or tears in the long axis view.  - Supraspinatus tendon: complete visualization across the width of the insertion point yielded no evidence of tendon thickening, calcification, or tears in the long axis view. Np evidence of bursal inflammation appreciated.  - Infraspinatus and teres minor tendons: visualization across the width of the insertion points yielded no evidence of tendon thickening, calcification, or tears in the long axis view.    Past Medical/Family/Surgical/Social History: Medications & Allergies reviewed per EMR, new medications updated. Patient Active Problem List   Diagnosis Date Noted  . NSTEMI (non-ST elevated myocardial infarction) ([place]) 07/31/2017  . Acute non Q wave myocardial infarction ([place]) 07/20/2017  . Ambulatory dysfunction   . Intractable pain   . Uncontrolled type 2 diabetes mellitus without complication, with long-term current use of insulin   . Sciatica of right side   . Hyperglycemia 10/09/2016  . Lumbar herniated disc 10/09/2016  . Myelopathy ([place]) 10/09/2016  . Hypercholesterolemia 01/23/2016  . Vitamin D deficiency 01/23/2016  . Vaginal candidiasis 11/29/2015  . Spinal stenosis of lumbar region 11/22/2015  . Obesity, Class II, BMI 35-39.9, with comorbidity 10/15/2015  . Lumbosacral radiculopathy due to osteoarthritis of spine 06/10/2015  . Uncontrolled type 2 diabetes mellitus with  hyperglycemia, with long-term current use of insulin ([place]) 06/10/2015  . Fibroids, intramural 06/27/2014  . Chest pain 01/06/2012  . Dyspnea 01/06/2012  . Controlled type 2 diabetes mellitus with hyperglycemia ([place]) 09/25/2007  . Essential hypertension 09/25/2007   Past Medical History:  Diagnosis Date  . Abnormal Pap smear   . CAD (coronary artery disease)   . Diabetes mellitus    insulin-dependent type 2  . Family history of adverse reaction to anesthesia    sister has n/v  . Fibroid   . HNP (herniated nucleus pulposus)   . Hypertension    chronic hypertension   Family History  Problem Relation Age of Onset  . Heart disease Mother   . Diabetes Mother   . Hypertension Father   . Diabetes Father   . Kidney failure Father   . Stroke Father   . Hypertension Paternal Grandmother   . Diabetes Paternal Grandmother   . Stroke Paternal Grandmother   . Diabetes Maternal Grandmother   . Ovarian cancer Maternal Grandmother   . Diabetes Paternal Grandfather    Past Surgical History:  Procedure Laterality Date  . CARDIAC CATHETERIZATION  02/29/2008   Normal coronary arteries -- Normal LV (left ventricular) systolic function -- Mild to moderate elevation in left ventricular end-diastolic  pressure secondary to hypertension, diabetes and obesity  . CHOLECYSTECTOMY  1995  . COLPOSCOPY    . CORONARY STENT INTERVENTION N/A 07/21/2017   Procedure: Coronary Stent Intervention;  Surgeon:  [name], MD;  Location: [place] [facility];  Service: Cardiovascular;  Laterality: N/A;  . DECOMPRESSIVE LUMBAR LAMINECTOMY LEVEL 1 Right 11/22/2015   Procedure: DECOMPRESSIVE LUMBAR LAMINECTOMY L5-S1 ON RIGHT,FORAMINOTOMIES L5,S1 DISCECTOMY L5,S1;  Surgeon: Susa Day, MD;  Location[facility];  Service: Orthopedics;  Laterality: Right;  . GYNECOLOGIC CRYOSURGERY  1998  . Hysteroscopy, D&C, Novasure ablation, removal of Intrauterine device  01/08/2008  . LEFT HEART CATH AND CORONARY ANGIOGRAPHY N/A  07/21/2017   Procedure: Left Heart Cath and Coronary Angiography;  Surgeon: [name], MD;  Location: [place] [facility];  Service: Cardiovascular;  Laterality: N/A;  . LUMBAR LAMINECTOMY/DECOMPRESSION MICRODISCECTOMY Right 10/11/2016   Procedure: REVISION MICRO LUMBAR DECOMPRESSION L5-S1 RIGHT;  Surgeon: Susa Day, MD;  Location[facility];  Service: Orthopedics;  Laterality: Right;  . [place]  01/08/2008   Social History   Occupational History  . Occupation: [employer], consierge  Tobacco Use  . Smoking status: Never Smoker  . Smokeless tobacco: Never Used  Substance and Sexual Activity  . Alcohol use: No  . Drug use: No  . Sexual activity: Yes    Birth control/protection: Surgical    Comment: ablation

## 2019-10-11 NOTE — Progress Notes (Signed)
I saw and examined the patient with Dr. Mayer Masker and agree with assessment and plan as outlined.    Persistent left shoulder pain with adhesive capsulitis.  Also weakness in multiple muscle groups.  Will inject [place] joint today.  When she returns from [facility], if still having weakness we will order cervical MRI and if normal, then nerve studies.

## 2019-10-17 ENCOUNTER — Ambulatory Visit
Admission: EM | Admit: 2019-10-17 | Discharge: 2019-10-17 | Disposition: A | Discharge status: Home / Self Care | Payer: BC Managed Care – PPO | Attending: Emergency Medicine | Admitting: Emergency Medicine

## 2019-10-17 ENCOUNTER — Other Ambulatory Visit: Payer: Self-pay

## 2019-10-17 DIAGNOSIS — R059 Cough, unspecified: Secondary | ICD-10-CM

## 2019-10-17 DIAGNOSIS — R05 Cough: Secondary | ICD-10-CM | POA: Diagnosis not present

## 2019-10-17 DIAGNOSIS — Z20828 Contact with and (suspected) exposure to other viral communicable diseases: Secondary | ICD-10-CM | POA: Diagnosis not present

## 2019-10-17 MED ORDER — BENZONATATE 100 MG PO CAPS: 100.0000 mg | ORAL_CAPSULE | Freq: Three times a day (TID) | ORAL | 0 refills | Status: DC

## 2019-10-17 MED ORDER — ALBUTEROL SULFATE HFA 108 (90 BASE) MCG/ACT IN AERS: 2.0000 | INHALATION_SPRAY | RESPIRATORY_TRACT | 0 refills | Status: DC | PRN

## 2019-10-17 NOTE — ED Provider Notes (Signed)
[name]    CSN: [id number] Arrival date & time: 10/17/19  1317      History   Chief Complaint Chief Complaint  Patient presents with  . Cough    HPI Erica Nguyen is a 48 y.o. female with history of CAD, type 2 diabetes with chronic insulin use, hypertension presenting for 2-day course of productive, nonhemoptic cough, loss of smell/taste beginning 4 days ago.  States that those symptoms have resolved, though she is now having nasal drainage, body aches, persistent cough without sputum production or shortness of breath.  States her sister tested positive on Tuesday, that they have been around each other frequently, last close contact Sunday prior to sister being tested.  Patient has tried numerous OTC medications with moderate relief of symptoms.  Able to tolerate p.o. intake/hydration.   Past Medical History:  Diagnosis Date  . Abnormal Pap smear   . CAD (coronary artery disease)   . Diabetes mellitus    insulin-dependent type 2  . Family history of adverse reaction to anesthesia    sister has n/v  . Fibroid   . HNP (herniated nucleus pulposus)   . Hypertension    chronic hypertension    Patient Active Problem List   Diagnosis Date Noted  . NSTEMI (non-ST elevated myocardial infarction) ([place]) 07/31/2017  . Acute non Q wave myocardial infarction ([place]) 07/20/2017  . Ambulatory dysfunction   . Intractable pain   . Uncontrolled type 2 diabetes mellitus without complication, with long-term current use of insulin   . Sciatica of right side   . Hyperglycemia 10/09/2016  . Lumbar herniated disc 10/09/2016  . Myelopathy ([place]) 10/09/2016  . Hypercholesterolemia 01/23/2016  . Vitamin D deficiency 01/23/2016  . Vaginal candidiasis 11/29/2015  . Spinal stenosis of lumbar region 11/22/2015  . Obesity, Class II, BMI 35-39.9, with comorbidity 10/15/2015  . Lumbosacral radiculopathy due to osteoarthritis of spine 06/10/2015  . Uncontrolled type 2 diabetes mellitus  with hyperglycemia, with long-term current use of insulin ([place]) 06/10/2015  . Fibroids, intramural 06/27/2014  . Chest pain 01/06/2012  . Dyspnea 01/06/2012  . Controlled type 2 diabetes mellitus with hyperglycemia ([place]) 09/25/2007  . Essential hypertension 09/25/2007    Past Surgical History:  Procedure Laterality Date  . CARDIAC CATHETERIZATION  02/29/2008   Normal coronary arteries -- Normal LV (left ventricular) systolic function -- Mild to moderate elevation in left ventricular end-diastolic  pressure secondary to hypertension, diabetes and obesity  . CHOLECYSTECTOMY  1995  . COLPOSCOPY    . CORONARY STENT INTERVENTION N/A 07/21/2017   Procedure: Coronary Stent Intervention;  Surgeon: [name], MD;  Location: [facility];  Service: Cardiovascular;  Laterality: N/A;  . DECOMPRESSIVE LUMBAR LAMINECTOMY LEVEL 1 Right 11/22/2015   Procedure: DECOMPRESSIVE LUMBAR LAMINECTOMY L5-S1 ON RIGHT,FORAMINOTOMIES L5,S1 DISCECTOMY L5,S1;  Surgeon: Susa Day, MD;  Location: [facility];  Service: Orthopedics;  Laterality: Right;  . GYNECOLOGIC CRYOSURGERY  1998  . Hysteroscopy, D&C, Novasure ablation, removal of Intrauterine device  01/08/2008  . LEFT HEART CATH AND CORONARY ANGIOGRAPHY N/A 07/21/2017   Procedure: Left Heart Cath and Coronary Angiography;  Surgeon: [name], MD;  Location: [facility];  Service: Cardiovascular;  Laterality: N/A;  . LUMBAR LAMINECTOMY/DECOMPRESSION MICRODISCECTOMY Right 10/11/2016   Procedure: REVISION MICRO LUMBAR DECOMPRESSION L5-S1 RIGHT;  Surgeon: Susa Day, MD;  Location: [facility];  Service: Orthopedics;  Laterality: Right;  . NOVASURE ABLATION  01/08/2008    OB History    Gravida  3  Para  1   Term  1   Preterm      AB  2   Living  1     SAB  2   TAB      Ectopic      Multiple      Live Births  1            Home Medications    Prior to Admission medications   Medication Sig Start Date End Date Taking?  Authorizing Provider  acetaminophen (TYLENOL) 500 MG tablet Take 1,000 mg by mouth every 6 (six) hours as needed for moderate pain or headache.    [provider]  albuterol (VENTOLIN HFA) 108 (90 Base) MCG/ACT inhaler Inhale 2 puffs into the lungs every 4 (four) hours as needed for wheezing or shortness of breath. 10/17/19   Hall-Potvin, [place], PA-C  aspirin EC 81 MG EC tablet Take 1 tablet (81 mg total) by mouth daily. 07/24/17   [name], MD  benzonatate (TESSALON) 100 MG capsule Take 1 capsule (100 mg total) by mouth every 8 (eight) hours. 10/17/19   Hall-Potvin, [place], PA-C  glucose blood (PRECISION QID TEST) test strip For blood sugar monitoring 3x daily with the One Touch Verio Flex glucometer 10/22/18   [provider]  insulin lispro (HUMALOG) 100 UNIT/ML injection Inject 20 Units into the skin daily before supper.     [provider]  LANTUS SOLOSTAR 100 UNIT/ML Solostar Pen INJECT 50 UNITS DOSE INTO THE SKIN DAILY. 03/10/19   [provider]  lisinopril (ZESTRIL) 40 MG tablet Take 1 tablet (40 mg total) by mouth daily. 09/22/19   Almyra Deforest, PA  metoprolol succinate (TOPROL-XL) 50 MG 24 hr tablet Take 1 tablet (50 mg total) by mouth 2 (two) times daily. 09/22/19   Almyra Deforest, PA  nitroGLYCERIN (NITROSTAT) 0.4 MG SL tablet Place 1 tablet (0.4 mg total) under the tongue every 5 (five) minutes x 3 doses as needed for chest pain. 07/23/17   [name], MD  rosuvastatin (CRESTOR) 40 MG tablet Take 1 tablet (40 mg total) by mouth daily. 01/01/19 09/22/19  Almyra Deforest, PA    Family History Family History  Problem Relation Age of Onset  . Heart disease Mother   . Diabetes Mother   . Hypertension Father   . Diabetes Father   . Kidney failure Father   . Stroke Father   . Hypertension Paternal Grandmother   . Diabetes Paternal Grandmother   . Stroke Paternal Grandmother   . Diabetes Maternal Grandmother   . Ovarian cancer Maternal Grandmother   .  Diabetes Paternal Grandfather     Social History Social History   Tobacco Use  . Smoking status: Never Smoker  . Smokeless tobacco: Never Used  Substance Use Topics  . Alcohol use: No  . Drug use: No     Allergies   Amoxicillin   Review of Systems Review of Systems  Constitutional: Positive for appetite change. Negative for activity change, fatigue and fever.  HENT: Positive for postnasal drip. Negative for congestion, dental problem, ear pain, facial swelling, hearing loss, sinus pain, sore throat, trouble swallowing and voice change.   Eyes: Negative for photophobia, pain and visual disturbance.  Respiratory: Positive for cough. Negative for chest tightness, shortness of breath and wheezing.   Cardiovascular: Negative for chest pain and palpitations.  Gastrointestinal: Negative for diarrhea, nausea and vomiting.  Musculoskeletal: Negative for arthralgias and myalgias.  Neurological: Negative for dizziness and headaches.  Physical Exam Triage Vital Signs ED Triage Vitals  Enc Vitals Group     BP 10/17/19 1342 138/80     Pulse Rate 10/17/19 1342 (!) 104     Resp --      Temp 10/17/19 1342 98.6 F (37 C)     Temp Source 10/17/19 1342 Oral     SpO2 10/17/19 1342 99 %     Weight 10/17/19 1340 236 lb (107 kg)     Height --      Head Circumference --      Peak Flow --      Pain Score 10/17/19 1339 5     Pain Loc --      Pain Edu? --      Excl. in [place]? --    No data found.  Updated Vital Signs BP 138/80 (BP Location: Left Arm)   Pulse (!) 104   Temp 98.6 F (37 C) (Oral)   Wt 236 lb (107 kg)   LMP 10/03/2019   SpO2 99%   BMI 38.09 kg/m   Visual Acuity Right Eye Distance:   Left Eye Distance:   Bilateral Distance:    Right Eye Near:   Left Eye Near:    Bilateral Near:     Physical Exam Constitutional:      General: She is not in acute distress. HENT:     Head: Normocephalic and atraumatic.  Eyes:     General: No scleral icterus.    Pupils:  Pupils are equal, round, and reactive to light.  Cardiovascular:     Rate and Rhythm: Regular rhythm. Tachycardia present.     Comments: HR 92-101 during time with provider. Pulmonary:     Effort: Pulmonary effort is normal. No respiratory distress.     Breath sounds: No wheezing.     Comments: O2 98-99% during time with provider Skin:    Capillary Refill: Capillary refill takes less than 2 seconds.     Coloration: Skin is not jaundiced or pale.  Neurological:     General: No focal deficit present.     Mental Status: She is alert and oriented to person, place, and time.      UC Treatments / Results  Labs (all labs ordered are listed, but only abnormal results are displayed) Labs Reviewed  NOVEL CORONAVIRUS, NAA    EKG   Radiology No results found.  Procedures Procedures (including critical care time)  Medications Ordered in UC Medications - No data to display  Initial Impression / Assessment and Plan / UC Course  I have reviewed the triage vital signs and the nursing notes.  Pertinent labs & imaging results that were available during my care of the patient were reviewed by me and considered in my medical decision making (see chart for details).     Patient afebrile, nontoxic with hemodynamic stability (heart rate on upper end of normal) with adequate oxygen saturation.  Overall appears well, though given recent Covid exposure and comorbidities, Covid testing was obtained which patient tolerated well.  Patient to quarantine until test results are back.  Will trial benzonatate for cough, provide albuterol should patient develop shortness of breath.  Return precautions discussed, patient verbalized understanding and is agreeable to plan. Final Clinical Impressions(s) / UC Diagnoses   Final diagnoses:  Cough     Discharge Instructions     Your COVID test is pending: Is important you quarantine at home until your results are back. You may take OTC Tylenol for fever  and myalgias.  It is important to drink plenty of water throughout the day to stay hydrated. If you test positive and later develop severe fever, cough, or shortness of breath, it is recommended that you go to the ER for further evaluation.    ED Prescriptions    Medication Sig Dispense Auth. Provider   benzonatate (TESSALON) 100 MG capsule Take 1 capsule (100 mg total) by mouth every 8 (eight) hours. 21 capsule Hall-Potvin, [place], PA-C   albuterol (VENTOLIN HFA) 108 (90 Base) MCG/ACT inhaler Inhale 2 puffs into the lungs every 4 (four) hours as needed for wheezing or shortness of breath. 18 g Hall-Potvin, [place], PA-C     PDMP not reviewed this encounter.   Neldon Mc [place], [place] 10/17/19 1841

## 2019-10-17 NOTE — Discharge Instructions (Signed)
Your COVID test is pending: Is important you quarantine at home until your results are back. °You may take OTC Tylenol for fever and myalgias.  It is important to drink plenty of water throughout the day to stay hydrated. °If you test positive and later develop severe fever, cough, or shortness of breath, it is recommended that you go to the ER for further evaluation. °

## 2019-10-17 NOTE — ED Triage Notes (Signed)
Pt. States she had a severe cough for 2 days and has loss her taste and smell. Her sister tested POSITIVE on Tuesday. She was last around her sister on last Sunday. Her symptoms follow with nasal drainage and body aches, every cough hurts.

## 2019-10-17 NOTE — ED Notes (Signed)
Patient able to ambulate independently  

## 2019-10-18 LAB — NOVEL CORONAVIRUS, NAA: SARS-CoV-2, NAA: DETECTED — AB

## 2019-10-19 ENCOUNTER — Telehealth (HOSPITAL_COMMUNITY): Payer: Self-pay | Admitting: Emergency Medicine

## 2019-10-19 NOTE — Telephone Encounter (Signed)
Positive covid, pt contacted and made aware by phone. Answered all her questions, pt states she will call her PCP for further instruction and documentation.

## 2019-11-03 ENCOUNTER — Ambulatory Visit
Admission: EM | Admit: 2019-11-03 | Discharge: 2019-11-03 | Disposition: A | Discharge status: Home / Self Care | Payer: BC Managed Care – PPO | Attending: Emergency Medicine | Admitting: Emergency Medicine

## 2019-11-03 ENCOUNTER — Other Ambulatory Visit: Payer: Self-pay

## 2019-11-03 DIAGNOSIS — N760 Acute vaginitis: Secondary | ICD-10-CM

## 2019-11-03 DIAGNOSIS — B9689 Other specified bacterial agents as the cause of diseases classified elsewhere: Secondary | ICD-10-CM

## 2019-11-03 MED ORDER — FLUCONAZOLE 200 MG PO TABS: 200.0000 mg | ORAL_TABLET | Freq: Once | ORAL | 0 refills | Status: AC

## 2019-11-03 NOTE — ED Triage Notes (Signed)
Pt c/o yeast infection x3 days

## 2019-11-03 NOTE — Discharge Instructions (Addendum)
Take diflucan as directed. May to Sitz bath for additional relief. Change tampons/pads more frequently. Important to control sugars as this can increase frequency of UTI/yeast infection.

## 2019-11-03 NOTE — ED Provider Notes (Signed)
[name]    CSN: [id number] Arrival date & time: 11/03/19  1752      History   Chief Complaint Chief Complaint  Patient presents with  . Vaginitis    HPI Erica Nguyen is a 48 y.o. female with history of type 2 diabetes, hypertension, CAD presenting for vaginal irritation, pruritus for the last 3 days.  Patient denying pelvic pain, abdominal pain, urinary symptoms.  Patient does have scant, thick white discharge.  Patient also endorsing history of yeast infections, states this feels similar.  Patient currently on her cycle.  Has tried Monistat without significant relief.  Patient states her sugars "go up and down", though denies symptoms of poor control.    Past Medical History:  Diagnosis Date  . Abnormal Pap smear   . CAD (coronary artery disease)   . Diabetes mellitus    insulin-dependent type 2  . Family history of adverse reaction to anesthesia    sister has n/v  . Fibroid   . HNP (herniated nucleus pulposus)   . Hypertension    chronic hypertension    Patient Active Problem List   Diagnosis Date Noted  . NSTEMI (non-ST elevated myocardial infarction) ([place]) 07/31/2017  . Acute non Q wave myocardial infarction ([place]) 07/20/2017  . Ambulatory dysfunction   . Intractable pain   . Uncontrolled type 2 diabetes mellitus without complication, with long-term current use of insulin   . Sciatica of right side   . Hyperglycemia 10/09/2016  . Lumbar herniated disc 10/09/2016  . Myelopathy ([place]) 10/09/2016  . Hypercholesterolemia 01/23/2016  . Vitamin D deficiency 01/23/2016  . Vaginal candidiasis 11/29/2015  . Spinal stenosis of lumbar region 11/22/2015  . Obesity, Class II, BMI 35-39.9, with comorbidity 10/15/2015  . Lumbosacral radiculopathy due to osteoarthritis of spine 06/10/2015  . Uncontrolled type 2 diabetes mellitus with hyperglycemia, with long-term current use of insulin ([place]) 06/10/2015  . Fibroids, intramural 06/27/2014  . Chest pain  01/06/2012  . Dyspnea 01/06/2012  . Controlled type 2 diabetes mellitus with hyperglycemia ([place]) 09/25/2007  . Essential hypertension 09/25/2007    Past Surgical History:  Procedure Laterality Date  . CARDIAC CATHETERIZATION  02/29/2008   Normal coronary arteries -- Normal LV (left ventricular) systolic function -- Mild to moderate elevation in left ventricular end-diastolic  pressure secondary to hypertension, diabetes and obesity  . CHOLECYSTECTOMY  1995  . COLPOSCOPY    . CORONARY STENT INTERVENTION N/A 07/21/2017   Procedure: Coronary Stent Intervention;  Surgeon: [name], MD;  Location: [facility];  Service: Cardiovascular;  Laterality: N/A;  . DECOMPRESSIVE LUMBAR LAMINECTOMY LEVEL 1 Right 11/22/2015   Procedure: DECOMPRESSIVE LUMBAR LAMINECTOMY L5-S1 ON RIGHT,FORAMINOTOMIES L5,S1 DISCECTOMY L5,S1;  Surgeon: Susa Day, MD;  Location: [facility];  Service: Orthopedics;  Laterality: Right;  . GYNECOLOGIC CRYOSURGERY  1998  . Hysteroscopy, D&C, Novasure ablation, removal of Intrauterine device  01/08/2008  . LEFT HEART CATH AND CORONARY ANGIOGRAPHY N/A 07/21/2017   Procedure: Left Heart Cath and Coronary Angiography;  Surgeon: [name], MD;  Location: [facility];  Service: Cardiovascular;  Laterality: N/A;  . LUMBAR LAMINECTOMY/DECOMPRESSION MICRODISCECTOMY Right 10/11/2016   Procedure: REVISION MICRO LUMBAR DECOMPRESSION L5-S1 RIGHT;  Surgeon: Susa Day, MD;  Location: [facility];  Service: Orthopedics;  Laterality: Right;  . NOVASURE ABLATION  01/08/2008    OB History    Gravida  3   Para  1   Term  1   Preterm      AB  2   Living  1     SAB  2   TAB      Ectopic      Multiple      Live Births  1            Home Medications    Prior to Admission medications   Medication Sig Start Date End Date Taking? Authorizing Provider  acetaminophen (TYLENOL) 500 MG tablet Take 1,000 mg by mouth every 6 (six) hours as needed for moderate  pain or headache.    [provider]  aspirin EC 81 MG EC tablet Take 1 tablet (81 mg total) by mouth daily. 07/24/17   [name], MD  fluconazole (DIFLUCAN) 200 MG tablet Take 1 tablet (200 mg total) by mouth once for 1 dose. May repeat in 72 hours if needed 11/03/19 11/03/19  Hall-Potvin, [place], PA-C  glucose blood (PRECISION QID TEST) test strip For blood sugar monitoring 3x daily with the One Touch Verio Flex glucometer 10/22/18   [provider]  insulin lispro (HUMALOG) 100 UNIT/ML injection Inject 20 Units into the skin daily before supper.     [provider]  LANTUS SOLOSTAR 100 UNIT/ML Solostar Pen INJECT 50 UNITS DOSE INTO THE SKIN DAILY. 03/10/19   [provider]  lisinopril (ZESTRIL) 40 MG tablet Take 1 tablet (40 mg total) by mouth daily. 09/22/19   Almyra Deforest, PA  metoprolol succinate (TOPROL-XL) 50 MG 24 hr tablet Take 1 tablet (50 mg total) by mouth 2 (two) times daily. 09/22/19   Almyra Deforest, PA  nitroGLYCERIN (NITROSTAT) 0.4 MG SL tablet Place 1 tablet (0.4 mg total) under the tongue every 5 (five) minutes x 3 doses as needed for chest pain. 07/23/17   [name], MD  rosuvastatin (CRESTOR) 40 MG tablet Take 1 tablet (40 mg total) by mouth daily. 01/01/19 09/22/19  Almyra Deforest, PA    Family History Family History  Problem Relation Age of Onset  . Heart disease Mother   . Diabetes Mother   . Hypertension Father   . Diabetes Father   . Kidney failure Father   . Stroke Father   . Hypertension Paternal Grandmother   . Diabetes Paternal Grandmother   . Stroke Paternal Grandmother   . Diabetes Maternal Grandmother   . Ovarian cancer Maternal Grandmother   . Diabetes Paternal Grandfather     Social History Social History   Tobacco Use  . Smoking status: Never Smoker  . Smokeless tobacco: Never Used  Substance Use Topics  . Alcohol use: No  . Drug use: No     Allergies   Amoxicillin   Review of Systems Review of Systems   Constitutional: Negative for fatigue and fever.  Respiratory: Negative for cough and shortness of breath.   Cardiovascular: Negative for chest pain and palpitations.  Gastrointestinal: Negative for constipation and diarrhea.  Endocrine: Negative for polydipsia, polyphagia and polyuria.  Genitourinary: Positive for vaginal discharge. Negative for dysuria, flank pain, frequency, hematuria, pelvic pain, urgency and vaginal bleeding.       Positive for vaginal irritation     Physical Exam Triage Vital Signs ED Triage Vitals  Enc Vitals Group     BP      Pulse      Resp      Temp      Temp src      SpO2      Weight      Height      Head Circumference  Peak Flow      Pain Score      Pain Loc      Pain Edu?      Excl. in [place]?    No data found.  Updated Vital Signs BP 137/86 (BP Location: Left Arm)   Pulse 98   Temp 98.2 F (36.8 C) (Oral)   Resp 20   LMP 11/03/2019   Visual Acuity Right Eye Distance:   Left Eye Distance:   Bilateral Distance:    Right Eye Near:   Left Eye Near:    Bilateral Near:     Physical Exam Constitutional:      General: She is not in acute distress. HENT:     Head: Normocephalic and atraumatic.  Eyes:     General: No scleral icterus.    Pupils: Pupils are equal, round, and reactive to light.  Cardiovascular:     Rate and Rhythm: Normal rate.  Pulmonary:     Effort: Pulmonary effort is normal.  Abdominal:     General: Bowel sounds are normal.     Palpations: Abdomen is soft.     Tenderness: There is no abdominal tenderness. There is no right CVA tenderness, left CVA tenderness or guarding.  Genitourinary:    Comments: Patient declined Skin:    Coloration: Skin is not jaundiced or pale.  Neurological:     Mental Status: She is alert and oriented to person, place, and time.      UC Treatments / Results  Labs (all labs ordered are listed, but only abnormal results are displayed) Labs Reviewed - No data to display  EKG    Radiology No results found.  Procedures Procedures (including critical care time)  Medications Ordered in UC Medications - No data to display  Initial Impression / Assessment and Plan / UC Course  I have reviewed the triage vital signs and the nursing notes.  Pertinent labs & imaging results that were available during my care of the patient were reviewed by me and considered in my medical decision making (see chart for details).     Final Clinical Impressions(s) / UC Diagnoses   Final diagnoses:  Acute vaginitis     Discharge Instructions     Take diflucan as directed. May to Sitz bath for additional relief. Change tampons/pads more frequently. Important to control sugars as this can increase frequency of UTI/yeast infection.     ED Prescriptions    Medication Sig Dispense Auth. Provider   fluconazole (DIFLUCAN) 200 MG tablet Take 1 tablet (200 mg total) by mouth once for 1 dose. May repeat in 72 hours if needed 2 tablet Hall-Potvin, [place], PA-C     PDMP not reviewed this encounter.   Neldon Mc [place], [place] 11/03/19 1830

## 2019-12-08 ENCOUNTER — Other Ambulatory Visit: Payer: Self-pay | Admitting: Family Medicine

## 2019-12-08 DIAGNOSIS — Z1231 Encounter for screening mammogram for malignant neoplasm of breast: Secondary | ICD-10-CM

## 2019-12-27 ENCOUNTER — Encounter: Payer: Self-pay | Admitting: Family Medicine

## 2019-12-27 ENCOUNTER — Ambulatory Visit (INDEPENDENT_AMBULATORY_CARE_PROVIDER_SITE_OTHER): Payer: BC Managed Care – PPO | Admitting: Family Medicine

## 2019-12-27 ENCOUNTER — Other Ambulatory Visit: Payer: Self-pay

## 2019-12-27 ENCOUNTER — Ambulatory Visit: Payer: Self-pay

## 2019-12-27 DIAGNOSIS — M542 Cervicalgia: Secondary | ICD-10-CM | POA: Diagnosis not present

## 2019-12-27 DIAGNOSIS — M25512 Pain in left shoulder: Secondary | ICD-10-CM

## 2019-12-27 DIAGNOSIS — G8929 Other chronic pain: Secondary | ICD-10-CM

## 2019-12-27 DIAGNOSIS — M7502 Adhesive capsulitis of left shoulder: Secondary | ICD-10-CM

## 2019-12-27 MED ORDER — BACLOFEN 10 MG PO TABS: 5.0000 mg | ORAL_TABLET | Freq: Every evening | ORAL | 3 refills | Status: DC | PRN

## 2019-12-27 MED ORDER — GABAPENTIN 300 MG PO CAPS: ORAL_CAPSULE | ORAL | 3 refills | Status: DC

## 2019-12-27 MED ORDER — VITAMIN D-3 125 MCG (5000 UT) PO TABS: 1.0000 | ORAL_TABLET | Freq: Every day | ORAL | 3 refills | Status: DC

## 2019-12-27 NOTE — Progress Notes (Signed)
I saw and examined the patient with Dr. Mayer Masker and agree with assessment and plan as outlined.    Ongoing left adhesive capsulitis, but also with left arm radicular symptoms.  Will order x-rays and MRI c-spine.  Try gabapentin, muscle relaxant.  Consider PT depending on MRI results.

## 2019-12-27 NOTE — Progress Notes (Signed)
Erica Nguyen - 49 y.o. female MRN [id number]  Date of birth: [birth year]  Office Visit Note: Visit Date: 12/27/2019 PCP: Elisabeth Cara, PA-C Referred by: Belva Bertin, [place], *  Subjective: Chief Complaint  Patient presents with  . Left Shoulder - Pain    Pain in the shoulder has worsened over the past 2 weeks. Pain has moved into the right side of the neck. Occasional numbness in the left hand. Right-hand dominant. Tried lido patch, aleve, ibuprofen, tylenol & diclofenac.   HPI: Erica Nguyen is a 49 y.o. female who comes in today for folllow up of left shoulder pain.   She was last seen on 10/11/2019, had glenohumeral injection with pain relief for 1 week. She reports that she has pain with any shoulder movement at all. Pain keeps her up at night. She has recently started getting pain in left lateral neck as well. She has noticed numbness in left hand and weakness in left arm and hand in general. Right handed. She has tried ibuprofen, tylenol, diclofenac with no improvement. She has also tried lidocaine patches without relief. At last visit, discussed going to PT but she was scared it would be painful so she did not go.    ROS Otherwise per HPI.  Assessment & Plan: Visit Diagnoses:  1. Chronic left shoulder pain   2. Adhesive capsulitis of left shoulder   3. Neck pain   4. Cervicalgia     Plan:  49 yo female with T2Dm presenting with chronic shoulder pain. Given numbness in hand and weakness in C5-C7 muscles, concerned that she is having cervical radiculopathy that is causing shoulder pain and radicular symptoms. Will obtain MRI of cervical spine. For pain, will trial gabapentin 300 mg at night as well as alternative muscle relaxant. Will also treat with vitamin D in cause vitamin D deficiency is contributing to pain.   Meds & Orders:  Meds ordered this encounter  Medications  . gabapentin (NEURONTIN) 300 MG capsule    Sig: 1 PO q HS, may increase to 1 PO TID if  needed    Dispense:  90 capsule    Refill:  3  . baclofen (LIORESAL) 10 MG tablet    Sig: Take 0.5-1 tablets (5-10 mg total) by mouth at bedtime as needed for muscle spasms.    Dispense:  30 each    Refill:  3  . Cholecalciferol (VITAMIN D-3) 125 MCG (5000 UT) TABS    Sig: Take 1 tablet by mouth daily.    Dispense:  90 tablet    Refill:  3    Orders Placed This Encounter  Procedures  . XR Cervical Spine 2 or 3 views  . MR Cervical Spine w/o contrast    Follow-up: PRN  Procedures: No procedures performed  No notes on file   Clinical History: No specialty comments available.   She reports that she has never smoked. She has never used smokeless tobacco. No results for input(s): HGBA1C, LABURIC in the last 8760 hours.  Objective:  VS:  HT:    WT:   BMI:     BP:   HR: bpm  TEMP: ( )  RESP:  Physical Exam  PHYSICAL EXAM: Gen: NAD, alert, cooperative with exam, well-appearing HEENT: clear conjunctiva,  CV:  no edema, capillary refill brisk, normal rate Resp: non-labored Skin: no rashes, normal turgor  Neuro: no gross deficits.  Psych:  alert and oriented  Ortho Exam   Left Shoulder: Inspection reveals asymmetry  with left shoulder lower than right. No bruising. No swelling TTP over lateral subacromial space, bicpicital groove Limited ROM in flexion (~60degrees), abduction (~50 degrees),very limited internal and external rotation.Passive external rotation limitedto20 degrees.  4/5 wrist flexion, extension, biceps, triceps, internal and external rotation. 4/5 grip strength and OK sign (compared to 5/5 on contralateral side for all testing) Normal sensation in arm and hand  Pulses strong Imaging: No results found.  Past Medical/Family/Surgical/Social History: Medications & Allergies reviewed per EMR, new medications updated. Patient Active Problem List   Diagnosis Date Noted  . NSTEMI (non-ST elevated myocardial infarction) ([place]) 07/31/2017  . Acute non Q wave  myocardial infarction ([place]) 07/20/2017  . Ambulatory dysfunction   . Intractable pain   . Uncontrolled type 2 diabetes mellitus without complication, with long-term current use of insulin   . Sciatica of right side   . Hyperglycemia 10/09/2016  . Lumbar herniated disc 10/09/2016  . Myelopathy ([place]) 10/09/2016  . Hypercholesterolemia 01/23/2016  . Vitamin D deficiency 01/23/2016  . Vaginal candidiasis 11/29/2015  . Spinal stenosis of lumbar region 11/22/2015  . Obesity, Class II, BMI 35-39.9, with comorbidity 10/15/2015  . Lumbosacral radiculopathy due to osteoarthritis of spine 06/10/2015  . Uncontrolled type 2 diabetes mellitus with hyperglycemia, with long-term current use of insulin ([place]) 06/10/2015  . Fibroids, intramural 06/27/2014  . Chest pain 01/06/2012  . Dyspnea 01/06/2012  . Controlled type 2 diabetes mellitus with hyperglycemia ([place]) 09/25/2007  . Essential hypertension 09/25/2007   Past Medical History:  Diagnosis Date  . Abnormal Pap smear   . CAD (coronary artery disease)   . Diabetes mellitus    insulin-dependent type 2  . Family history of adverse reaction to anesthesia    sister has n/v  . Fibroid   . HNP (herniated nucleus pulposus)   . Hypertension    chronic hypertension   Family History  Problem Relation Age of Onset  . Heart disease Mother   . Diabetes Mother   . Hypertension Father   . Diabetes Father   . Kidney failure Father   . Stroke Father   . Hypertension Paternal Grandmother   . Diabetes Paternal Grandmother   . Stroke Paternal Grandmother   . Diabetes Maternal Grandmother   . Ovarian cancer Maternal Grandmother   . Diabetes Paternal Grandfather    Past Surgical History:  Procedure Laterality Date  . CARDIAC CATHETERIZATION  02/29/2008   Normal coronary arteries -- Normal LV (left ventricular) systolic function -- Mild to moderate elevation in left ventricular end-diastolic  pressure secondary to hypertension, diabetes and obesity  .  CHOLECYSTECTOMY  1995  . COLPOSCOPY    . CORONARY STENT INTERVENTION N/A 07/21/2017   Procedure: Coronary Stent Intervention;  Surgeon: [name], MD;  Location: [facility];  Service: Cardiovascular;  Laterality: N/A;  . DECOMPRESSIVE LUMBAR LAMINECTOMY LEVEL 1 Right 11/22/2015   Procedure: DECOMPRESSIVE LUMBAR LAMINECTOMY L5-S1 ON RIGHT,FORAMINOTOMIES L5,S1 DISCECTOMY L5,S1;  Surgeon: Susa Day, MD;  Location: [facility];  Service: Orthopedics;  Laterality: Right;  . GYNECOLOGIC CRYOSURGERY  1998  . Hysteroscopy, D&C, Novasure ablation, removal of Intrauterine device  01/08/2008  . LEFT HEART CATH AND CORONARY ANGIOGRAPHY N/A 07/21/2017   Procedure: Left Heart Cath and Coronary Angiography;  Surgeon: [name], MD;  Location: [facility];  Service: Cardiovascular;  Laterality: N/A;  . LUMBAR LAMINECTOMY/DECOMPRESSION MICRODISCECTOMY Right 10/11/2016   Procedure: REVISION MICRO LUMBAR DECOMPRESSION L5-S1 RIGHT;  Surgeon: Susa Day, MD;  Location: [facility];  Service: Orthopedics;  Laterality: Right;  . [place]  01/08/2008   Social History   Occupational History  . Occupation: [employer], consierge  Tobacco Use  . Smoking status: Never Smoker  . Smokeless tobacco: Never Used  Substance and Sexual Activity  . Alcohol use: No  . Drug use: No  . Sexual activity: Yes    Birth control/protection: Surgical    Comment: ablation

## 2020-01-03 ENCOUNTER — Other Ambulatory Visit: Payer: Self-pay | Admitting: Physician Assistant

## 2020-01-03 DIAGNOSIS — E785 Hyperlipidemia, unspecified: Secondary | ICD-10-CM

## 2020-01-13 ENCOUNTER — Encounter: Payer: Self-pay | Admitting: Family Medicine

## 2020-01-14 ENCOUNTER — Telehealth: Payer: Self-pay | Admitting: Family Medicine

## 2020-01-14 NOTE — Telephone Encounter (Signed)
Cervical MRI report shows:  Disc protrusion at C5-6 without nerve compression.  Disc protrusion at C7-T1 resulting in contact with the spinal cord.    Mild disc bulge at C4-5 and C6-7 without nerve or spinal cord contact.  The C7-T1 disc could be the source of the left shoulder pain.

## 2020-01-24 ENCOUNTER — Telehealth: Payer: Self-pay | Admitting: Family Medicine

## 2020-01-24 DIAGNOSIS — M542 Cervicalgia: Secondary | ICD-10-CM

## 2020-01-24 NOTE — Addendum Note (Signed)
Addended by: Hortencia Pilar on: 01/24/2020 02:00 PM   Modules accepted: Orders

## 2020-01-24 NOTE — Telephone Encounter (Signed)
I called and advised the patient of her MRI results (she could not get into MyChart). She wishes to proceed with the PT option (no preference to location).

## 2020-01-24 NOTE — Telephone Encounter (Signed)
Patient called.   She was wondering why she hadn't received a call back about her MRI that was done about two weeks ago. I informed her that an MRI review appointment had to be made first and did so for her.   Call back number: [phone]

## 2020-01-24 NOTE — Telephone Encounter (Signed)
Orders placed for our PT department.

## 2020-01-25 ENCOUNTER — Ambulatory Visit: Payer: BC Managed Care – PPO

## 2020-01-25 NOTE — Progress Notes (Signed)
HPI: FU CAD. Admitted 7/18 with NSTEMI (peak troponin 0.79). Cared for by Dr Terrence Dupont. Cath revealed EF 35-45; 75 RCA, 75 distal LAD; pt had PCI of RCA and LAD with DES.   Echocardiogram December 2018 showed normal LV systolic function, grade 2 diastolic dysfunction.  Nuclear study January 2020 showed ejection fraction 54%, breast attenuation but no ischemia.  Since last seen she has some dyspnea with more moderate activities.  Occasional brief pain in chest 2 weeks ago for 1 to 2 seconds.  No exertional chest pain or syncope.  Current Outpatient Medications  Medication Sig Dispense Refill  . acetaminophen (TYLENOL) 500 MG tablet Take 1,000 mg by mouth every 6 (six) hours as needed for moderate pain or headache.    Marland Kitchen aspirin EC 81 MG EC tablet Take 1 tablet (81 mg total) by mouth daily. 30 tablet 3  . baclofen (LIORESAL) 10 MG tablet Take 0.5-1 tablets (5-10 mg total) by mouth at bedtime as needed for muscle spasms. 30 each 3  . Cholecalciferol (VITAMIN D-3) 125 MCG (5000 UT) TABS Take 1 tablet by mouth daily. 90 tablet 3  . Dulaglutide (TRULICITY) A999333 0000000 SOPN Inject into the skin.    Marland Kitchen gabapentin (NEURONTIN) 300 MG capsule 1 PO q HS, may increase to 1 PO TID if needed 90 capsule 3  . glucose blood (PRECISION QID TEST) test strip For blood sugar monitoring 3x daily with the One Touch Verio Flex glucometer    . insulin lispro (HUMALOG) 100 UNIT/ML injection Inject 20 Units into the skin daily before supper.     Marland Kitchen LANTUS SOLOSTAR 100 UNIT/ML Solostar Pen INJECT 50 UNITS DOSE INTO THE SKIN DAILY.    Marland Kitchen lisinopril (ZESTRIL) 40 MG tablet Take 1 tablet (40 mg total) by mouth daily. 90 tablet 2  . metoprolol succinate (TOPROL-XL) 50 MG 24 hr tablet Take 1 tablet (50 mg total) by mouth 2 (two) times daily. 180 tablet 1  . nitroGLYCERIN (NITROSTAT) 0.4 MG SL tablet Place 1 tablet (0.4 mg total) under the tongue every 5 (five) minutes x 3 doses as needed for chest pain. 25 tablet 12  .  rosuvastatin (CRESTOR) 40 MG tablet TAKE 1 TABLET BY MOUTH EVERY DAY 90 tablet 3   No current facility-administered medications for this visit.     Past Medical History:  Diagnosis Date  . Abnormal Pap smear   . CAD (coronary artery disease)   . Diabetes mellitus    insulin-dependent type 2  . Family history of adverse reaction to anesthesia    sister has n/v  . Fibroid   . HNP (herniated nucleus pulposus)   . Hypertension    chronic hypertension    Past Surgical History:  Procedure Laterality Date  . CARDIAC CATHETERIZATION  02/29/2008   Normal coronary arteries -- Normal LV (left ventricular) systolic function -- Mild to moderate elevation in left ventricular end-diastolic  pressure secondary to hypertension, diabetes and obesity  . CHOLECYSTECTOMY  1995  . COLPOSCOPY    . CORONARY STENT INTERVENTION N/A 07/21/2017   Procedure: Coronary Stent Intervention;  Surgeon: [name], MD;  Location: [facility];  Service: Cardiovascular;  Laterality: N/A;  . DECOMPRESSIVE LUMBAR LAMINECTOMY LEVEL 1 Right 11/22/2015   Procedure: DECOMPRESSIVE LUMBAR LAMINECTOMY L5-S1 ON RIGHT,FORAMINOTOMIES L5,S1 DISCECTOMY L5,S1;  Surgeon: Susa Day, MD;  Location: [facility];  Service: Orthopedics;  Laterality: Right;  . GYNECOLOGIC CRYOSURGERY  1998  . Hysteroscopy, D&C, Novasure ablation, removal of Intrauterine device  01/08/2008  . LEFT HEART CATH AND CORONARY ANGIOGRAPHY N/A 07/21/2017   Procedure: Left Heart Cath and Coronary Angiography;  Surgeon: [name], MD;  Location: [facility];  Service: Cardiovascular;  Laterality: N/A;  . LUMBAR LAMINECTOMY/DECOMPRESSION MICRODISCECTOMY Right 10/11/2016   Procedure: REVISION MICRO LUMBAR DECOMPRESSION L5-S1 RIGHT;  Surgeon: Susa Day, MD;  Location: [facility];  Service: Orthopedics;  Laterality: Right;  . [place]  01/08/2008    Social History   Socioeconomic History  . Marital status: Single    Spouse name: Not on  file  . Number of children: 1  . Years of education: Not on file  . Highest education level: Not on file  Occupational History  . Occupation: [employer], consierge  Tobacco Use  . Smoking status: Never Smoker  . Smokeless tobacco: Never Used  Substance and Sexual Activity  . Alcohol use: No  . Drug use: No  . Sexual activity: Yes    Birth control/protection: Surgical    Comment: ablation  Other Topics Concern  . Not on file  Social History Narrative  . Not on file   Social Determinants of Health   Financial Resource Strain:   . Difficulty of Paying Living Expenses: Not on file  Food Insecurity:   . Worried About Charity fundraiser in the Last Year: Not on file  . Ran Out of Food in the Last Year: Not on file  Transportation Needs:   . Lack of Transportation (Medical): Not on file  . Lack of Transportation (Non-Medical): Not on file  Physical Activity:   . Days of Exercise per Week: Not on file  . Minutes of Exercise per Session: Not on file  Stress:   . Feeling of Stress : Not on file  Social Connections:   . Frequency of Communication with Friends and Family: Not on file  . Frequency of Social Gatherings with Friends and Family: Not on file  . Attends Religious Services: Not on file  . Active Member of Clubs or Organizations: Not on file  . Attends Archivist Meetings: Not on file  . Marital Status: Not on file  Intimate Partner Violence:   . Fear of Current or Ex-Partner: Not on file  . Emotionally Abused: Not on file  . Physically Abused: Not on file  . Sexually Abused: Not on file    Family History  Problem Relation Age of Onset  . Heart disease Mother   . Diabetes Mother   . Hypertension Father   . Diabetes Father   . Kidney failure Father   . Stroke Father   . Hypertension Paternal Grandmother   . Diabetes Paternal Grandmother   . Stroke Paternal Grandmother   . Diabetes Maternal Grandmother   . Ovarian cancer Maternal Grandmother   . Diabetes  Paternal Grandfather     ROS: no fevers or chills, productive cough, hemoptysis, dysphasia, odynophagia, melena, hematochezia, dysuria, hematuria, rash, seizure activity, orthopnea, PND, pedal edema, claudication. Remaining systems are negative.  Physical Exam: Well-developed obese in no acute distress.  Skin is warm and dry.  HEENT is normal.  Neck is supple.  Chest is clear to auscultation with normal expansion.  Cardiovascular exam is regular rate and rhythm.  Abdominal exam nontender or distended. No masses palpated. Extremities show no edema. neuro grossly intact  ECG-sinus rhythm at a rate of 69, no ST changes.  Personally reviewed  A/P  1 coronary artery disease- Continue medical therapy with aspirin and statin.  2  ischemic cardiomyopathy-continue beta-blocker and ACE inhibitor.  LV function improved on most recent study.  3 hypertension-blood pressure controlled.  Continue present medical regimen.  Check potassium and renal function.  4 hyperlipidemia-continue statin.  Check lipids and liver.  5 obesity-we discussed diet, exercise and weight loss.  6 chest pain-brief 1 to 2 seconds of atypical chest pain but no exertional symptoms.  Electrocardiogram shows no new ST changes.  Previous nuclear study showed no ischemia.  We will not pursue further evaluation at this time.  Kirk Ruths, MD

## 2020-01-28 ENCOUNTER — Ambulatory Visit: Payer: BC Managed Care – PPO | Admitting: Family Medicine

## 2020-02-01 ENCOUNTER — Other Ambulatory Visit: Payer: Self-pay

## 2020-02-01 ENCOUNTER — Ambulatory Visit (INDEPENDENT_AMBULATORY_CARE_PROVIDER_SITE_OTHER): Payer: BC Managed Care – PPO | Admitting: Cardiology

## 2020-02-01 ENCOUNTER — Encounter: Payer: Self-pay | Admitting: Cardiology

## 2020-02-01 VITALS — BP 124/78 | HR 79 | Ht 66.0 in | Wt 261.6 lb

## 2020-02-01 DIAGNOSIS — E785 Hyperlipidemia, unspecified: Secondary | ICD-10-CM | POA: Diagnosis not present

## 2020-02-01 DIAGNOSIS — R072 Precordial pain: Secondary | ICD-10-CM | POA: Diagnosis not present

## 2020-02-01 DIAGNOSIS — I2510 Atherosclerotic heart disease of native coronary artery without angina pectoris: Secondary | ICD-10-CM | POA: Diagnosis not present

## 2020-02-01 DIAGNOSIS — I10 Essential (primary) hypertension: Secondary | ICD-10-CM | POA: Diagnosis not present

## 2020-02-01 NOTE — Patient Instructions (Signed)
Medication Instructions:  NO CHANGE *If you need a refill on your cardiac medications before your next appointment, please call your pharmacy*  Lab Work: Your physician recommends that you return for lab work PRIOR TO EATING  If you have labs (blood work) drawn today and your tests are completely normal, you will receive your results only by: . MyChart Message (if you have MyChart) OR . A paper copy in the mail If you have any lab test that is abnormal or we need to change your treatment, we will call you to review the results.  Follow-Up: At [facility], you and your health needs are our priority.  As part of our continuing mission to provide you with exceptional heart care, we have created designated Provider Care Teams.  These Care Teams include your primary Cardiologist (physician) and Advanced Practice Providers (APPs -  Physician Assistants and Nurse Practitioners) who all work together to provide you with the care you need, when you need it.  Your next appointment:   12 month(s)  The format for your next appointment:   Either In Person or Virtual  Provider:   You may see Brian Crenshaw, MD or one of the following Advanced Practice Providers on your designated Care Team:    Luke Kilroy, PA-C  Callie Goodrich, PA-C  Jesse Cleaver, FNP    

## 2020-02-02 ENCOUNTER — Encounter: Payer: Self-pay | Admitting: Advanced Practice Midwife

## 2020-02-02 ENCOUNTER — Ambulatory Visit (INDEPENDENT_AMBULATORY_CARE_PROVIDER_SITE_OTHER): Payer: BC Managed Care – PPO | Admitting: Advanced Practice Midwife

## 2020-02-02 VITALS — BP 127/78 | HR 72 | Temp 98.3°F | Ht 66.0 in | Wt 258.0 lb

## 2020-02-02 DIAGNOSIS — N760 Acute vaginitis: Secondary | ICD-10-CM | POA: Diagnosis not present

## 2020-02-02 DIAGNOSIS — Z113 Encounter for screening for infections with a predominantly sexual mode of transmission: Secondary | ICD-10-CM | POA: Diagnosis not present

## 2020-02-02 DIAGNOSIS — B9689 Other specified bacterial agents as the cause of diseases classified elsewhere: Secondary | ICD-10-CM

## 2020-02-02 DIAGNOSIS — Z01419 Encounter for gynecological examination (general) (routine) without abnormal findings: Secondary | ICD-10-CM | POA: Diagnosis not present

## 2020-02-02 DIAGNOSIS — Z124 Encounter for screening for malignant neoplasm of cervix: Secondary | ICD-10-CM

## 2020-02-02 DIAGNOSIS — B373 Candidiasis of vulva and vagina: Secondary | ICD-10-CM

## 2020-02-02 DIAGNOSIS — Z1151 Encounter for screening for human papillomavirus (HPV): Secondary | ICD-10-CM

## 2020-02-02 DIAGNOSIS — D219 Benign neoplasm of connective and other soft tissue, unspecified: Secondary | ICD-10-CM

## 2020-02-02 DIAGNOSIS — N898 Other specified noninflammatory disorders of vagina: Secondary | ICD-10-CM | POA: Diagnosis not present

## 2020-02-02 NOTE — Patient Instructions (Signed)

## 2020-02-02 NOTE — Progress Notes (Addendum)
GYNECOLOGY ANNUAL PREVENTATIVE CARE ENCOUNTER NOTE  History:     Erica Nguyen is a 49 y.o. [id number] female here for a routine annual gynecologic exam.  Current complaints: recurrent abdominal cramping since Thursday 01/27/2020 .   Denies abnormal vaginal bleeding, discharge, pelvic pain, problems with intercourse or other gynecologic concerns.    Patient lives with her son and works from home. She is not currently exercising. She is in a monogamous relationship with her boyfriend of one year. She denies SI, HI, IPV.  Patient requests swabs for STI, declines blood work and verbalizes awareness that vaginal swabs do not constitute full STI screening.    Gynecologic History No LMP recorded. Contraception: none Last Pap: 2019. Results were: normal with negative HPV Last mammogram: 11/16/2018. Results were: normal  Obstetric History OB History  Gravida Para Term Preterm AB Living  3 1 1   2 1   SAB TAB Ectopic Multiple Live Births  2       1    # Outcome Date GA Lbr Len/2nd Weight Sex Delivery Anes PTL Lv  3 SAB           2 SAB           1 Term     M Vag-Spont   LIV    Past Medical History:  Diagnosis Date  . Abnormal Pap smear   . CAD (coronary artery disease)   . Diabetes mellitus    insulin-dependent type 2  . Family history of adverse reaction to anesthesia    sister has n/v  . Fibroid   . HNP (herniated nucleus pulposus)   . Hypertension    chronic hypertension  . Pregnancy induced hypertension     Past Surgical History:  Procedure Laterality Date  . CARDIAC CATHETERIZATION  02/29/2008   Normal coronary arteries -- Normal LV (left ventricular) systolic function -- Mild to moderate elevation in left ventricular end-diastolic  pressure secondary to hypertension, diabetes and obesity  . CHOLECYSTECTOMY  1995  . COLPOSCOPY    . CORONARY STENT INTERVENTION N/A 07/21/2017   Procedure: Coronary Stent Intervention;  Surgeon: [name], MD;  Location: [facility];  Service: Cardiovascular;  Laterality: N/A;  . DECOMPRESSIVE LUMBAR LAMINECTOMY LEVEL 1 Right 11/22/2015   Procedure: DECOMPRESSIVE LUMBAR LAMINECTOMY L5-S1 ON RIGHT,FORAMINOTOMIES L5,S1 DISCECTOMY L5,S1;  Surgeon: Susa Day, MD;  Location: [facility];  Service: Orthopedics;  Laterality: Right;  . GYNECOLOGIC CRYOSURGERY  1998  . Hysteroscopy, D&C, Novasure ablation, removal of Intrauterine device  01/08/2008  . LEFT HEART CATH AND CORONARY ANGIOGRAPHY N/A 07/21/2017   Procedure: Left Heart Cath and Coronary Angiography;  Surgeon: [name], MD;  Location: [place] [facility];  Service: Cardiovascular;  Laterality: N/A;  . LUMBAR LAMINECTOMY/DECOMPRESSION MICRODISCECTOMY Right 10/11/2016   Procedure: REVISION MICRO LUMBAR DECOMPRESSION L5-S1 RIGHT;  Surgeon: Susa Day, MD;  Location: [facility];  Service: Orthopedics;  Laterality: Right;  . NOVASURE ABLATION  01/08/2008    Current Outpatient Medications on File Prior to Visit  Medication Sig Dispense Refill  . acetaminophen (TYLENOL) 500 MG tablet Take 1,000 mg by mouth every 6 (six) hours as needed for moderate pain or headache.    Marland Kitchen aspirin EC 81 MG EC tablet Take 1 tablet (81 mg total) by mouth daily. 30 tablet 3  . baclofen (LIORESAL) 10 MG tablet Take 0.5-1 tablets (5-10 mg total) by mouth at bedtime as needed for muscle spasms. 30 each 3  . Cholecalciferol (VITAMIN D-3) 125  MCG (5000 UT) TABS Take 1 tablet by mouth daily. 90 tablet 3  . Dulaglutide (TRULICITY) A999333 0000000 SOPN Inject into the skin.    Marland Kitchen gabapentin (NEURONTIN) 300 MG capsule 1 PO q HS, may increase to 1 PO TID if needed 90 capsule 3  . glucose blood (PRECISION QID TEST) test strip For blood sugar monitoring 3x daily with the One Touch Verio Flex glucometer    . insulin lispro (HUMALOG) 100 UNIT/ML injection Inject 20 Units into the skin daily before supper.     Marland Kitchen LANTUS SOLOSTAR 100 UNIT/ML Solostar Pen INJECT 50 UNITS DOSE INTO THE SKIN DAILY.    Marland Kitchen lisinopril  (ZESTRIL) 40 MG tablet Take 1 tablet (40 mg total) by mouth daily. 90 tablet 2  . metoprolol succinate (TOPROL-XL) 50 MG 24 hr tablet Take 1 tablet (50 mg total) by mouth 2 (two) times daily. 180 tablet 1  . nitroGLYCERIN (NITROSTAT) 0.4 MG SL tablet Place 1 tablet (0.4 mg total) under the tongue every 5 (five) minutes x 3 doses as needed for chest pain. 25 tablet 12  . rosuvastatin (CRESTOR) 40 MG tablet TAKE 1 TABLET BY MOUTH EVERY DAY 90 tablet 3   No current facility-administered medications on file prior to visit.    Allergies  Allergen Reactions  . Amoxicillin Other (See Comments)    Yeast Infection with AMOXIL Use Give pt yeast infection Has patient had a PCN reaction causing immediate rash, facial/tongue/throat swelling, SOB or lightheadedness with hypotension: No Has patient had a PCN reaction causing severe rash involving mucus membranes or skin necrosis: No Has patient had a PCN reaction that required hospitalization No Has patient had a PCN reaction occurring within the last 10 years: No If all of the above answers are "NO", then may proceed with Cephalosporin use.  Yeast Infection with AMOXIL Use    Social History:  reports that she has never smoked. She has never used smokeless tobacco. She reports that she does not drink alcohol or use drugs.  Family History  Problem Relation Age of Onset  . Heart disease Mother   . Diabetes Mother   . Hypertension Father   . Diabetes Father   . Kidney failure Father   . Stroke Father   . Hypertension Paternal Grandmother   . Diabetes Paternal Grandmother   . Stroke Paternal Grandmother   . Diabetes Maternal Grandmother   . Ovarian cancer Maternal Grandmother   . Diabetes Paternal Grandfather     The following portions of the patient's history were reviewed and updated as appropriate: allergies, current medications, past family history, past medical history, past social history, past surgical history and problem list.  Review  of Systems Pertinent items noted in HPI and remainder of comprehensive ROS otherwise negative.  Physical Exam:  BP 127/78   Pulse 72   Temp 98.3 F (36.8 C)   Ht 5\' 6"  (1.676 m)   Wt 258 lb (117 kg)   BMI 41.64 kg/m  CONSTITUTIONAL: Well-developed, well-nourished female in no acute distress.  HENT:  Normocephalic, atraumatic, External right and left ear normal. Oropharynx is clear and moist EYES: Conjunctivae and EOM are normal. Pupils are equal, round, and reactive to light. No scleral icterus.  NECK: Normal range of motion, supple, no masses.  Normal thyroid.  SKIN: Skin is warm and dry. No rash noted. Not diaphoretic. No erythema. No pallor. MUSCULOSKELETAL: Normal range of motion. No tenderness.  No cyanosis, clubbing, or edema.  2+ distal pulses. NEUROLOGIC: Alert and  oriented to person, place, and time. Normal reflexes, muscle tone coordination.  PSYCHIATRIC: Normal mood and affect. Normal behavior. Normal judgment and thought content. CARDIOVASCULAR: Normal heart rate noted, regular rhythm RESPIRATORY: Clear to auscultation bilaterally. Effort and breath sounds normal, no problems with respiration noted. BREASTS: Symmetric in size. No masses, tenderness, skin changes, nipple drainage, or lymphadenopathy bilaterally. ABDOMEN: Soft, no distention noted.  Mild suprapubic tenderness, no rebound or guarding.  PELVIC: Normal appearing external genitalia and urethral meatus; normal appearing vaginal mucosa and cervix.  This white discharge noted, dispersed evenly throughout vault without clumping. Slight green tinge noted in some areas of vagina.  Pap smear obtained with moderate tolerance by patient.  Normal uterine size, no other palpable masses, no uterine or adnexal tenderness.   Assessment and Plan:    1. Well woman exam with routine gynecological exam - Reviewed recommendations for preventive care including weight bearing exercise, restated in AVS - PCP and Endocrinologist will  accomplish health maintenance blood work during appts next month per patient - Cytology - PAP( [place]) - Cervicovaginal ancillary only( [place]) - Urine Culture  2. Routine screening for STI (sexually transmitted infection) - Swabs only  3. Fibroids - S/p ablation 01/08/2008  Will follow up results of pap smear and manage accordingly. Mammogram scheduled for 02/25/2020 Routine preventative health maintenance measures emphasized. Please refer to After Visit Summary for other counseling recommendations.      Mallie Snooks, MSN, CNM Certified Nurse Midwife, [facility] for [employer], [place] 02/02/20 9:32 AM

## 2020-02-02 NOTE — Progress Notes (Addendum)
New GYN presents for AEX/PAP/STD.  C/o cramping 8/10 x 6 days, thinks its her Fibroids, itching, discharge x 3 days.  Mammo 02/25/2020  PHQ-9=0

## 2020-02-03 ENCOUNTER — Other Ambulatory Visit: Payer: Self-pay | Admitting: Advanced Practice Midwife

## 2020-02-03 DIAGNOSIS — B9689 Other specified bacterial agents as the cause of diseases classified elsewhere: Secondary | ICD-10-CM

## 2020-02-03 DIAGNOSIS — B379 Candidiasis, unspecified: Secondary | ICD-10-CM

## 2020-02-03 DIAGNOSIS — N760 Acute vaginitis: Secondary | ICD-10-CM

## 2020-02-03 LAB — CERVICOVAGINAL ANCILLARY ONLY
Bacterial Vaginitis (gardnerella): POSITIVE — AB
Candida Glabrata: POSITIVE — AB
Candida Vaginitis: NEGATIVE
Chlamydia: NEGATIVE
Comment: NEGATIVE
Comment: NEGATIVE
Comment: NEGATIVE
Comment: NEGATIVE
Comment: NEGATIVE
Comment: NORMAL
Neisseria Gonorrhea: NEGATIVE
Trichomonas: NEGATIVE

## 2020-02-03 MED ORDER — METRONIDAZOLE 500 MG PO TABS: 500.0000 mg | ORAL_TABLET | Freq: Two times a day (BID) | ORAL | 0 refills | Status: DC

## 2020-02-03 MED ORDER — FLUCONAZOLE 150 MG PO TABS: 150.0000 mg | ORAL_TABLET | Freq: Once | ORAL | 0 refills | Status: DC

## 2020-02-03 MED ORDER — CVS PROBIOTIC (LACTOBACILLUS) PO CAPS: 1.0000 | ORAL_CAPSULE | Freq: Every day | ORAL | 0 refills | Status: DC

## 2020-02-03 NOTE — Progress Notes (Signed)
+   yeast and BV on swab, recurrent issue for patient. Rx to pharmacy, notice via active MyChart account.  Mallie Snooks, MSN, CNM Certified Nurse Midwife, [facility] [employer] [facility] for [employer], [place] [facility] 02/03/20 1:08 PM

## 2020-02-04 ENCOUNTER — Ambulatory Visit: Payer: BC Managed Care – PPO | Admitting: Rehabilitative and Restorative Service Providers"

## 2020-02-04 LAB — URINE CULTURE

## 2020-02-10 ENCOUNTER — Encounter: Payer: Self-pay | Admitting: Advanced Practice Midwife

## 2020-02-10 DIAGNOSIS — R87619 Unspecified abnormal cytological findings in specimens from cervix uteri: Secondary | ICD-10-CM | POA: Insufficient documentation

## 2020-02-10 LAB — CYTOLOGY - PAP
Adequacy: ABSENT
Comment: NEGATIVE
Comment: NEGATIVE
Diagnosis: NEGATIVE
HPV 16: NEGATIVE
HPV 18 / 45: NEGATIVE
High risk HPV: POSITIVE — AB

## 2020-02-15 ENCOUNTER — Other Ambulatory Visit: Payer: Self-pay | Admitting: Physician Assistant

## 2020-02-15 DIAGNOSIS — I2510 Atherosclerotic heart disease of native coronary artery without angina pectoris: Secondary | ICD-10-CM

## 2020-02-17 ENCOUNTER — Ambulatory Visit: Payer: BC Managed Care – PPO | Admitting: Physical Therapy

## 2020-02-19 LAB — COMPREHENSIVE METABOLIC PANEL
ALT: 16 IU/L (ref 0–32)
AST: 16 IU/L (ref 0–40)
Albumin/Globulin Ratio: 1.2 (ref 1.2–2.2)
Albumin: 3.8 g/dL (ref 3.8–4.8)
Alkaline Phosphatase: 97 IU/L (ref 39–117)
BUN/Creatinine Ratio: 19 (ref 9–23)
BUN: 14 mg/dL (ref 6–24)
Bilirubin Total: 0.3 mg/dL (ref 0.0–1.2)
CO2: 24 mmol/L (ref 20–29)
Calcium: 9.9 mg/dL (ref 8.7–10.2)
Chloride: 101 mmol/L (ref 96–106)
Creatinine, Ser: 0.73 mg/dL (ref 0.57–1.00)
GFR calc Af Amer: 113 mL/min/{1.73_m2} (ref >59)
GFR calc non Af Amer: 98 mL/min/{1.73_m2} (ref >59)
Globulin, Total: 3.2 g/dL (ref 1.5–4.5)
Glucose: 238 mg/dL — ABNORMAL HIGH (ref 65–99)
Potassium: 4.7 mmol/L (ref 3.5–5.2)
Sodium: 139 mmol/L (ref 134–144)
Total Protein: 7 g/dL (ref 6.0–8.5)

## 2020-02-19 LAB — LIPID PANEL
Chol/HDL Ratio: 2.8 ratio (ref 0.0–4.4)
Cholesterol, Total: 145 mg/dL (ref 100–199)
HDL: 52 mg/dL (ref >39)
LDL Chol Calc (NIH): 76 mg/dL (ref 0–99)
Triglycerides: 87 mg/dL (ref 0–149)
VLDL Cholesterol Cal: 17 mg/dL (ref 5–40)

## 2020-02-23 ENCOUNTER — Telehealth: Payer: Self-pay | Admitting: *Deleted

## 2020-02-23 DIAGNOSIS — E785 Hyperlipidemia, unspecified: Secondary | ICD-10-CM

## 2020-02-23 MED ORDER — EZETIMIBE 10 MG PO TABS: 10.0000 mg | ORAL_TABLET | Freq: Every day | ORAL | 3 refills | Status: DC

## 2020-02-23 NOTE — Telephone Encounter (Signed)
-----   Message from Lelon Perla, MD sent at 02/21/2020  7:25 AM EST ----- Add zetia 10 mg daily; lipids and liver 12 weeks [facility]

## 2020-02-23 NOTE — Telephone Encounter (Signed)
pt aware of results  New script sent to the pharmacy  Lab orders mailed to the pt  

## 2020-02-25 ENCOUNTER — Ambulatory Visit: Payer: BC Managed Care – PPO

## 2020-03-14 ENCOUNTER — Other Ambulatory Visit: Payer: Self-pay

## 2020-03-14 DIAGNOSIS — B373 Candidiasis of vulva and vagina: Secondary | ICD-10-CM

## 2020-03-14 DIAGNOSIS — B3731 Acute candidiasis of vulva and vagina: Secondary | ICD-10-CM

## 2020-03-14 MED ORDER — FLUCONAZOLE 150 MG PO TABS: 150.0000 mg | ORAL_TABLET | Freq: Once | ORAL | 0 refills | Status: DC

## 2020-03-14 NOTE — Progress Notes (Signed)
Rx sent per protocol Pt made aware.

## 2020-03-18 ENCOUNTER — Other Ambulatory Visit: Payer: Self-pay | Admitting: Family Medicine

## 2020-04-25 ENCOUNTER — Other Ambulatory Visit: Payer: Self-pay

## 2020-04-25 DIAGNOSIS — B373 Candidiasis of vulva and vagina: Secondary | ICD-10-CM

## 2020-04-25 DIAGNOSIS — B3731 Acute candidiasis of vulva and vagina: Secondary | ICD-10-CM

## 2020-04-25 MED ORDER — FLUCONAZOLE 150 MG PO TABS: 150.0000 mg | ORAL_TABLET | Freq: Once | ORAL | 0 refills | Status: AC

## 2020-04-25 NOTE — Progress Notes (Signed)
Pt called with request for Dilfucan  Rx sent per protocol  Mychart message sent to make pt aware.

## 2020-04-28 ENCOUNTER — Other Ambulatory Visit: Payer: Self-pay

## 2020-04-28 ENCOUNTER — Ambulatory Visit
Admission: EM | Admit: 2020-04-28 | Discharge: 2020-04-28 | Disposition: A | Discharge status: Home / Self Care | Payer: BC Managed Care – PPO | Attending: Emergency Medicine | Admitting: Emergency Medicine

## 2020-04-28 ENCOUNTER — Encounter: Payer: Self-pay | Admitting: Emergency Medicine

## 2020-04-28 DIAGNOSIS — N760 Acute vaginitis: Secondary | ICD-10-CM | POA: Diagnosis not present

## 2020-04-28 DIAGNOSIS — B3731 Acute candidiasis of vulva and vagina: Secondary | ICD-10-CM

## 2020-04-28 DIAGNOSIS — B373 Candidiasis of vulva and vagina: Secondary | ICD-10-CM

## 2020-04-28 DIAGNOSIS — B9689 Other specified bacterial agents as the cause of diseases classified elsewhere: Secondary | ICD-10-CM

## 2020-04-28 MED ORDER — METRONIDAZOLE 500 MG PO TABS: 500.0000 mg | ORAL_TABLET | Freq: Two times a day (BID) | ORAL | 0 refills | Status: DC

## 2020-04-28 MED ORDER — FLUCONAZOLE 200 MG PO TABS: 200.0000 mg | ORAL_TABLET | Freq: Once | ORAL | 0 refills | Status: AC

## 2020-04-28 MED ORDER — CVS PROBIOTIC (LACTOBACILLUS) PO CAPS: 1.0000 | ORAL_CAPSULE | Freq: Every day | ORAL | 0 refills | Status: DC

## 2020-04-28 NOTE — ED Provider Notes (Signed)
[name]    CSN: [id number] Arrival date & time: 04/28/20  1718      History   Chief Complaint Chief Complaint  Patient presents with  . Vaginal Discharge    HPI Erica Nguyen is a 49 y.o. female with history of BV, yeast vaginitis, hypertension, obesity, diabetes presenting for vaginal irritation and pruritus since yesterday.  Patient states this feels like a yeast and BV flare: Endorsing fishy odor, thick white discharge, pruritus.  Last flare was greater than 3 months ago.  Denying pelvic or vaginal pain, abdominal back pain, fever.  No change in urination or bowel habit.  Has not tried nothing for this.  States she is to be on a probiotic which helped, though due to loss of insurance she was unable to continue feeling this.  States she recently got insurance and is interested in resuming this.   Past Medical History:  Diagnosis Date  . Abnormal Pap smear   . CAD (coronary artery disease)   . Diabetes mellitus    insulin-dependent type 2  . Family history of adverse reaction to anesthesia    sister has n/v  . Fibroid   . HNP (herniated nucleus pulposus)   . Hypertension    chronic hypertension  . Pregnancy induced hypertension     Patient Active Problem List   Diagnosis Date Noted  . Abnormal Pap smear of cervix 02/10/2020  . NSTEMI (non-ST elevated myocardial infarction) ([place]) 07/31/2017  . Acute non Q wave myocardial infarction ([place]) 07/20/2017  . Ambulatory dysfunction   . Intractable pain   . Uncontrolled type 2 diabetes mellitus without complication, with long-term current use of insulin   . Sciatica of right side   . Hyperglycemia 10/09/2016  . Lumbar herniated disc 10/09/2016  . Myelopathy ([place]) 10/09/2016  . Hypercholesterolemia 01/23/2016  . Vitamin D deficiency 01/23/2016  . Vaginal candidiasis 11/29/2015  . Spinal stenosis of lumbar region 11/22/2015  . Obesity, Class II, BMI 35-39.9, with comorbidity 10/15/2015  . Lumbosacral  radiculopathy due to osteoarthritis of spine 06/10/2015  . Uncontrolled type 2 diabetes mellitus with hyperglycemia, with long-term current use of insulin ([place]) 06/10/2015  . Fibroids, intramural 06/27/2014  . Chest pain 01/06/2012  . Dyspnea 01/06/2012  . Controlled type 2 diabetes mellitus with hyperglycemia ([place]) 09/25/2007  . Essential hypertension 09/25/2007    Past Surgical History:  Procedure Laterality Date  . CARDIAC CATHETERIZATION  02/29/2008   Normal coronary arteries -- Normal LV (left ventricular) systolic function -- Mild to moderate elevation in left ventricular end-diastolic  pressure secondary to hypertension, diabetes and obesity  . CHOLECYSTECTOMY  1995  . COLPOSCOPY    . CORONARY STENT INTERVENTION N/A 07/21/2017   Procedure: Coronary Stent Intervention;  Surgeon: [name], MD;  Location: [place] [facility];  Service: Cardiovascular;  Laterality: N/A;  . DECOMPRESSIVE LUMBAR LAMINECTOMY LEVEL 1 Right 11/22/2015   Procedure: DECOMPRESSIVE LUMBAR LAMINECTOMY L5-S1 ON RIGHT,FORAMINOTOMIES L5,S1 DISCECTOMY L5,S1;  Surgeon: Susa Day, MD;  Location: [facility];  Service: Orthopedics;  Laterality: Right;  . GYNECOLOGIC CRYOSURGERY  1998  . Hysteroscopy, D&C, Novasure ablation, removal of Intrauterine device  01/08/2008  . LEFT HEART CATH AND CORONARY ANGIOGRAPHY N/A 07/21/2017   Procedure: Left Heart Cath and Coronary Angiography;  Surgeon: [name], MD;  Location: [place] [facility];  Service: Cardiovascular;  Laterality: N/A;  . LUMBAR LAMINECTOMY/DECOMPRESSION MICRODISCECTOMY Right 10/11/2016   Procedure: REVISION MICRO LUMBAR DECOMPRESSION L5-S1 RIGHT;  Surgeon: Susa Day, MD;  Location: [facility];  Service: Orthopedics;  Laterality: Right;  . NOVASURE ABLATION  01/08/2008    OB History    Gravida  3   Para  1   Term  1   Preterm      AB  2   Living  1     SAB  2   TAB      Ectopic      Multiple      Live Births  1            Home  Medications    Prior to Admission medications   Medication Sig Start Date End Date Taking? Authorizing Provider  aspirin EC 81 MG EC tablet Take 1 tablet (81 mg total) by mouth daily. 07/24/17  Yes [name], MD  baclofen (LIORESAL) 10 MG tablet TAKE 0.5-1 TABLETS (5-10 MG TOTAL) BY MOUTH AT BEDTIME AS NEEDED FOR MUSCLE SPASMS. 03/19/20  Yes Hilts, Legrand Como, MD  Cholecalciferol (VITAMIN D-3) 125 MCG (5000 UT) TABS Take 1 tablet by mouth daily. 12/27/19  Yes Hilts, Legrand Como, MD  Dulaglutide (TRULICITY) A999333 0000000 SOPN Inject into the skin. 11/05/19  Yes [provider]  ezetimibe (ZETIA) 10 MG tablet Take 1 tablet (10 mg total) by mouth daily. 02/23/20 05/23/20 Yes Lelon Perla, MD  insulin lispro (HUMALOG) 100 UNIT/ML injection Inject 20 Units into the skin daily before supper.    Yes [provider]  LANTUS SOLOSTAR 100 UNIT/ML Solostar Pen INJECT 50 UNITS DOSE INTO THE SKIN DAILY. 03/10/19  Yes [provider]  lisinopril (ZESTRIL) 40 MG tablet Take 1 tablet (40 mg total) by mouth daily. 09/22/19  Yes Almyra Deforest, PA  metoprolol succinate (TOPROL-XL) 50 MG 24 hr tablet TAKE 1 TABLET BY MOUTH TWICE A DAY 02/15/20  Yes Eulas Post, [place], PA  rosuvastatin (CRESTOR) 40 MG tablet TAKE 1 TABLET BY MOUTH EVERY DAY 01/03/20  Yes Almyra Deforest, PA  acetaminophen (TYLENOL) 500 MG tablet Take 1,000 mg by mouth every 6 (six) hours as needed for moderate pain or headache.    [provider]  fluconazole (DIFLUCAN) 200 MG tablet Take 1 tablet (200 mg total) by mouth once for 1 dose. May repeat in 72 hours if needed 04/28/20 04/28/20  Hall-Potvin, [place], PA-C  gabapentin (NEURONTIN) 300 MG capsule 1 PO q HS, may increase to 1 PO TID if needed 12/27/19   Hilts, Michael, MD  glucose blood (PRECISION QID TEST) test strip For blood sugar monitoring 3x daily with the One Touch Verio Flex glucometer 10/22/18   [provider]  Lactobacillus Rhamnosus, GG, ([employer] PROBIOTIC, LACTOBACILLUS,) CAPS  Take 1 tablet by mouth daily. 04/28/20   Hall-Potvin, [place], PA-C  metroNIDAZOLE (FLAGYL) 500 MG tablet Take 1 tablet (500 mg total) by mouth 2 (two) times daily. 04/28/20   Hall-Potvin, [place], PA-C  nitroGLYCERIN (NITROSTAT) 0.4 MG SL tablet Place 1 tablet (0.4 mg total) under the tongue every 5 (five) minutes x 3 doses as needed for chest pain. 07/23/17   [name], MD  baclofen (LIORESAL) 10 MG tablet Take 0.5-1 tablets (5-10 mg total) by mouth at bedtime as needed for muscle spasms. 12/27/19   Hilts, Legrand Como, MD    Family History Family History  Problem Relation Age of Onset  . Heart disease Mother   . Diabetes Mother   . Hypertension Father   . Diabetes Father   . Kidney failure Father   . Stroke Father   . Hypertension Paternal Grandmother   . Diabetes Paternal Grandmother   .  Stroke Paternal Grandmother   . Diabetes Maternal Grandmother   . Ovarian cancer Maternal Grandmother   . Diabetes Paternal Grandfather     Social History Social History   Tobacco Use  . Smoking status: Never Smoker  . Smokeless tobacco: Never Used  Substance Use Topics  . Alcohol use: No  . Drug use: No     Allergies   Amoxicillin   Review of Systems As per HPI  Physical Exam Triage Vital Signs ED Triage Vitals  Enc Vitals Group     BP 04/28/20 1722 109/63     Pulse Rate 04/28/20 1722 92     Resp 04/28/20 1722 16     Temp 04/28/20 1722 97.8 F (36.6 C)     Temp src --      SpO2 04/28/20 1722 98 %     Weight --      Height --      Head Circumference --      Peak Flow --      Pain Score 04/28/20 1723 4     Pain Loc --      Pain Edu? --      Excl. in [place]? --    No data found.  Updated Vital Signs BP 109/63   Pulse 92   Temp 97.8 F (36.6 C)   Resp 16   LMP 04/16/2020   SpO2 98%   Visual Acuity Right Eye Distance:   Left Eye Distance:   Bilateral Distance:    Right Eye Near:   Left Eye Near:    Bilateral Near:     Physical Exam Constitutional:       General: She is not in acute distress. HENT:     Head: Normocephalic and atraumatic.  Eyes:     General: No scleral icterus.    Pupils: Pupils are equal, round, and reactive to light.  Cardiovascular:     Rate and Rhythm: Normal rate.  Pulmonary:     Effort: Pulmonary effort is normal.  Abdominal:     General: Bowel sounds are normal.     Palpations: Abdomen is soft.     Tenderness: There is no abdominal tenderness. There is no right CVA tenderness, left CVA tenderness or guarding.  Skin:    Coloration: Skin is not jaundiced or pale.  Neurological:     Mental Status: She is alert and oriented to person, place, and time.      UC Treatments / Results  Labs (all labs ordered are listed, but only abnormal results are displayed) Labs Reviewed - No data to display  EKG   Radiology No results found.  Procedures Procedures (including critical care time)  Medications Ordered in UC Medications - No data to display  Initial Impression / Assessment and Plan / UC Course  I have reviewed the triage vital signs and the nursing notes.  Pertinent labs & imaging results that were available during my care of the patient were reviewed by me and considered in my medical decision making (see chart for details).     Patient appears well in office today.  H&P consistent with BV and vaginal Candida.  Patient is currently sexually active, though not concerned for STD at this time.  Will defer cytology, treat empirically for BV and yeast vaginitis as outlined below.  Return precautions discussed, patient verbalized understanding and is agreeable to plan. Final Clinical Impressions(s) / UC Diagnoses   Final diagnoses:  Yeast vaginitis  BV (bacterial vaginosis)  Discharge Instructions     Take antibiotic twice daily with food. Take Diflucan with both first and last dose of antibiotic. Important to drink plenty of water throughout the day. Return for worsening urinary symptoms, blood  in urine, abdominal or back pain, fever.    ED Prescriptions    Medication Sig Dispense Auth. Provider   fluconazole (DIFLUCAN) 200 MG tablet Take 1 tablet (200 mg total) by mouth once for 1 dose. May repeat in 72 hours if needed 2 tablet Hall-Potvin, [place], PA-C   metroNIDAZOLE (FLAGYL) 500 MG tablet Take 1 tablet (500 mg total) by mouth 2 (two) times daily. 14 tablet Hall-Potvin, [place], PA-C   Lactobacillus Rhamnosus, GG, ([employer]S PROBIOTIC, LACTOBACILLUS,) CAPS Take 1 tablet by mouth daily. 30 capsule Hall-Potvin, [place], PA-C     PDMP not reviewed this encounter.   Hall-Potvin, [place], [place] 04/28/20 1735

## 2020-04-28 NOTE — Discharge Instructions (Signed)
Take antibiotic twice daily with food. Take Diflucan with both first and last dose of antibiotic. Important to drink plenty of water throughout the day. Return for worsening urinary symptoms, blood in urine, abdominal or back pain, fever.

## 2020-04-28 NOTE — ED Triage Notes (Addendum)
Pt c/o vaginal pain and vaginal discharge since yesterday. Has history of BV and yeast infections. Pt was on probiotics with improvement but ran out.

## 2020-05-02 ENCOUNTER — Encounter: Payer: Self-pay | Admitting: Advanced Practice Midwife

## 2020-05-02 ENCOUNTER — Other Ambulatory Visit: Payer: Self-pay

## 2020-05-02 ENCOUNTER — Other Ambulatory Visit (HOSPITAL_COMMUNITY)
Admission: RE | Admit: 2020-05-02 | Discharge: 2020-05-02 | Disposition: A | Discharge status: Home / Self Care | Payer: BC Managed Care – PPO | Source: Ambulatory Visit | Attending: Advanced Practice Midwife | Admitting: Advanced Practice Midwife

## 2020-05-02 ENCOUNTER — Ambulatory Visit: Payer: BC Managed Care – PPO | Admitting: Advanced Practice Midwife

## 2020-05-02 VITALS — BP 143/84 | HR 111 | Ht 66.0 in | Wt 246.0 lb

## 2020-05-02 DIAGNOSIS — R102 Pelvic and perineal pain: Secondary | ICD-10-CM | POA: Diagnosis not present

## 2020-05-02 DIAGNOSIS — N898 Other specified noninflammatory disorders of vagina: Secondary | ICD-10-CM | POA: Diagnosis not present

## 2020-05-02 DIAGNOSIS — Z3202 Encounter for pregnancy test, result negative: Secondary | ICD-10-CM | POA: Diagnosis not present

## 2020-05-02 DIAGNOSIS — N3000 Acute cystitis without hematuria: Secondary | ICD-10-CM

## 2020-05-02 LAB — POCT URINE PREGNANCY: Preg Test, Ur: NEGATIVE

## 2020-05-02 MED ORDER — TRAMADOL HCL 50 MG PO TABS: 50.0000 mg | ORAL_TABLET | Freq: Four times a day (QID) | ORAL | 0 refills | Status: DC | PRN

## 2020-05-02 NOTE — Progress Notes (Signed)
Pt states that she went to urgent care on 04-28-20 and was treated for bv and yeast, currently taking metronidazole. Pt states that since then she has been having left sided pelvic pain and pink discharge. Pt reports hx of fibroids.

## 2020-05-02 NOTE — Progress Notes (Signed)
  GYNECOLOGY PROGRESS NOTE  History:  49 y.o. [id number] presents to [place] office today for problem gyn visit. She reports symtpoms of BV and yeast resolved after she started medications from Urgent Care on 04/28/20 but on 04/29/20 she developed LLQ sharp shooting pain that has progressively worsened. She missed work yesterday and left early today because of the pain.  She has associated nausea but no vomiting.  There is no fever/chills or other symptoms.  She has tried Aleve, Tylenol, and ibuprofen without improvement.   The following portions of the patient's history were reviewed and updated as appropriate: allergies, current medications, past family history, past medical history, past social history, past surgical history and problem list. Last pap smear on 02/02/2020 was abnormal with positive HPV, negative for 16, 18, 45.   Review of Systems:  Pertinent items are noted in HPI.   Objective:  Physical Exam Blood pressure (!) 143/84, pulse (!) 111, height 5\' 6"  (1.676 m), weight 246 lb (111.6 kg), last menstrual period 04/16/2020. VS reviewed, nursing note reviewed,  Constitutional: well developed, well nourished, no distress HEENT: normocephalic CV: normal rate Pulm/chest wall: normal effort Breast Exam: deferred Abdomen: soft Neuro: alert and oriented x 3 Skin: warm, dry Psych: affect normal Pelvic exam: Cervix pink, visually closed, without lesion, scant white creamy discharge, vaginal walls and external genitalia normal Bimanual exam: Cervix 0/long/high, firm, anterior, neg CMT, uterus nontender, nonenlarged, adnexa without tenderness, enlargement, or mass  Assessment & Plan:  1. Vaginal discharge --Pt was treated for BV and yeast at Urgent Care visit 04/28/20, then started to have sharp LLQ pain and pink vaginal discharge after she started the medications. --Will retest pt and add STD testing. Pt denies risks for STDs so not likely.  --Scant pink bleeding on speculum exam -  Cervicovaginal ancillary only( [place])  2. Acute pelvic pain, female --No acute abdomen on exam but significant tenderness in left adnexa, difficult to assess adnexa due to body habitus and pain, but no abnormalities palpated --UPT negative, urine culture sent --Rx for Tramadol 50-100 mg Q 6 hours PRN x 20 tabs.  Note for pt to miss work until Monday.  [place] scheduled next week. --Reasons to seek emergency care reviewed. - US PELVIC COMPLETE WITH TRANSVAGINAL; Future - POCT urine pregnancy - Urine Culture   Fatima Blank, CNM 4:59 PM

## 2020-05-02 NOTE — Patient Instructions (Signed)
Pelvic Pain, Female Pelvic pain is pain in your lower abdomen, below your belly button and between your hips. The pain may start suddenly (be acute), keep coming back (be recurring), or last a long time (become chronic). Pelvic pain that lasts longer than 6 months is considered chronic. Pelvic pain may affect your:  Reproductive organs.  Urinary system.  Digestive tract.  Musculoskeletal system. There are many potential causes of pelvic pain. Sometimes, the pain can be a result of digestive or urinary conditions, strained muscles or ligaments, or reproductive conditions. Sometimes the cause of pelvic pain is not known. Follow these instructions at home:   Take over-the-counter and prescription medicines only as told by your health care provider.  Rest as told by your health care provider.  Do not have sex if it hurts.  Keep a journal of your pelvic pain. Write down: ? When the pain started. ? Where the pain is located. ? What seems to make the pain better or worse, such as food or your period (menstrual cycle). ? Any symptoms you have along with the pain.  Keep all follow-up visits as told by your health care provider. This is important. Contact a health care provider if:  Medicine does not help your pain.  Your pain comes back.  You have new symptoms.  You have abnormal vaginal discharge or bleeding, including bleeding after menopause.  You have a fever or chills.  You are constipated.  You have blood in your urine or stool.  You have foul-smelling urine.  You feel weak or light-headed. Get help right away if:  You have sudden severe pain.  Your pain gets steadily worse.  You have severe pain along with fever, nausea, vomiting, or excessive sweating.  You lose consciousness. Summary  Pelvic pain is pain in your lower abdomen, below your belly button and between your hips.  There are many potential causes of pelvic pain.  Keep a journal of your pelvic  pain. This information is not intended to replace advice given to you by your health care provider. Make sure you discuss any questions you have with your health care provider. Document Revised: 05/27/2018 Document Reviewed: 05/27/2018 Elsevier Patient Education  2020 [employer]  

## 2020-05-04 ENCOUNTER — Telehealth: Payer: Self-pay

## 2020-05-04 LAB — CERVICOVAGINAL ANCILLARY ONLY
Bacterial Vaginitis (gardnerella): POSITIVE — AB
Candida Glabrata: NEGATIVE
Candida Vaginitis: NEGATIVE
Chlamydia: NEGATIVE
Comment: NEGATIVE
Comment: NEGATIVE
Comment: NEGATIVE
Comment: NEGATIVE
Comment: NEGATIVE
Comment: NORMAL
Neisseria Gonorrhea: NEGATIVE
Trichomonas: NEGATIVE

## 2020-05-04 NOTE — Telephone Encounter (Signed)
Pt c/o nausea throughout the day. She is requesting Zofran. Will consult with CNM

## 2020-05-06 LAB — URINE CULTURE

## 2020-05-07 ENCOUNTER — Other Ambulatory Visit: Payer: Self-pay | Admitting: Advanced Practice Midwife

## 2020-05-07 MED ORDER — NITROFURANTOIN MONOHYD MACRO 100 MG PO CAPS: 100.0000 mg | ORAL_CAPSULE | Freq: Two times a day (BID) | ORAL | 1 refills | Status: DC

## 2020-05-07 MED ORDER — ONDANSETRON 4 MG PO TBDP
4.0000 mg | ORAL_TABLET | Freq: Four times a day (QID) | ORAL | 0 refills | Status: DC | PRN
Start: 2020-05-07 — End: 2020-08-05

## 2020-05-07 NOTE — Addendum Note (Signed)
Addended by: Fatima Blank A on: 05/07/2020 08:33 PM   Modules accepted: Orders

## 2020-05-10 ENCOUNTER — Telehealth: Payer: Self-pay

## 2020-05-10 ENCOUNTER — Telehealth: Payer: BC Managed Care – PPO | Admitting: Obstetrics and Gynecology

## 2020-05-10 ENCOUNTER — Ambulatory Visit: Admission: RE | Admit: 2020-05-10 | Payer: BC Managed Care – PPO | Source: Ambulatory Visit

## 2020-05-10 NOTE — Telephone Encounter (Signed)
Called to advise of results and rx sent, no answer, left vm

## 2020-05-15 ENCOUNTER — Other Ambulatory Visit: Payer: Self-pay | Admitting: *Deleted

## 2020-05-15 MED ORDER — FLUCONAZOLE 150 MG PO TABS
150.0000 mg | ORAL_TABLET | Freq: Once | ORAL | 0 refills | Status: AC
Start: 2020-05-15 — End: 2020-05-15

## 2020-05-15 NOTE — Progress Notes (Signed)
See lab result note.

## 2020-06-23 ENCOUNTER — Other Ambulatory Visit: Payer: Self-pay

## 2020-06-23 ENCOUNTER — Telehealth: Payer: Self-pay

## 2020-06-23 DIAGNOSIS — N898 Other specified noninflammatory disorders of vagina: Secondary | ICD-10-CM

## 2020-06-23 MED ORDER — METRONIDAZOLE 500 MG PO TABS: 500.0000 mg | ORAL_TABLET | Freq: Two times a day (BID) | ORAL | 0 refills | Status: DC

## 2020-06-23 MED ORDER — FLUCONAZOLE 150 MG PO TABS: 150.0000 mg | ORAL_TABLET | Freq: Once | ORAL | 0 refills | Status: AC

## 2020-06-23 NOTE — Telephone Encounter (Signed)
Rx sent for BV and yeast per protocol .

## 2020-06-30 ENCOUNTER — Other Ambulatory Visit: Payer: Self-pay

## 2020-06-30 ENCOUNTER — Ambulatory Visit
Admission: RE | Admit: 2020-06-30 | Discharge: 2020-06-30 | Disposition: A | Discharge status: Home / Self Care | Payer: BC Managed Care – PPO | Source: Ambulatory Visit | Attending: Family Medicine | Admitting: Family Medicine

## 2020-06-30 DIAGNOSIS — Z1231 Encounter for screening mammogram for malignant neoplasm of breast: Secondary | ICD-10-CM

## 2020-08-05 ENCOUNTER — Encounter: Payer: Self-pay | Admitting: *Deleted

## 2020-08-05 ENCOUNTER — Ambulatory Visit
Admission: EM | Admit: 2020-08-05 | Discharge: 2020-08-05 | Disposition: A | Discharge status: Home / Self Care | Payer: BC Managed Care – PPO | Attending: Emergency Medicine | Admitting: Emergency Medicine

## 2020-08-05 ENCOUNTER — Other Ambulatory Visit: Payer: Self-pay

## 2020-08-05 DIAGNOSIS — B3731 Acute candidiasis of vulva and vagina: Secondary | ICD-10-CM

## 2020-08-05 DIAGNOSIS — B373 Candidiasis of vulva and vagina: Secondary | ICD-10-CM

## 2020-08-05 MED ORDER — MICONAZOLE NITRATE 2 % EX POWD
CUTANEOUS | 0 refills | Status: DC | PRN
Start: 2020-08-05 — End: 2021-05-18

## 2020-08-05 MED ORDER — FLUCONAZOLE 150 MG PO TABS
150.0000 mg | ORAL_TABLET | Freq: Every day | ORAL | 0 refills | Status: DC
Start: 2020-08-05 — End: 2020-12-28

## 2020-08-05 NOTE — Discharge Instructions (Addendum)
Take diflucan as directed. Follow up with PCP in 1 week if persistent symptoms. Return here for worsening symptoms, or you develop blood in urine, abdominal or back pain, fever.

## 2020-08-05 NOTE — ED Provider Notes (Signed)
[name]    CSN: [id number] Arrival date & time: 08/05/20  1029      History   Chief Complaint Chief Complaint  Patient presents with  . Vaginal Itching    HPI Erica Nguyen is a 49 y.o. female presenting for vaginal irritation, itching for the last few days.  Has tried OTC Monistat without relief.  States that Diflucan typically helps.  Did complete antibiotic course a few weeks ago for BV.  Denying vaginal discharge, injury, pelvic or abdominal pain, back pain, fever.  No vaginal bleeding.  No concern for pregnancy.  Denies UTI sx.    Past Medical History:  Diagnosis Date  . Abnormal Pap smear   . Diabetes mellitus    insulin-dependent type 2  . Family history of adverse reaction to anesthesia    sister has n/v  . Fibroid   . HNP (herniated nucleus pulposus)   . Hypertension    chronic hypertension  . Pregnancy induced hypertension     Patient Active Problem List   Diagnosis Date Noted  . Abnormal Pap smear of cervix 02/10/2020  . NSTEMI (non-ST elevated myocardial infarction) ([place]) 07/31/2017  . Acute non Q wave myocardial infarction ([place]) 07/20/2017  . Ambulatory dysfunction   . Intractable pain   . Uncontrolled type 2 diabetes mellitus without complication, with long-term current use of insulin   . Sciatica of right side   . Hyperglycemia 10/09/2016  . Lumbar herniated disc 10/09/2016  . Myelopathy ([place]) 10/09/2016  . Hypercholesterolemia 01/23/2016  . Vitamin D deficiency 01/23/2016  . Vaginal candidiasis 11/29/2015  . Spinal stenosis of lumbar region 11/22/2015  . Obesity, Class II, BMI 35-39.9, with comorbidity 10/15/2015  . Lumbosacral radiculopathy due to osteoarthritis of spine 06/10/2015  . Uncontrolled type 2 diabetes mellitus with hyperglycemia, with long-term current use of insulin ([place]) 06/10/2015  . Fibroids, intramural 06/27/2014  . Chest pain 01/06/2012  . Dyspnea 01/06/2012  . Controlled type 2 diabetes mellitus with  hyperglycemia ([place]) 09/25/2007  . Essential hypertension 09/25/2007    Past Surgical History:  Procedure Laterality Date  . BACK SURGERY    . CARDIAC CATHETERIZATION  02/29/2008   Normal coronary arteries -- Normal LV (left ventricular) systolic function -- Mild to moderate elevation in left ventricular end-diastolic  pressure secondary to hypertension, diabetes and obesity  . CHOLECYSTECTOMY  1995  . COLPOSCOPY    . CORONARY STENT INTERVENTION N/A 07/21/2017   Procedure: Coronary Stent Intervention;  Surgeon: [name], MD;  Location: [facility];  Service: Cardiovascular;  Laterality: N/A;  . DECOMPRESSIVE LUMBAR LAMINECTOMY LEVEL 1 Right 11/22/2015   Procedure: DECOMPRESSIVE LUMBAR LAMINECTOMY L5-S1 ON RIGHT,FORAMINOTOMIES L5,S1 DISCECTOMY L5,S1;  Surgeon: Susa Day, MD;  Location: [facility];  Service: Orthopedics;  Laterality: Right;  . GYNECOLOGIC CRYOSURGERY  1998  . Hysteroscopy, D&C, Novasure ablation, removal of Intrauterine device  01/08/2008  . LEFT HEART CATH AND CORONARY ANGIOGRAPHY N/A 07/21/2017   Procedure: Left Heart Cath and Coronary Angiography;  Surgeon: [name], MD;  Location: [facility];  Service: Cardiovascular;  Laterality: N/A;  . LUMBAR LAMINECTOMY/DECOMPRESSION MICRODISCECTOMY Right 10/11/2016   Procedure: REVISION MICRO LUMBAR DECOMPRESSION L5-S1 RIGHT;  Surgeon: Susa Day, MD;  Location: [facility];  Service: Orthopedics;  Laterality: Right;  . NOVASURE ABLATION  01/08/2008    OB History    Gravida  3   Para  1   Term  1   Preterm      AB  2  Living  1     SAB  2   TAB      Ectopic      Multiple      Live Births  1            Home Medications    Prior to Admission medications   Medication Sig Start Date End Date Taking? Authorizing Provider  aspirin EC 81 MG EC tablet Take 1 tablet (81 mg total) by mouth daily. 07/24/17  Yes [name], MD  baclofen (LIORESAL) 10 MG tablet TAKE 0.5-1 TABLETS (5-10 MG  TOTAL) BY MOUTH AT BEDTIME AS NEEDED FOR MUSCLE SPASMS. 03/19/20  Yes Hilts, Legrand Como, MD  Cholecalciferol (VITAMIN D-3) 125 MCG (5000 UT) TABS Take 1 tablet by mouth daily. 12/27/19  Yes Hilts, Legrand Como, MD  Dulaglutide (TRULICITY) 8.18 HU/3.1SH SOPN Inject into the skin. 11/05/19  Yes [provider]  ezetimibe (ZETIA) 10 MG tablet Take 1 tablet (10 mg total) by mouth daily. 02/23/20 08/05/20 Yes Lelon Perla, MD  insulin lispro (HUMALOG) 100 UNIT/ML injection Inject 20 Units into the skin daily before supper.    Yes [provider]  Lactobacillus Rhamnosus, GG, ([employer] PROBIOTIC, LACTOBACILLUS,) CAPS Take 1 tablet by mouth daily. 04/28/20  Yes Hall-Potvin, [place], PA-C  LANTUS SOLOSTAR 100 UNIT/ML Solostar Pen INJECT 50 UNITS DOSE INTO THE SKIN DAILY. 03/10/19  Yes [provider]  lisinopril (ZESTRIL) 40 MG tablet Take 1 tablet (40 mg total) by mouth daily. 09/22/19  Yes Almyra Deforest, PA  metoprolol succinate (TOPROL-XL) 50 MG 24 hr tablet TAKE 1 TABLET BY MOUTH TWICE A DAY 02/15/20  Yes Eulas Post, [place], PA  rosuvastatin (CRESTOR) 40 MG tablet TAKE 1 TABLET BY MOUTH EVERY DAY 01/03/20  Yes Almyra Deforest, PA  acetaminophen (TYLENOL) 500 MG tablet Take 1,000 mg by mouth every 6 (six) hours as needed for moderate pain or headache.    [provider]  fluconazole (DIFLUCAN) 150 MG tablet Take 1 tablet (150 mg total) by mouth daily. May repeat in 72 hours if needed 08/05/20   Hall-Potvin, [place], PA-C  glucose blood (PRECISION QID TEST) test strip For blood sugar monitoring 3x daily with the One Touch Verio Flex glucometer 10/22/18   [provider]  miconazole (MICOTIN) 2 % powder Apply topically as needed for itching. 08/05/20   Hall-Potvin, [place], PA-C  nitroGLYCERIN (NITROSTAT) 0.4 MG SL tablet Place 1 tablet (0.4 mg total) under the tongue every 5 (five) minutes x 3 doses as needed for chest pain. 07/23/17   [name], MD  baclofen (LIORESAL) 10 MG tablet Take 0.5-1  tablets (5-10 mg total) by mouth at bedtime as needed for muscle spasms. 12/27/19   Hilts, Legrand Como, MD  gabapentin (NEURONTIN) 300 MG capsule 1 PO q HS, may increase to 1 PO TID if needed 12/27/19 08/05/20  Hilts, Legrand Como, MD    Family History Family History  Problem Relation Age of Onset  . Heart disease Mother   . Diabetes Mother   . Hypertension Father   . Diabetes Father   . Kidney failure Father   . Stroke Father   . Hypertension Paternal Grandmother   . Diabetes Paternal Grandmother   . Stroke Paternal Grandmother   . Diabetes Maternal Grandmother   . Ovarian cancer Maternal Grandmother   . Diabetes Paternal Grandfather     Social History Social History   Tobacco Use  . Smoking status: Never Smoker  . Smokeless tobacco: Never Used  Vaping Use  . Vaping Use:  Never used  Substance Use Topics  . Alcohol use: No  . Drug use: No     Allergies   Amoxicillin   Review of Systems As per HPI   Physical Exam Triage Vital Signs ED Triage Vitals  Enc Vitals Group     BP      Pulse      Resp      Temp      Temp src      SpO2      Weight      Height      Head Circumference      Peak Flow      Pain Score      Pain Loc      Pain Edu?      Excl. in [place]?    No data found.  Updated Vital Signs BP 127/83 (BP Location: Left Arm)   Pulse 80   Temp 98.4 F (36.9 C) (Oral)   Resp 16   LMP 07/23/2020 (Approximate)   SpO2 100%   Visual Acuity Right Eye Distance:   Left Eye Distance:   Bilateral Distance:    Right Eye Near:   Left Eye Near:    Bilateral Near:     Physical Exam Constitutional:      General: She is not in acute distress. HENT:     Head: Normocephalic and atraumatic.  Eyes:     General: No scleral icterus.    Pupils: Pupils are equal, round, and reactive to light.  Cardiovascular:     Rate and Rhythm: Normal rate.  Pulmonary:     Effort: Pulmonary effort is normal.  Abdominal:     General: Bowel sounds are normal.     Palpations:  Abdomen is soft.     Tenderness: There is no abdominal tenderness. There is no right CVA tenderness, left CVA tenderness or guarding.  Skin:    Coloration: Skin is not jaundiced or pale.  Neurological:     Mental Status: She is alert and oriented to person, place, and time.      UC Treatments / Results  Labs (all labs ordered are listed, but only abnormal results are displayed) Labs Reviewed - No data to display  EKG   Radiology No results found.  Procedures Procedures (including critical care time)  Medications Ordered in UC Medications - No data to display  Initial Impression / Assessment and Plan / UC Course  I have reviewed the triage vital signs and the nursing notes.  Pertinent labs & imaging results that were available during my care of the patient were reviewed by me and considered in my medical decision making (see chart for details).     H&P consistent with yeast vaginitis: We will start Diflucan, follow-up with PCP for further treatment if needed.  Return precautions discussed, pt verbalized understanding and is agreeable to plan. Final Clinical Impressions(s) / UC Diagnoses   Final diagnoses:  Yeast vaginitis     Discharge Instructions     Take diflucan as directed. Follow up with PCP in 1 week if persistent symptoms. Return here for worsening symptoms, or you develop blood in urine, abdominal or back pain, fever.    ED Prescriptions    Medication Sig Dispense Auth. Provider   fluconazole (DIFLUCAN) 150 MG tablet Take 1 tablet (150 mg total) by mouth daily. May repeat in 72 hours if needed 2 tablet Hall-Potvin, [place], PA-C   miconazole (MICOTIN) 2 % powder Apply topically as needed for itching.  70 g Hall-Potvin, [place], PA-C     PDMP not reviewed this encounter.   Hall-Potvin, [place], [place] 08/05/20 1106

## 2020-08-05 NOTE — ED Triage Notes (Signed)
Reports starting with vaginal irritation and itching last night; has been applying OTC yeast cream without relief of itching.  Denies any vaginal discharge.

## 2020-09-19 ENCOUNTER — Telehealth: Payer: BC Managed Care – PPO | Admitting: Cardiology

## 2020-09-19 NOTE — Progress Notes (Signed)
Cardiology Clinic Note   Patient Name: Erica Nguyen Date of Encounter: 09/20/2020  Primary Care Provider:  Elisabeth Cara, [place] Primary Cardiologist:  Kirk Ruths, MD  Patient Profile    Erica Nguyen 49 year old female presents to the clinic today for follow-up evaluation of her coronary artery disease.  Past Medical History    Past Medical History:  Diagnosis Date  . Abnormal Pap smear   . Diabetes mellitus    insulin-dependent type 2  . Family history of adverse reaction to anesthesia    sister has n/v  . Fibroid   . HNP (herniated nucleus pulposus)   . Hypertension    chronic hypertension  . Pregnancy induced hypertension    Past Surgical History:  Procedure Laterality Date  . BACK SURGERY    . CARDIAC CATHETERIZATION  02/29/2008   Normal coronary arteries -- Normal LV (left ventricular) systolic function -- Mild to moderate elevation in left ventricular end-diastolic  pressure secondary to hypertension, diabetes and obesity  . CHOLECYSTECTOMY  1995  . COLPOSCOPY    . CORONARY STENT INTERVENTION N/A 07/21/2017   Procedure: Coronary Stent Intervention;  Surgeon: [name], MD;  Location: [facility];  Service: Cardiovascular;  Laterality: N/A;  . DECOMPRESSIVE LUMBAR LAMINECTOMY LEVEL 1 Right 11/22/2015   Procedure: DECOMPRESSIVE LUMBAR LAMINECTOMY L5-S1 ON RIGHT,FORAMINOTOMIES L5,S1 DISCECTOMY L5,S1;  Surgeon: Susa Day, MD;  Location: [facility];  Service: Orthopedics;  Laterality: Right;  . GYNECOLOGIC CRYOSURGERY  1998  . Hysteroscopy, D&C, Novasure ablation, removal of Intrauterine device  01/08/2008  . LEFT HEART CATH AND CORONARY ANGIOGRAPHY N/A 07/21/2017   Procedure: Left Heart Cath and Coronary Angiography;  Surgeon: [name], MD;  Location: [facility];  Service: Cardiovascular;  Laterality: N/A;  . LUMBAR LAMINECTOMY/DECOMPRESSION MICRODISCECTOMY Right 10/11/2016   Procedure: REVISION MICRO LUMBAR DECOMPRESSION L5-S1  RIGHT;  Surgeon: Susa Day, MD;  Location: [facility];  Service: Orthopedics;  Laterality: Right;  . NOVASURE ABLATION  01/08/2008    Allergies  Allergies  Allergen Reactions  . Amoxicillin Other (See Comments)    Yeast Infection with AMOXIL Use Give pt yeast infection Has patient had a PCN reaction causing immediate rash, facial/tongue/throat swelling, SOB or lightheadedness with hypotension: No Has patient had a PCN reaction causing severe rash involving mucus membranes or skin necrosis: No Has patient had a PCN reaction that required hospitalization No Has patient had a PCN reaction occurring within the last 10 years: No If all of the above answers are "NO", then may proceed with Cephalosporin use.  Yeast Infection with AMOXIL Use    History of Present Illness    Ms. Chenard was admitted with NSTEMI 7/18.  Her troponins peaked at 0.79.  She was cared for by Dr. Terrence Dupont.  A cardiac catheterization showed an LVEF of 35-45%, 75% RCA, 75% distal LAD.  She received PCI with DES x2 to her RCA and LAD.  Her echocardiogram 12/18 showed normal LV function, G2 DD.  A nuclear stress test 1/20 showed ejection fraction of 54%, breast attenuation but no ischemia.  She was last seen by Dr. Stanford Breed on 02/01/2020.  During that time she noted some dyspnea with moderate physical activity.  She had occasional brief pain in her chest 2 weeks prior to her visit that lasted 1-2 seconds.  She denied exertional chest pain and syncope.  She presents the clinic today for follow-up evaluation states she feels well. She continues to notice brief intermittent episodes of chest discomfort. She  states that these episodes last 3 to 4 minutes and go away with rest. She climbs three flights of stairs daily for work and denies chest pain with this activity. She states that she does use some salt on her food. She does notice some lower extremity swelling that accumulates through the day and is gone when she wakes up in the  morning. I will give her the [place] support stockings sheet, salty six diet sheet, have her increase her physical activity as tolerated, order a fasting lipid panel and have her follow-up in 1 year.  Today she denies chest pain, shortness of breath, lower extremity edema, fatigue, palpitations, melena, hematuria, hemoptysis, diaphoresis, weakness, presyncope, syncope, orthopnea, and PND.   Home Medications    Prior to Admission medications   Medication Sig Start Date End Date Taking? Authorizing Provider  acetaminophen (TYLENOL) 500 MG tablet Take 1,000 mg by mouth every 6 (six) hours as needed for moderate pain or headache.    [provider]  aspirin EC 81 MG EC tablet Take 1 tablet (81 mg total) by mouth daily. 07/24/17   [name], MD  baclofen (LIORESAL) 10 MG tablet TAKE 0.5-1 TABLETS (5-10 MG TOTAL) BY MOUTH AT BEDTIME AS NEEDED FOR MUSCLE SPASMS. 03/19/20   Hilts, Legrand Como, MD  Cholecalciferol (VITAMIN D-3) 125 MCG (5000 UT) TABS Take 1 tablet by mouth daily. 12/27/19   Hilts, Legrand Como, MD  Dulaglutide (TRULICITY) 3.26 ZT/2.4PY SOPN Inject into the skin. 11/05/19   [provider]  ezetimibe (ZETIA) 10 MG tablet Take 1 tablet (10 mg total) by mouth daily. 02/23/20 08/05/20  Lelon Perla, MD  fluconazole (DIFLUCAN) 150 MG tablet Take 1 tablet (150 mg total) by mouth daily. May repeat in 72 hours if needed 08/05/20   Hall-Potvin, [place], PA-C  glucose blood (PRECISION QID TEST) test strip For blood sugar monitoring 3x daily with the One Touch Verio Flex glucometer 10/22/18   [provider]  insulin lispro (HUMALOG) 100 UNIT/ML injection Inject 20 Units into the skin daily before supper.     [provider]  Lactobacillus Rhamnosus, GG, ([employer] PROBIOTIC, LACTOBACILLUS,) CAPS Take 1 tablet by mouth daily. 04/28/20   Hall-Potvin, [place], PA-C  LANTUS SOLOSTAR 100 UNIT/ML Solostar Pen INJECT 50 UNITS DOSE INTO THE SKIN DAILY. 03/10/19   [provider]  lisinopril (ZESTRIL) 40 MG tablet Take 1 tablet (40 mg total) by mouth daily. 09/22/19   Almyra Deforest, PA  metoprolol succinate (TOPROL-XL) 50 MG 24 hr tablet TAKE 1 TABLET BY MOUTH TWICE A DAY 02/15/20   Almyra Deforest, PA  miconazole (MICOTIN) 2 % powder Apply topically as needed for itching. 08/05/20   Hall-Potvin, [place], PA-C  nitroGLYCERIN (NITROSTAT) 0.4 MG SL tablet Place 1 tablet (0.4 mg total) under the tongue every 5 (five) minutes x 3 doses as needed for chest pain. 07/23/17   [name], MD  rosuvastatin (CRESTOR) 40 MG tablet TAKE 1 TABLET BY MOUTH EVERY DAY 01/03/20   Almyra Deforest, PA  baclofen (LIORESAL) 10 MG tablet Take 0.5-1 tablets (5-10 mg total) by mouth at bedtime as needed for muscle spasms. 12/27/19   Hilts, Legrand Como, MD  gabapentin (NEURONTIN) 300 MG capsule 1 PO q HS, may increase to 1 PO TID if needed 12/27/19 08/05/20  Hilts, Legrand Como, MD    Family History    Family History  Problem Relation Age of Onset  . Heart disease Mother   . Diabetes Mother   . Hypertension Father   . Diabetes  Father   . Kidney failure Father   . Stroke Father   . Hypertension Paternal Grandmother   . Diabetes Paternal Grandmother   . Stroke Paternal Grandmother   . Diabetes Maternal Grandmother   . Ovarian cancer Maternal Grandmother   . Diabetes Paternal Grandfather    She indicated that her mother is deceased. She indicated that her father is deceased. She indicated that her sister is alive. She indicated that her maternal grandmother is deceased. She indicated that her maternal grandfather is deceased. She indicated that her paternal grandmother is deceased. She indicated that her paternal grandfather is deceased.  Social History    Social History   Socioeconomic History  . Marital status: Single    Spouse name: Not on file  . Number of children: 1  . Years of education: Not on file  . Highest education level: Not on file  Occupational History  . Occupation: [employer], consierge  Tobacco  Use  . Smoking status: Never Smoker  . Smokeless tobacco: Never Used  Vaping Use  . Vaping Use: Never used  Substance and Sexual Activity  . Alcohol use: No  . Drug use: No  . Sexual activity: Not Currently    Birth control/protection: Surgical    Comment: ablation  Other Topics Concern  . Not on file  Social History Narrative  . Not on file   Social Determinants of Health   Financial Resource Strain:   . Difficulty of Paying Living Expenses: Not on file  Food Insecurity:   . Worried About Charity fundraiser in the Last Year: Not on file  . Ran Out of Food in the Last Year: Not on file  Transportation Needs:   . Lack of Transportation (Medical): Not on file  . Lack of Transportation (Non-Medical): Not on file  Physical Activity:   . Days of Exercise per Week: Not on file  . Minutes of Exercise per Session: Not on file  Stress:   . Feeling of Stress : Not on file  Social Connections:   . Frequency of Communication with Friends and Family: Not on file  . Frequency of Social Gatherings with Friends and Family: Not on file  . Attends Religious Services: Not on file  . Active Member of Clubs or Organizations: Not on file  . Attends Archivist Meetings: Not on file  . Marital Status: Not on file  Intimate Partner Violence:   . Fear of Current or Ex-Partner: Not on file  . Emotionally Abused: Not on file  . Physically Abused: Not on file  . Sexually Abused: Not on file     Review of Systems    General:  No chills, fever, night sweats or weight changes.  Cardiovascular:  No chest pain, dyspnea on exertion, generalized nonpitting bilateral lower extremity edema, orthopnea, palpitations, paroxysmal nocturnal dyspnea. Dermatological: No rash, lesions/masses Respiratory: No cough, dyspnea Urologic: No hematuria, dysuria Abdominal:   No nausea, vomiting, diarrhea, bright red blood per rectum, melena, or hematemesis Neurologic:  No visual changes, wkns, changes in  mental status. All other systems reviewed and are otherwise negative except as noted above.  Physical Exam    VS:  BP 122/80   Pulse 86   Ht 5\' 5"  (1.651 m)   Wt 261 lb (118.4 kg)   SpO2 98%   BMI 43.43 kg/m  , BMI Body mass index is 43.43 kg/m. GEN: Well nourished, well developed, in no acute distress. HEENT: normal. Neck: Supple, no JVD, carotid  bruits, or masses. Cardiac: RRR, no murmurs, rubs, or gallops. No clubbing, cyanosis, edema.  Radials/DP/PT 2+ and equal bilaterally.  Respiratory:  Respirations regular and unlabored, clear to auscultation bilaterally. GI: Soft, nontender, nondistended, BS + x 4. MS: no deformity or atrophy. Skin: warm and dry, no rash. Neuro:  Strength and sensation are intact. Psych: Normal affect.  Accessory Clinical Findings    Recent Labs: 02/18/2020: ALT 16; BUN 14; Creatinine, Ser 0.73; Potassium 4.7; Sodium 139   Recent Lipid Panel    Component Value Date/Time   CHOL 145 02/18/2020 1316   TRIG 87 02/18/2020 1316   HDL 52 02/18/2020 1316   CHOLHDL 2.8 02/18/2020 1316   LDLCALC 76 02/18/2020 1316    ECG personally reviewed by me today-none today.  EKG 02/01/2020 Sinus rhythm no ST or T wave deviation 69 bpm  Echocardiogram 12/04/2017 Study Conclusions   - Left ventricle: The cavity size was normal. Wall thickness was  normal. Systolic function was normal. The estimated ejection  fraction was in the range of 50% to 55%. Wall motion was normal;  there were no regional wall motion abnormalities. Features are  consistent with a pseudonormal left ventricular filling pattern,  with concomitant abnormal relaxation and increased filling  pressure (grade 2 diastolic dysfunction).  - Pulmonary arteries: Systolic pressure was mildly increased. PA  peak pressure: 35 mm Hg (S).     Assessment & Plan   1.  Coronary artery disease-no chest pain today. Last episode of chest discomfort was a week and a half ago lasted for 3-4  minutes and dissipated with rest. Daily walks three flights of stairs at work without chest discomfort.  Underwent LHC with DES x2 to her RCA and LAD 7/18. Continue aspirin, Zetia, insulin, lisinopril, metoprolol, nitroglycerin, rosuvastatin Heart healthy low-sodium diet-salty 6 given Increase physical activity as tolerated  Ischemic cardiomyopathy-no increased DOE or activity intolerance.  Echocardiogram 12/18 showed normal LV function and G2 DD. Continue metoprolol Heart healthy low-sodium diet-salty 6 given Increase physical activity as tolerated Lower extremity support stockings-[place] support stocking sheet given  Essential hypertension-BP today 122/80.  Well-controlled at home Continue lisinopril, metoprolol Heart healthy low-sodium diet-salty 6 given Increase physical activity as tolerated  Hyperlipidemia-02/18/2020: Cholesterol, Total 145; HDL 52; LDL Chol Calc (NIH) 76; Triglycerides 87 Continue rosuvastatin, ezetimibe Heart healthy low-sodium diet-salty 6 given Increase physical activity as tolerated Repeat fasting lipid panel  Disposition: Follow-up with Dr. Stanford Breed or me in 1 year.  Jossie Ng. Warden Buffa NP-C    09/20/2020, 4:16 PM [facility] [address] Office [phone] Fax [phone]  Notice: This dictation was prepared with Dragon dictation along with smaller phrase technology. Any transcriptional errors that result from this process are unintentional and may not be corrected upon review.

## 2020-09-20 ENCOUNTER — Ambulatory Visit (INDEPENDENT_AMBULATORY_CARE_PROVIDER_SITE_OTHER): Payer: BC Managed Care – PPO | Admitting: General Practice

## 2020-09-20 ENCOUNTER — Other Ambulatory Visit: Payer: Self-pay

## 2020-09-20 ENCOUNTER — Encounter: Payer: Self-pay | Admitting: General Practice

## 2020-09-20 VITALS — BP 122/80 | HR 86 | Ht 65.0 in | Wt 261.0 lb

## 2020-09-20 DIAGNOSIS — I2510 Atherosclerotic heart disease of native coronary artery without angina pectoris: Secondary | ICD-10-CM

## 2020-09-20 DIAGNOSIS — I10 Essential (primary) hypertension: Secondary | ICD-10-CM | POA: Diagnosis not present

## 2020-09-20 DIAGNOSIS — I255 Ischemic cardiomyopathy: Secondary | ICD-10-CM

## 2020-09-20 DIAGNOSIS — Z79899 Other long term (current) drug therapy: Secondary | ICD-10-CM

## 2020-09-20 DIAGNOSIS — E785 Hyperlipidemia, unspecified: Secondary | ICD-10-CM | POA: Diagnosis not present

## 2020-09-20 NOTE — Patient Instructions (Addendum)
Medication Instructions:  The current medical regimen is effective;  continue present plan and medications as directed. Please refer to the Current Medication list given to you today. *If you need a refill on your cardiac medications before your next appointment, please call your pharmacy*  Lab Work: [place] If you have labs (blood work) drawn today and your tests are completely normal, you will receive your results only by:  [place] (if you have MyChart) OR A paper copy in the mail.  If you have any lab test that is abnormal or we need to change your treatment, we will call you to review the results. You may go to any [facility] that is convenient for you however, we do have a lab in our office that is able to assist you. You DO NOT need an appointment for our lab. The lab is open 8:00am and closes at 4:00pm. Lunch 12:45 - 1:45pm.  Testing/Procedures: NONE  Special Instructions PLEASE PURCHASE AND WEAR COMPRESSION STOCKINGS DAILY AND OFF AT BEDTIME. Compression stockings are elastic socks that squeeze the legs. They help to increase blood flow to the legs and to decrease swelling in the legs from fluid retention, and reduce the chance of developing blood clots in the lower legs. Please put on in the AM when dressing and off at night when dressing for bed. PLEASE MAKE SURE TO ELEVATE YOUR FEET & LEGS WHILE SITTING, THIS WILL HELP WITH THE SWELLING ALSO.  PLEASE READ AND FOLLOW SALTY 6-ATTACHED  PLEASE INCREASE PHYSICAL ACTIVITY AS TOLERATED  Follow-Up: Your next appointment:  12 month(s) In Person with Kirk Ruths, MD -[place], FNP-C   At [facility], you and your health needs are our priority.  As part of our continuing mission to provide you with exceptional heart care, we have created designated Provider Care Teams.  These Care Teams include your primary Cardiologist (physician) and Advanced Practice Providers (APPs -  Physician Assistants and Nurse  Practitioners) who all work together to provide you with the care you need, when you need it.

## 2020-09-21 ENCOUNTER — Other Ambulatory Visit: Payer: Self-pay

## 2020-09-21 MED ORDER — FLUCONAZOLE 150 MG PO TABS
150.0000 mg | ORAL_TABLET | Freq: Once | ORAL | 0 refills | Status: AC
Start: 2020-09-21 — End: 2020-09-21

## 2020-09-21 NOTE — Progress Notes (Signed)
Pt requests Diflucan Reports recurrent yeast infections d/t diabetes  Diflucan sent per protocol

## 2020-10-12 LAB — LIPID PANEL
Chol/HDL Ratio: 2.7 ratio (ref 0.0–4.4)
Cholesterol, Total: 136 mg/dL (ref 100–199)
HDL: 50 mg/dL (ref >39)
LDL Chol Calc (NIH): 68 mg/dL (ref 0–99)
Triglycerides: 95 mg/dL (ref 0–149)
VLDL Cholesterol Cal: 18 mg/dL (ref 5–40)

## 2020-10-13 ENCOUNTER — Other Ambulatory Visit: Payer: Self-pay | Admitting: Physician Assistant

## 2020-11-13 ENCOUNTER — Other Ambulatory Visit: Payer: Self-pay

## 2020-11-13 DIAGNOSIS — B373 Candidiasis of vulva and vagina: Secondary | ICD-10-CM

## 2020-11-13 DIAGNOSIS — B3731 Acute candidiasis of vulva and vagina: Secondary | ICD-10-CM

## 2020-11-13 MED ORDER — FLUCONAZOLE 150 MG PO TABS: 150.0000 mg | ORAL_TABLET | Freq: Once | ORAL | 3 refills | Status: AC

## 2020-11-13 NOTE — Progress Notes (Signed)
Rx sent per protocol fpr recurrent yeast infections.

## 2020-12-12 ENCOUNTER — Other Ambulatory Visit: Payer: Self-pay | Admitting: Physician Assistant

## 2020-12-12 DIAGNOSIS — E785 Hyperlipidemia, unspecified: Secondary | ICD-10-CM

## 2020-12-28 ENCOUNTER — Encounter: Payer: Self-pay | Admitting: Obstetrics and Gynecology

## 2020-12-28 ENCOUNTER — Telehealth (INDEPENDENT_AMBULATORY_CARE_PROVIDER_SITE_OTHER): Payer: No Typology Code available for payment source | Admitting: Obstetrics and Gynecology

## 2020-12-28 ENCOUNTER — Ambulatory Visit: Payer: BC Managed Care – PPO | Admitting: Cardiology

## 2020-12-28 DIAGNOSIS — N939 Abnormal uterine and vaginal bleeding, unspecified: Secondary | ICD-10-CM | POA: Diagnosis not present

## 2020-12-28 DIAGNOSIS — D251 Intramural leiomyoma of uterus: Secondary | ICD-10-CM | POA: Diagnosis not present

## 2020-12-28 MED ORDER — FLUCONAZOLE 150 MG PO TABS
150.0000 mg | ORAL_TABLET | Freq: Every day | ORAL | 1 refills | Status: DC
Start: 2020-12-28 — End: 2021-04-27

## 2020-12-28 MED ORDER — NAPROXEN 250 MG PO TABS: 500.0000 mg | ORAL_TABLET | Freq: Two times a day (BID) | ORAL | 1 refills | Status: DC

## 2020-12-28 MED ORDER — MEGESTROL ACETATE 20 MG PO TABS
60.0000 mg | ORAL_TABLET | Freq: Two times a day (BID) | ORAL | 1 refills | Status: DC
Start: 2020-12-28 — End: 2021-07-09

## 2020-12-28 MED ORDER — IRON 325 (65 FE) MG PO TABS: 1.0000 | ORAL_TABLET | Freq: Every day | ORAL | 1 refills | Status: DC

## 2020-12-28 NOTE — Progress Notes (Signed)
Virtual GYN patient with irregular cycle.  Pt has not had cycle for 5 months then started bleeding x-mas day. Bleeding first day was heavy, dark red pt still spotting  (dark red)  only having to change pad 3 times a day and cramping. Pain 8/10x Pt notes having fibroids.   Pt requesting IBP 800mg  for cramping.

## 2020-12-28 NOTE — Progress Notes (Signed)
TELEHEALTH GYNECOLOGY VISIT ENCOUNTER NOTE  Provider location: [facility] [place]   I connected with Erica Nguyen on 12/28/20 at  2:40 PM EST by telephone at home and verified that I am speaking with the correct person using two identifiers. Patient was unable to do MyChart audiovisual encounter due to technical difficulties, she tried several times.    I discussed the limitations, risks, security and privacy concerns of performing an evaluation and management service by telephone and the availability of in person appointments. I also discussed with the patient that there may be a patient responsible charge related to this service. The patient expressed understanding and agreed to proceed. Attempted video visit which was unsuccessful. Telephone visit done. Patient is at home, provider is at home.    History:  Erica Nguyen is a 50 y.o. [phone] female being evaluated today for abnormal vaginal bleeding, with abdominal cramping.  Hx of fibroids; no recent imaging.   She reports no cycle for 4 months, and then started bleeding on Christmas. Continues to bleed, however not as heavy. The first few days were very heavy and painful. She is nott taking hormone therapy or birth control.  She reports taking ibuprofen 800 mg which helps with the pain.   No dizziness when standing.      Past Medical History:  Diagnosis Date   Abnormal Pap smear    Diabetes mellitus    insulin-dependent type 2   Family history of adverse reaction to anesthesia    sister has n/v   Fibroid    HNP (herniated nucleus pulposus)    Hypertension    chronic hypertension   Pregnancy induced hypertension    Past Surgical History:  Procedure Laterality Date   BACK SURGERY     CARDIAC CATHETERIZATION  02/29/2008   Normal coronary arteries -- Normal LV (left ventricular) systolic function -- Mild to moderate elevation in left ventricular end-diastolic  pressure secondary to hypertension,  diabetes and obesity   CHOLECYSTECTOMY  1995   COLPOSCOPY     CORONARY STENT INTERVENTION N/A 07/21/2017   Procedure: Coronary Stent Intervention;  Surgeon: [name], MD;  Location: [facility];  Service: Cardiovascular;  Laterality: N/A;   DECOMPRESSIVE LUMBAR LAMINECTOMY LEVEL 1 Right 11/22/2015   Procedure: DECOMPRESSIVE LUMBAR LAMINECTOMY L5-S1 ON RIGHT,FORAMINOTOMIES L5,S1 DISCECTOMY L5,S1;  Surgeon: Susa Day, MD;  Location: [facility];  Service: Orthopedics;  Laterality: Right;   GYNECOLOGIC CRYOSURGERY  1998   Hysteroscopy, D&C, Novasure ablation, removal of Intrauterine device  01/08/2008   LEFT HEART CATH AND CORONARY ANGIOGRAPHY N/A 07/21/2017   Procedure: Left Heart Cath and Coronary Angiography;  Surgeon: [name], MD;  Location: [facility];  Service: Cardiovascular;  Laterality: N/A;   LUMBAR LAMINECTOMY/DECOMPRESSION MICRODISCECTOMY Right 10/11/2016   Procedure: REVISION MICRO LUMBAR DECOMPRESSION L5-S1 RIGHT;  Surgeon: Susa Day, MD;  Location: [facility];  Service: Orthopedics;  Laterality: Right;   NOVASURE ABLATION  01/08/2008   The following portions of the patient's history were reviewed and updated as appropriate: allergies, current medications, past family history, past medical history, past social history, past surgical history and problem list.   Health Maintenance:  Normal pap and negative HRHPV on 2/21- abnormal. F/u pap in 1 year.    Review of Systems:  Pertinent items noted in HPI and remainder of comprehensive ROS otherwise negative.  Physical Exam:   General:  Alert, oriented and cooperative.   Mental Status: Normal mood and affect perceived. Normal judgment  and thought content.  Physical exam deferred due to nature of the encounter  Labs and Imaging No results found for this or any previous visit (from the past 336 hour(s)). No results found.    Assessment and Plan:   1. Fibroids, intramural  - US PELVIC COMPLETE WITH  TRANSVAGINAL; Future  2. Abnormal vaginal bleeding  She is without dizziness Rx: Megace and Naproxen  Rx: iron Will get an [place] since it has been a while since fibroids have been evaluated.  Discussed reasons to go to the ED Message sent to office for follow up in 6 weeks.      I discussed the assessment and treatment plan with the patient. The patient was provided an opportunity to ask questions and all were answered. The patient agreed with the plan and demonstrated an understanding of the instructions.   The patient was advised to call back or seek an in-person evaluation/go to the ED if the symptoms worsen or if the condition fails to improve as anticipated.  I provided 12 minutes of non-face-to-face time during this encounter.   Venia Carbon, NP [facility] [employer], [facility]

## 2020-12-29 ENCOUNTER — Ambulatory Visit: Payer: No Typology Code available for payment source | Attending: Internal Medicine

## 2020-12-29 DIAGNOSIS — Z23 Encounter for immunization: Secondary | ICD-10-CM

## 2020-12-29 NOTE — Progress Notes (Signed)
   Covid-19 Vaccination Clinic  Name:  Erica Nguyen    MRN: [id number] DOB: 1971-04-19  12/29/2020  Ms. Hands was observed post Covid-19 immunization for 15 minutes without incident. She was provided with Vaccine Information Sheet and instruction to access the V-Safe system.   [name] was instructed to call 911 with any severe reactions post vaccine: Marland Kitchen Difficulty breathing  . Swelling of face and throat  . A fast heartbeat  . A bad rash all over body  . Dizziness and weakness   Immunizations Administered    Name Date Dose VIS Date Route   Pfizer COVID-19 Vaccine 12/29/2020  5:58 PM 0.3 mL 10/11/2020 Intramuscular   Manufacturer: [place]   Lot: [id number]   [place]: [id number]

## 2021-01-02 DIAGNOSIS — N939 Abnormal uterine and vaginal bleeding, unspecified: Secondary | ICD-10-CM | POA: Insufficient documentation

## 2021-01-29 ENCOUNTER — Other Ambulatory Visit: Payer: Self-pay

## 2021-01-29 ENCOUNTER — Ambulatory Visit
Admission: EM | Admit: 2021-01-29 | Discharge: 2021-01-29 | Disposition: A | Discharge status: Home / Self Care | Payer: No Typology Code available for payment source | Attending: Family Medicine | Admitting: Family Medicine

## 2021-01-29 DIAGNOSIS — J00 Acute nasopharyngitis [common cold]: Secondary | ICD-10-CM

## 2021-01-29 MED ORDER — IPRATROPIUM BROMIDE 0.06 % NA SOLN: 2.0000 | Freq: Three times a day (TID) | NASAL | 12 refills | Status: DC | PRN

## 2021-01-29 NOTE — ED Triage Notes (Signed)
Pt c/o nasal congestion since Friday. She has been taking OTC allergy medication and sinus spray. She reports her nose is running, she feels some pnd and has occasional cough. She also reports some facial pain/pressure across her cheeks. Pt does report nausea, denies fever/chills/emesis/diarrhea.

## 2021-01-29 NOTE — Discharge Instructions (Addendum)
Discontinue cetirizine and start Xyzal 5 mg at bedtime. Also start Flonase which is a nasal steroid 2 sprays in each nares daily until symptoms resolve.  You can pick up Xyzal and Flonase at [facility] tends to be cheaper than purchasing at [facility].  For acute nasal congestion and nasal drainage she may use Atrovent nasal spray this is a prescription which is sent to [facility] 2 sprays in each nares up to 3 times per day.

## 2021-01-29 NOTE — ED Provider Notes (Signed)
[name]    CSN: [id number] Arrival date & time: 01/29/21  1346      History   Chief Complaint Chief Complaint  Patient presents with  . Nasal Congestion    HPI Erica Nguyen is a 50 y.o. female.   HPI  Patient presents today with postnasal drainage, congestion followed by periods of nasal drainage.  She reports an occasional cough as a reaction to the drainage in her throat.  She does not have any headache or facial pressure.  The pain is localized to the inner nares.  She has been taking cetirizine and using an over-the-counter nasal spray for the last 3 days without relief of symptoms.  She suffers from type 2 diabetes which is uncontrolled with an A1c of 11 therefore is intolerant of steroids.  She denies fever or any known sick contacts.  Past Medical History:  Diagnosis Date  . Abnormal Pap smear   . Diabetes mellitus    insulin-dependent type 2  . Family history of adverse reaction to anesthesia    sister has n/v  . Fibroid   . HNP (herniated nucleus pulposus)   . Hypertension    chronic hypertension  . Pregnancy induced hypertension     Patient Active Problem List   Diagnosis Date Noted  . Abnormal vaginal bleeding 01/02/2021  . Abnormal Pap smear of cervix 02/10/2020  . NSTEMI (non-ST elevated myocardial infarction) ([place]) 07/31/2017  . Acute non Q wave myocardial infarction ([place]) 07/20/2017  . Ambulatory dysfunction   . Intractable pain   . Uncontrolled type 2 diabetes mellitus without complication, with long-term current use of insulin   . Sciatica of right side   . Hyperglycemia 10/09/2016  . Lumbar herniated disc 10/09/2016  . Myelopathy ([place]) 10/09/2016  . Hypercholesterolemia 01/23/2016  . Vitamin D deficiency 01/23/2016  . Vaginal candidiasis 11/29/2015  . Spinal stenosis of lumbar region 11/22/2015  . Obesity, Class II, BMI 35-39.9, with comorbidity 10/15/2015  . Lumbosacral radiculopathy due to osteoarthritis of spine 06/10/2015  .  Uncontrolled type 2 diabetes mellitus with hyperglycemia, with long-term current use of insulin ([place]) 06/10/2015  . Fibroids, intramural 06/27/2014  . Chest pain 01/06/2012  . Dyspnea 01/06/2012  . Controlled type 2 diabetes mellitus with hyperglycemia ([place]) 09/25/2007  . Essential hypertension 09/25/2007    Past Surgical History:  Procedure Laterality Date  . BACK SURGERY    . CARDIAC CATHETERIZATION  02/29/2008   Normal coronary arteries -- Normal LV (left ventricular) systolic function -- Mild to moderate elevation in left ventricular end-diastolic  pressure secondary to hypertension, diabetes and obesity  . CHOLECYSTECTOMY  1995  . COLPOSCOPY    . CORONARY STENT INTERVENTION N/A 07/21/2017   Procedure: Coronary Stent Intervention;  Surgeon: [name], MD;  Location: [facility];  Service: Cardiovascular;  Laterality: N/A;  . DECOMPRESSIVE LUMBAR LAMINECTOMY LEVEL 1 Right 11/22/2015   Procedure: DECOMPRESSIVE LUMBAR LAMINECTOMY L5-S1 ON RIGHT,FORAMINOTOMIES L5,S1 DISCECTOMY L5,S1;  Surgeon: Susa Day, MD;  Location: [facility];  Service: Orthopedics;  Laterality: Right;  . GYNECOLOGIC CRYOSURGERY  1998  . Hysteroscopy, D&C, Novasure ablation, removal of Intrauterine device  01/08/2008  . LEFT HEART CATH AND CORONARY ANGIOGRAPHY N/A 07/21/2017   Procedure: Left Heart Cath and Coronary Angiography;  Surgeon: [name], MD;  Location: [facility];  Service: Cardiovascular;  Laterality: N/A;  . LUMBAR LAMINECTOMY/DECOMPRESSION MICRODISCECTOMY Right 10/11/2016   Procedure: REVISION MICRO LUMBAR DECOMPRESSION L5-S1 RIGHT;  Surgeon: Susa Day, MD;  Location: [facility];  Service: Orthopedics;  Laterality: Right;  . NOVASURE ABLATION  01/08/2008    OB History    Gravida  3   Para  1   Term  1   Preterm      AB  2   Living  1     SAB  2   IAB      Ectopic      Multiple      Live Births  1            Home Medications    Prior to Admission  medications   Medication Sig Start Date End Date Taking? Authorizing Provider  acetaminophen (TYLENOL) 500 MG tablet Take 1,000 mg by mouth every 6 (six) hours as needed for moderate pain or headache.   Yes [provider]  aspirin EC 81 MG EC tablet Take 1 tablet (81 mg total) by mouth daily. 07/24/17  Yes [name], MD  baclofen (LIORESAL) 10 MG tablet TAKE 0.5-1 TABLETS (5-10 MG TOTAL) BY MOUTH AT BEDTIME AS NEEDED FOR MUSCLE SPASMS. 03/19/20  Yes Hilts, Legrand Como, MD  Dulaglutide (TRULICITY) 0.86 VH/8.4ON SOPN Inject into the skin. 11/05/19  Yes [provider]  Ferrous Sulfate (IRON) 325 (65 Fe) MG TABS Take 1 tablet (325 mg total) by mouth daily. Take with meals 12/28/20  Yes Rasch, Anderson Malta I, NP  glucose blood test strip For blood sugar monitoring 3x daily with the One Touch Verio Flex glucometer 10/22/18  Yes [provider]  LANTUS SOLOSTAR 100 UNIT/ML Solostar Pen INJECT 50 UNITS DOSE INTO THE SKIN DAILY. 03/10/19  Yes [provider]  lisinopril (ZESTRIL) 40 MG tablet TAKE 1 TABLET BY MOUTH EVERY DAY 10/13/20  Yes Cleaver, Jossie Ng, NP  megestrol (MEGACE) 20 MG tablet Take 3 tablets (60 mg total) by mouth 2 (two) times daily. Take for 14 days. 12/28/20  Yes Rasch, Anderson Malta I, NP  metoprolol succinate (TOPROL-XL) 50 MG 24 hr tablet TAKE 1 TABLET BY MOUTH TWICE A DAY 02/15/20  Yes Almyra Deforest, PA  naproxen (NAPROSYN) 250 MG tablet Take 2 tablets (500 mg total) by mouth 2 (two) times daily with a meal. 12/28/20  Yes Rasch, Anderson Malta I, NP  nitroGLYCERIN (NITROSTAT) 0.4 MG SL tablet Place 1 tablet (0.4 mg total) under the tongue every 5 (five) minutes x 3 doses as needed for chest pain. 07/23/17  Yes [name], MD  rosuvastatin (CRESTOR) 40 MG tablet TAKE 1 TABLET BY MOUTH EVERY DAY 12/12/20  Yes Lelon Perla, MD  Cholecalciferol (VITAMIN D-3) 125 MCG (5000 UT) TABS Take 1 tablet by mouth daily. Patient not taking: No sig reported 12/27/19   Hilts, Michael, MD   ezetimibe (ZETIA) 10 MG tablet Take 1 tablet (10 mg total) by mouth daily. 02/23/20 08/05/20  Lelon Perla, MD  fluconazole (DIFLUCAN) 150 MG tablet Take 1 tablet (150 mg total) by mouth daily. 12/28/20   Rasch, Anderson Malta I, NP  insulin lispro (HUMALOG) 100 UNIT/ML injection Inject 20 Units into the skin daily before supper.     [provider]  Lactobacillus Rhamnosus, GG, ([facility] PROBIOTIC, LACTOBACILLUS,) CAPS Take 1 tablet by mouth daily. 04/28/20   Hall-Potvin, [place], PA-C  miconazole (MICOTIN) 2 % powder Apply topically as needed for itching. 08/05/20   Hall-Potvin, [place], PA-C  gabapentin (NEURONTIN) 300 MG capsule 1 PO q HS, may increase to 1 PO TID if needed 12/27/19 08/05/20  Hilts, Legrand Como, MD    Family History Family History  Problem Relation  Age of Onset  . Heart disease Mother   . Diabetes Mother   . Hypertension Father   . Diabetes Father   . Kidney failure Father   . Stroke Father   . Hypertension Paternal Grandmother   . Diabetes Paternal Grandmother   . Stroke Paternal Grandmother   . Diabetes Maternal Grandmother   . Ovarian cancer Maternal Grandmother   . Diabetes Paternal Grandfather     Social History Social History   Tobacco Use  . Smoking status: Never Smoker  . Smokeless tobacco: Never Used  Vaping Use  . Vaping Use: Never used  Substance Use Topics  . Alcohol use: No  . Drug use: No     Allergies   Amoxicillin   Review of Systems Review of Systems Pertinent negatives listed in HPI Physical Exam Triage Vital Signs ED Triage Vitals  Enc Vitals Group     BP 01/29/21 1540 115/83     Pulse Rate 01/29/21 1540 (!) 114     Resp 01/29/21 1540 18     Temp 01/29/21 1540 99.1 F (37.3 C)     Temp Source 01/29/21 1540 Oral     SpO2 01/29/21 1540 98 %     Weight 01/29/21 1537 240 lb (108.9 kg)     Height 01/29/21 1537 5\' 6"  (1.676 m)     Head Circumference --      Peak Flow --      Pain Score 01/29/21 1537 0     Pain Loc --      Pain  Edu? --      Excl. in [place]? --    No data found.  Updated Vital Signs BP 115/83 (BP Location: Left Arm)   Pulse (!) 114   Temp 99.1 F (37.3 C) (Oral)   Resp 18   Ht 5\' 6"  (1.676 m)   Wt 240 lb (108.9 kg)   LMP 01/23/2021 (Approximate)   SpO2 98%   BMI 38.74 kg/m   Visual Acuity Right Eye Distance:   Left Eye Distance:   Bilateral Distance:    Right Eye Near:   Left Eye Near:    Bilateral Near:     Physical Exam  General Appearance:    Alert, cooperative, no distress  HENT:   Normocephalic, ears normal, nares mucosal edema with congestion and  rhinorrhea, oropharynx clear   Eyes:    PERRL, conjunctiva/corneas clear, EOM's intact       Lungs:     Clear to auscultation bilaterally, respirations unlabored  Heart:    Regular rate and rhythm  Neurologic:   Awake, alert, oriented x 3. No apparent focal neurological           defect.      UC Treatments / Results  Labs (all labs ordered are listed, but only abnormal results are displayed) Labs Reviewed - No data to display  EKG   Radiology No results found.  Procedures Procedures (including critical care time)  Medications Ordered in UC Medications - No data to display  Initial Impression / Assessment and Plan / UC Course  I have reviewed the triage vital signs and the nursing notes.  Pertinent labs & imaging results that were available during my care of the patient were reviewed by me and considered in my medical decision making (see chart for details).    Patient presents today with acute rhinitis symptoms in the absence of any other associated symptoms.  She is afebrile and negative any other symptoms concerning  for possible Covid.  Recommended OTC levocetirizine discontinue cetirizine along with daily regimen of Flonase.  Prescribed Atrovent nasal spray 3 times daily as needed.  Encourage fluid hydration in patients as rhinitis symptoms can occur intermittently and take time for complete resolution of symptoms.   Patient verbalized understanding and agreement with plan peer Final Clinical Impressions(s) / UC Diagnoses   Final diagnoses:  Acute rhinitis     Discharge Instructions     Discontinue cetirizine and start Xyzal 5 mg at bedtime. Also start Flonase which is a nasal steroid 2 sprays in each nares daily until symptoms resolve.  You can pick up Xyzal and Flonase at [facility] tends to be cheaper than purchasing at [facility].  For acute nasal congestion and nasal drainage she may use Atrovent nasal spray this is a prescription which is sent to [facility] 2 sprays in each nares up to 3 times per day.    ED Prescriptions    Medication Sig Dispense Auth. Provider   ipratropium (ATROVENT) 0.06 % nasal spray Place 2 sprays into both nostrils 3 (three) times daily as needed for rhinitis. 15 mL Scot Jun, FNP     PDMP not reviewed this encounter.   Scot Jun, FNP 01/29/21 [phone]

## 2021-01-31 ENCOUNTER — Other Ambulatory Visit: Payer: Self-pay

## 2021-01-31 MED ORDER — FLUCONAZOLE 150 MG PO TABS
150.0000 mg | ORAL_TABLET | Freq: Once | ORAL | 0 refills | Status: AC
Start: 2021-01-31 — End: 2021-01-31

## 2021-01-31 NOTE — Telephone Encounter (Signed)
Patient is requesting refill on diflucan called into patient pharmacy.

## 2021-02-08 ENCOUNTER — Encounter (INDEPENDENT_AMBULATORY_CARE_PROVIDER_SITE_OTHER): Payer: Self-pay | Admitting: Obstetrics and Gynecology

## 2021-02-08 NOTE — Progress Notes (Signed)
I have attempted without success to contact this patient by phone, I left 3 messages on answering machine to call Office to start visit.

## 2021-02-10 NOTE — Progress Notes (Signed)
Patient did not keep her appointment today.  Lezlie Lye, NP 02/10/2021 5:28 PM

## 2021-03-09 ENCOUNTER — Other Ambulatory Visit: Payer: Self-pay | Admitting: Cardiology

## 2021-03-09 DIAGNOSIS — E785 Hyperlipidemia, unspecified: Secondary | ICD-10-CM

## 2021-04-27 ENCOUNTER — Telehealth: Payer: Self-pay

## 2021-04-27 DIAGNOSIS — B3731 Acute candidiasis of vulva and vagina: Secondary | ICD-10-CM

## 2021-04-27 DIAGNOSIS — B373 Candidiasis of vulva and vagina: Secondary | ICD-10-CM

## 2021-04-27 MED ORDER — FLUCONAZOLE 150 MG PO TABS: 150.0000 mg | ORAL_TABLET | Freq: Every day | ORAL | 1 refills | Status: DC

## 2021-04-27 NOTE — Telephone Encounter (Signed)
Pt c/o vaginal d/c with itching. Request Rx for diflucan. States she gets yeast infections frequently. Will send in diflucan rx 150mg  po with no refill to [facility].

## 2021-05-04 ENCOUNTER — Ambulatory Visit: Payer: Self-pay

## 2021-05-18 ENCOUNTER — Encounter: Payer: Self-pay | Admitting: Family

## 2021-05-18 ENCOUNTER — Ambulatory Visit (INDEPENDENT_AMBULATORY_CARE_PROVIDER_SITE_OTHER): Payer: PRIVATE HEALTH INSURANCE | Admitting: Family

## 2021-05-18 ENCOUNTER — Other Ambulatory Visit: Payer: Self-pay

## 2021-05-18 VITALS — BP 120/70 | HR 68 | Ht 65.0 in | Wt 240.0 lb

## 2021-05-18 DIAGNOSIS — I25118 Atherosclerotic heart disease of native coronary artery with other forms of angina pectoris: Secondary | ICD-10-CM | POA: Diagnosis not present

## 2021-05-18 DIAGNOSIS — E785 Hyperlipidemia, unspecified: Secondary | ICD-10-CM

## 2021-05-18 DIAGNOSIS — Z794 Long term (current) use of insulin: Secondary | ICD-10-CM

## 2021-05-18 DIAGNOSIS — E119 Type 2 diabetes mellitus without complications: Secondary | ICD-10-CM | POA: Diagnosis not present

## 2021-05-18 DIAGNOSIS — I255 Ischemic cardiomyopathy: Secondary | ICD-10-CM

## 2021-05-18 MED ORDER — EZETIMIBE 10 MG PO TABS: 10.0000 mg | ORAL_TABLET | Freq: Every day | ORAL | 1 refills | Status: DC

## 2021-05-18 MED ORDER — LISINOPRIL 20 MG PO TABS: 20.0000 mg | ORAL_TABLET | Freq: Every day | ORAL | 5 refills | Status: DC

## 2021-05-18 MED ORDER — NITROGLYCERIN 0.4 MG SL SUBL: 0.4000 mg | SUBLINGUAL_TABLET | SUBLINGUAL | 12 refills | Status: AC | PRN
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
## 2021-05-18 NOTE — Patient Instructions (Signed)
Medication Instructions:  Your physician has recommended you make the following change in your medication:   REDUCE Lisinopril to 20mg  daily  *If you need a refill on your cardiac medications before your next appointment, please call your pharmacy*  Lab Work: None ordered today  Testing/Procedures: Your EKG today showed normal sinus rhythm which is a great result! No signs of blockages.  Follow-Up: At [facility], you and your health needs are our priority.  As part of our continuing mission to provide you with exceptional heart care, we have created designated Provider Care Teams.  These Care Teams include your primary Cardiologist (physician) and Advanced Practice Providers (APPs -  Physician Assistants and Nurse Practitioners) who all work together to provide you with the care you need, when you need it.  We recommend signing up for the patient portal called "MyChart".  Sign up information is provided on this After Visit Summary.  MyChart is used to connect with patients for Virtual Visits (Telemedicine).  Patients are able to view lab/test results, encounter notes, upcoming appointments, etc.  Non-urgent messages can be sent to your provider as well.   To learn more about what you can do with MyChart, go to [email or web address].    Your next appointment:   1 month(s)  The format for your next appointment:   In Person  Provider:   You may see Kirk Ruths, MD or one of the following Advanced Practice Providers on your designated Care Team:    Murray Hodgkins, NP  Christell Faith, PA-C  Marrianne Mood, PA-C  Cadence Kathlen Mody, [place]  Laurann Montana, NP    Other Instructions   Pursed Lip Breathing  How to perform pursed lip breathing Being short of breath can make you tense and anxious. Before you start this breathing exercise, take a minute to relax your shoulders and close your eyes. Then: 1. Start the exercise by closing your mouth. 2. Breathe in through your  nose, taking a normal breath. You can do this at your normal rate of breathing. If you feel you are not getting enough air, breathe in while slowly counting to 2 or 3. 3. Pucker (purse) your lips as if you were going to whistle. 4. Gently tighten the muscles of your abdomenor press on your abdomen to help push the air out. 5. Breathe out slowly through your pursed lips. Take at least twice as long to breathe out as it takes you to breathe in. 6. Make sure that you breathe out all of the air, but do not force air out. 7. Repeat the exercise until your breathing improves. Ask your health care provider how often and how long to do this exercise.

## 2021-05-19 ENCOUNTER — Encounter: Payer: Self-pay | Admitting: Family

## 2021-05-23 NOTE — Addendum Note (Signed)
Addended by: Jacqulynn Cadet on: 05/23/2021 03:38 PM   Modules accepted: Orders

## 2021-06-28 ENCOUNTER — Ambulatory Visit
Admission: EM | Admit: 2021-06-28 | Discharge: 2021-06-28 | Disposition: A | Discharge status: Home / Self Care | Payer: No Typology Code available for payment source | Attending: Physician Assistant | Admitting: Physician Assistant

## 2021-06-28 ENCOUNTER — Other Ambulatory Visit: Payer: Self-pay

## 2021-06-28 DIAGNOSIS — N898 Other specified noninflammatory disorders of vagina: Secondary | ICD-10-CM | POA: Diagnosis not present

## 2021-06-28 MED ORDER — METRONIDAZOLE 500 MG PO TABS: 500.0000 mg | ORAL_TABLET | Freq: Two times a day (BID) | ORAL | 0 refills | Status: DC

## 2021-06-28 NOTE — ED Triage Notes (Signed)
Patient presents to Urgent Care with complaints of vaginal discharge with odor x 3 days. Pt self treated with diflucan. Pt has a hx of yeast infections.   Denies fever, abdominal pain or urinary symptoms.

## 2021-06-28 NOTE — Discharge Instructions (Addendum)
Take metronidazole twice daily for 7 days.  Do not drink any alcohol while taking this medication and for 72 hours after completing course that will cause you to vomit.  Wear loosefitting cotton underwear and use hypoallergenic soaps and detergents.  We will contact you if any additional treatment is required based on your swab results.  If you have any worsening symptoms please return for reevaluation.

## 2021-06-28 NOTE — ED Provider Notes (Signed)
[name]    CSN: [id number] Arrival date & time: 06/28/21  1800      History   Chief Complaint Chief Complaint  Patient presents with   Vaginitis    HPI Erica Nguyen is a 50 y.o. female.   Patient presents today with a 3-day history of increased vaginal discharge.  She describes discharge as copious and malodorous.  She initially thought symptoms were related to yeast infection as she has a history of recurrent yeast infection related to uncontrolled hyperglycemia.  She took Diflucan that was available from a previous prescription which did not provide any relief of symptoms and a few days later she developed the odor.  She does have a history of bacterial vaginosis and states current symptoms are similar to previous episodes of this condition.  She has no concern for STI but is open to testing.  She did recently travel out of town and used a new soap which could have contributed to symptoms.  She denies any recent antibiotic use.  Denies any abdominal pain, pelvic pain, fever, nausea, vomiting.   Past Medical History:  Diagnosis Date   Abnormal Pap smear    Diabetes mellitus    insulin-dependent type 2   Family history of adverse reaction to anesthesia    sister has n/v   Fibroid    HNP (herniated nucleus pulposus)    Hypertension    chronic hypertension   Pregnancy induced hypertension     Patient Active Problem List   Diagnosis Date Noted   Abnormal vaginal bleeding 01/02/2021   Abnormal Pap smear of cervix 02/10/2020   NSTEMI (non-ST elevated myocardial infarction) ([place]) 07/31/2017   Acute non Q wave myocardial infarction ([place]) 07/20/2017   Ambulatory dysfunction    Intractable pain    Uncontrolled type 2 diabetes mellitus without complication, with long-term current use of insulin    Sciatica of right side    Hyperglycemia 10/09/2016   Lumbar herniated disc 10/09/2016   Myelopathy ([place]) 10/09/2016   Hypercholesterolemia 01/23/2016   Vitamin D  deficiency 01/23/2016   Vaginal candidiasis 11/29/2015   Spinal stenosis of lumbar region 11/22/2015   Obesity, Class II, BMI 35-39.9, with comorbidity 10/15/2015   Lumbosacral radiculopathy due to osteoarthritis of spine 06/10/2015   Uncontrolled type 2 diabetes mellitus with hyperglycemia, with long-term current use of insulin ([place]) 06/10/2015   Fibroids, intramural 06/27/2014   Chest pain 01/06/2012   Dyspnea 01/06/2012   Controlled type 2 diabetes mellitus with hyperglycemia ([place]) 09/25/2007   Essential hypertension 09/25/2007    Past Surgical History:  Procedure Laterality Date   BACK SURGERY     CARDIAC CATHETERIZATION  02/29/2008   Normal coronary arteries -- Normal LV (left ventricular) systolic function -- Mild to moderate elevation in left ventricular end-diastolic  pressure secondary to hypertension, diabetes and obesity   CHOLECYSTECTOMY  1995   COLPOSCOPY     CORONARY STENT INTERVENTION N/A 07/21/2017   Procedure: Coronary Stent Intervention;  Surgeon: [name], MD;  Location: [place] [facility];  Service: Cardiovascular;  Laterality: N/A;   DECOMPRESSIVE LUMBAR LAMINECTOMY LEVEL 1 Right 11/22/2015   Procedure: DECOMPRESSIVE LUMBAR LAMINECTOMY L5-S1 ON RIGHT,FORAMINOTOMIES L5,S1 DISCECTOMY L5,S1;  Surgeon: Susa Day, MD;  Location: [facility];  Service: Orthopedics;  Laterality: Right;   GYNECOLOGIC CRYOSURGERY  1998   Hysteroscopy, D&C, Novasure ablation, removal of Intrauterine device  01/08/2008   LEFT HEART CATH AND CORONARY ANGIOGRAPHY N/A 07/21/2017   Procedure: Left Heart Cath and Coronary Angiography;  Surgeon: [name], MD;  Location: [place] [facility];  Service: Cardiovascular;  Laterality: N/A;   LUMBAR LAMINECTOMY/DECOMPRESSION MICRODISCECTOMY Right 10/11/2016   Procedure: REVISION MICRO LUMBAR DECOMPRESSION L5-S1 RIGHT;  Surgeon: Susa Day, MD;  Location: [facility];  Service: Orthopedics;  Laterality: Right;   NOVASURE ABLATION  01/08/2008    OB  History     Gravida  3   Para  1   Term  1   Preterm      AB  2   Living  1      SAB  2   IAB      Ectopic      Multiple      Live Births  1            Home Medications    Prior to Admission medications   Medication Sig Start Date End Date Taking? Authorizing Provider  metroNIDAZOLE (FLAGYL) 500 MG tablet Take 1 tablet (500 mg total) by mouth 2 (two) times daily. 06/28/21  Yes Onica Davidovich, Derry Skill, PA-C  acetaminophen (TYLENOL) 500 MG tablet Take 1,000 mg by mouth every 6 (six) hours as needed for moderate pain or headache.    [provider]  aspirin EC 81 MG EC tablet Take 1 tablet (81 mg total) by mouth daily. 07/24/17   [name], MD  Dulaglutide (TRULICITY) 9.83 JA/2.5KN SOPN Inject into the skin. 11/05/19   [provider]  ezetimibe (ZETIA) 10 MG tablet Take 1 tablet (10 mg total) by mouth daily. 05/18/21   Loel Dubonnet, NP  fluconazole (DIFLUCAN) 150 MG tablet Take 1 tablet (150 mg total) by mouth daily. 04/27/21   Shelly Bombard, MD  glucose blood test strip For blood sugar monitoring 3x daily with the One Touch Verio Flex glucometer 10/22/18   [provider]  insulin lispro (HUMALOG) 100 UNIT/ML injection Inject 20 Units into the skin daily before supper.     [provider]  LANTUS SOLOSTAR 100 UNIT/ML Solostar Pen INJECT 50 UNITS DOSE INTO THE SKIN DAILY. 03/10/19   [provider]  lisinopril (ZESTRIL) 20 MG tablet Take 1 tablet (20 mg total) by mouth daily. 05/18/21 11/14/21  Loel Dubonnet, NP  megestrol (MEGACE) 20 MG tablet Take 3 tablets (60 mg total) by mouth 2 (two) times daily. Take for 14 days. 12/28/20   Rasch, Anderson Malta I, NP  metoprolol succinate (TOPROL-XL) 50 MG 24 hr tablet TAKE 1 TABLET BY MOUTH TWICE A DAY 02/15/20   Almyra Deforest, PA  naproxen (NAPROSYN) 250 MG tablet Take 2 tablets (500 mg total) by mouth 2 (two) times daily with a meal. 12/28/20   Rasch, Artist Pais, NP  nitroGLYCERIN (NITROSTAT) 0.4  MG SL tablet Place 1 tablet (0.4 mg total) under the tongue every 5 (five) minutes x 3 doses as needed for chest pain. 05/18/21   Loel Dubonnet, NP  OZEMPIC, 0.25 OR 0.5 MG/DOSE, 2 MG/1.5ML SOPN Inject 0.25 mg into the skin once a week. 04/30/21   [provider]  rosuvastatin (CRESTOR) 40 MG tablet TAKE 1 TABLET BY MOUTH EVERY DAY 12/12/20   Lelon Perla, MD  gabapentin (NEURONTIN) 300 MG capsule 1 PO q HS, may increase to 1 PO TID if needed 12/27/19 08/05/20  Hilts, Legrand Como, MD    Family History Family History  Problem Relation Age of Onset   Heart disease Mother    Diabetes Mother    Hypertension Father    Diabetes Father  Kidney failure Father    Stroke Father    Hypertension Paternal Grandmother    Diabetes Paternal Grandmother    Stroke Paternal Grandmother    Diabetes Maternal Grandmother    Ovarian cancer Maternal Grandmother    Diabetes Paternal Grandfather     Social History Social History   Tobacco Use   Smoking status: Never   Smokeless tobacco: Never  Vaping Use   Vaping Use: Never used  Substance Use Topics   Alcohol use: No   Drug use: No     Allergies   Amoxicillin   Review of Systems Review of Systems  Constitutional:  Negative for activity change, appetite change, fatigue and fever.  Respiratory:  Negative for cough and shortness of breath.   Cardiovascular:  Negative for chest pain.  Gastrointestinal:  Negative for abdominal pain, diarrhea, nausea and vomiting.  Genitourinary:  Positive for vaginal discharge. Negative for flank pain, frequency, hematuria, pelvic pain, urgency, vaginal bleeding and vaginal pain.  Musculoskeletal:  Negative for arthralgias and myalgias.    Physical Exam Triage Vital Signs ED Triage Vitals  Enc Vitals Group     BP 06/28/21 1850 119/81     Pulse Rate 06/28/21 1850 85     Resp 06/28/21 1850 16     Temp 06/28/21 1850 98.4 F (36.9 C)     Temp Source 06/28/21 1850 Oral     SpO2 06/28/21 1850 99 %      Weight --      Height --      Head Circumference --      Peak Flow --      Pain Score 06/28/21 1852 0     Pain Loc --      Pain Edu? --      Excl. in [place]? --    No data found.  Updated Vital Signs BP 119/81 (BP Location: Left Arm)   Pulse 85   Temp 98.4 F (36.9 C) (Oral)   Resp 16   LMP  (Within Months) Comment: about 4 months ago per pt report  SpO2 99%   Visual Acuity Right Eye Distance:   Left Eye Distance:   Bilateral Distance:    Right Eye Near:   Left Eye Near:    Bilateral Near:     Physical Exam Vitals reviewed.  Constitutional:      General: She is awake. She is not in acute distress.    Appearance: Normal appearance. She is normal weight. She is not ill-appearing.     Comments: Very pleasant female appears stated age in no acute distress  HENT:     Head: Normocephalic and atraumatic.  Cardiovascular:     Rate and Rhythm: Normal rate and regular rhythm.     Heart sounds: Normal heart sounds, S1 normal and S2 normal. No murmur heard. Pulmonary:     Effort: Pulmonary effort is normal.     Breath sounds: Normal breath sounds. No wheezing, rhonchi or rales.     Comments: Clear to auscultation bilaterally Abdominal:     General: Bowel sounds are normal.     Palpations: Abdomen is soft.     Tenderness: There is no abdominal tenderness. There is no right CVA tenderness, left CVA tenderness, guarding or rebound.     Comments: Benign abdominal exam  Genitourinary:    Comments: Exam deferred Psychiatric:        Behavior: Behavior is cooperative.     UC Treatments / Results  Labs (all labs ordered are  listed, but only abnormal results are displayed) Labs Reviewed  POCT URINALYSIS DIP (MANUAL ENTRY)  CERVICOVAGINAL ANCILLARY ONLY    EKG   Radiology No results found.  Procedures Procedures (including critical care time)  Medications Ordered in UC Medications - No data to display  Initial Impression / Assessment and Plan / UC Course  I have  reviewed the triage vital signs and the nursing notes.  Pertinent labs & imaging results that were available during my care of the patient were reviewed by me and considered in my medical decision making (see chart for details).      Concern for bacterial vaginosis given clinical presentation.  STI swab collected today-results pending.  Will empirically treat with metronidazole 500 mg twice daily for 7 days.  Patient was instructed to avoid any alcohol while taking this medication and for 72 hours after completing it due to Antabuse side effects.  Recommended she use hypoallergenic soaps and detergents and wear loosefitting cotton underwear.  Discussed alarm symptoms that warrant emergent evaluation.  Strict return precautions given to which patient expressed understanding.  Final Clinical Impressions(s) / UC Diagnoses   Final diagnoses:  Vaginal discharge  Vaginal irritation  Vaginal odor     Discharge Instructions      Take metronidazole twice daily for 7 days.  Do not drink any alcohol while taking this medication and for 72 hours after completing course that will cause you to vomit.  Wear loosefitting cotton underwear and use hypoallergenic soaps and detergents.  We will contact you if any additional treatment is required based on your swab results.  If you have any worsening symptoms please return for reevaluation.     ED Prescriptions     Medication Sig Dispense Auth. Provider   metroNIDAZOLE (FLAGYL) 500 MG tablet Take 1 tablet (500 mg total) by mouth 2 (two) times daily. 14 tablet Andersen Iorio, Derry Skill, PA-C      PDMP not reviewed this encounter.   Terrilee Croak, PA-C 06/28/21 1906

## 2021-07-02 ENCOUNTER — Encounter: Payer: PRIVATE HEALTH INSURANCE | Admitting: Physician Assistant

## 2021-07-02 LAB — CERVICOVAGINAL ANCILLARY ONLY
Bacterial Vaginitis (gardnerella): POSITIVE — AB
Candida Glabrata: NEGATIVE
Candida Vaginitis: POSITIVE — AB
Chlamydia: NEGATIVE
Comment: NEGATIVE
Comment: NEGATIVE
Comment: NEGATIVE
Comment: NEGATIVE
Comment: NEGATIVE
Comment: NORMAL
Neisseria Gonorrhea: NEGATIVE
Trichomonas: NEGATIVE

## 2021-07-04 ENCOUNTER — Other Ambulatory Visit: Payer: Self-pay | Admitting: Family Medicine

## 2021-07-04 ENCOUNTER — Telehealth (HOSPITAL_COMMUNITY): Payer: Self-pay | Admitting: Emergency Medicine

## 2021-07-04 MED ORDER — FLUCONAZOLE 150 MG PO TABS: 150.0000 mg | ORAL_TABLET | Freq: Once | ORAL | 0 refills | Status: AC

## 2021-07-04 NOTE — Progress Notes (Signed)
This encounter was created in error - please disregard.

## 2021-07-09 ENCOUNTER — Encounter: Payer: Self-pay | Admitting: Obstetrics and Gynecology

## 2021-07-09 ENCOUNTER — Ambulatory Visit: Payer: No Typology Code available for payment source | Admitting: Obstetrics and Gynecology

## 2021-07-09 ENCOUNTER — Other Ambulatory Visit: Payer: Self-pay

## 2021-07-09 ENCOUNTER — Other Ambulatory Visit (HOSPITAL_COMMUNITY)
Admission: RE | Admit: 2021-07-09 | Discharge: 2021-07-09 | Disposition: A | Discharge status: Home / Self Care | Payer: No Typology Code available for payment source | Source: Ambulatory Visit | Attending: Obstetrics and Gynecology | Admitting: Obstetrics and Gynecology

## 2021-07-09 VITALS — BP 159/84 | HR 94 | Wt 243.0 lb

## 2021-07-09 DIAGNOSIS — Z1231 Encounter for screening mammogram for malignant neoplasm of breast: Secondary | ICD-10-CM

## 2021-07-09 DIAGNOSIS — N761 Subacute and chronic vaginitis: Secondary | ICD-10-CM

## 2021-07-09 DIAGNOSIS — N644 Mastodynia: Secondary | ICD-10-CM | POA: Diagnosis not present

## 2021-07-09 MED ORDER — NYSTATIN-TRIAMCINOLONE 100000-0.1 UNIT/GM-% EX OINT: 1.0000 "application " | TOPICAL_OINTMENT | Freq: Two times a day (BID) | CUTANEOUS | 0 refills | Status: DC

## 2021-07-09 MED ORDER — MEGESTROL ACETATE 20 MG PO TABS: 60.0000 mg | ORAL_TABLET | Freq: Two times a day (BID) | ORAL | 1 refills | Status: DC

## 2021-07-09 NOTE — Progress Notes (Signed)
50 yo P1 presenting today for the evaluation of recurrent yeast infection. Patient admits to being a poorly controlled diabetic due to lack of insurance. She recently found employment and has been able to afford her insulin. She re-established care with a PCP who is helping with diabetic management. Patient reports pruritic vulva without a discharge. She is sexually active without complaints. She also reports intermittent left breast pain over the past 2 weeks. The pain is present at rest and is transient. Patient also reports irregular menses with her last period occuring in February and was very light.  Past Medical History:  Diagnosis Date   Abnormal Pap smear    Diabetes mellitus    insulin-dependent type 2   Family history of adverse reaction to anesthesia    sister has n/v   Fibroid    HNP (herniated nucleus pulposus)    Hypertension    chronic hypertension   Pregnancy induced hypertension    Past Surgical History:  Procedure Laterality Date   BACK SURGERY     CARDIAC CATHETERIZATION  02/29/2008   Normal coronary arteries -- Normal LV (left ventricular) systolic function -- Mild to moderate elevation in left ventricular end-diastolic  pressure secondary to hypertension, diabetes and obesity   CHOLECYSTECTOMY  1995   COLPOSCOPY     CORONARY STENT INTERVENTION N/A 07/21/2017   Procedure: Coronary Stent Intervention;  Surgeon: [name], MD;  Location: [place] [facility];  Service: Cardiovascular;  Laterality: N/A;   DECOMPRESSIVE LUMBAR LAMINECTOMY LEVEL 1 Right 11/22/2015   Procedure: DECOMPRESSIVE LUMBAR LAMINECTOMY L5-S1 ON RIGHT,FORAMINOTOMIES L5,S1 DISCECTOMY L5,S1;  Surgeon: Susa Day, MD;  Location: [facility];  Service: Orthopedics;  Laterality: Right;   GYNECOLOGIC CRYOSURGERY  1998   Hysteroscopy, D&C, Novasure ablation, removal of Intrauterine device  01/08/2008   LEFT HEART CATH AND CORONARY ANGIOGRAPHY N/A 07/21/2017   Procedure: Left Heart Cath and Coronary  Angiography;  Surgeon: [name], MD;  Location: [place] [facility];  Service: Cardiovascular;  Laterality: N/A;   LUMBAR LAMINECTOMY/DECOMPRESSION MICRODISCECTOMY Right 10/11/2016   Procedure: REVISION MICRO LUMBAR DECOMPRESSION L5-S1 RIGHT;  Surgeon: Susa Day, MD;  Location: [facility];  Service: Orthopedics;  Laterality: Right;   NOVASURE ABLATION  01/08/2008   Family History  Problem Relation Age of Onset   Heart disease Mother    Diabetes Mother    Hypertension Father    Diabetes Father    Kidney failure Father    Stroke Father    Hypertension Paternal Grandmother    Diabetes Paternal Grandmother    Stroke Paternal Grandmother    Diabetes Maternal Grandmother    Ovarian cancer Maternal Grandmother    Diabetes Paternal Grandfather    Social History   Tobacco Use   Smoking status: Never   Smokeless tobacco: Never  Vaping Use   Vaping Use: Never used  Substance Use Topics   Alcohol use: No   Drug use: No   ROS See pertinent in HPI. All other systems reviewed and non contributory  Blood pressure (!) 159/84, pulse 94, weight 243 lb (110.2 kg). GENERAL: Well-developed, well-nourished female in no acute distress.  BREASTS: Symmetric in size. No palpable masses or lymphadenopathy, skin changes, or nipple drainage. ABDOMEN: Soft, nontender, nondistended. No organomegaly. PELVIC: Normal external female genitalia with evidence of previous boils on mons pubis and small labia excoriations. Vagina is pink and rugated.  Normal discharge. Normal appearing cervix. Uterus is normal in size.  No adnexal mass or tenderness. EXTREMITIES: No cyanosis, clubbing, or edema,  2+ distal pulses.  A/P 50 yo with suspected yeast infection - Vaginal swab collected - Rx Nystatin provided - Patient with normal pap smear in 2021 - Screening and diagnostic mammogram ordered - Reassurance provided and instructions on how to prevent and care for boils - Discussed importance of diabetes  management - Patient will be contacted with abnormal results

## 2021-07-09 NOTE — Progress Notes (Signed)
Pt states she gets recurrent yeast infections, also has 2 "bumps" just near groin area. Pt is Type 2 diabetic on insulin.   Pt is scheduled for AEX in August.

## 2021-07-10 LAB — CERVICOVAGINAL ANCILLARY ONLY
Bacterial Vaginitis (gardnerella): NEGATIVE
Candida Glabrata: NEGATIVE
Candida Vaginitis: POSITIVE — AB
Comment: NEGATIVE
Comment: NEGATIVE
Comment: NEGATIVE

## 2021-07-10 MED ORDER — FLUCONAZOLE 150 MG PO TABS: 150.0000 mg | ORAL_TABLET | Freq: Once | ORAL | 3 refills | Status: AC

## 2021-07-10 NOTE — Addendum Note (Signed)
Addended by: Mora Bellman on: 07/10/2021 12:06 PM   Modules accepted: Orders

## 2021-07-18 ENCOUNTER — Ambulatory Visit (HOSPITAL_BASED_OUTPATIENT_CLINIC_OR_DEPARTMENT_OTHER): Payer: No Typology Code available for payment source | Admitting: Radiology

## 2021-07-23 ENCOUNTER — Ambulatory Visit (HOSPITAL_BASED_OUTPATIENT_CLINIC_OR_DEPARTMENT_OTHER)
Admission: RE | Admit: 2021-07-23 | Discharge: 2021-07-23 | Disposition: A | Discharge status: Home / Self Care | Payer: No Typology Code available for payment source | Source: Ambulatory Visit | Attending: Obstetrics and Gynecology | Admitting: Obstetrics and Gynecology

## 2021-07-23 ENCOUNTER — Other Ambulatory Visit: Payer: Self-pay | Admitting: Obstetrics and Gynecology

## 2021-07-23 ENCOUNTER — Other Ambulatory Visit: Payer: Self-pay

## 2021-07-23 DIAGNOSIS — Z1231 Encounter for screening mammogram for malignant neoplasm of breast: Secondary | ICD-10-CM | POA: Insufficient documentation

## 2021-08-01 ENCOUNTER — Ambulatory Visit: Payer: No Typology Code available for payment source | Admitting: Women's Health

## 2021-09-12 ENCOUNTER — Telehealth: Payer: Self-pay | Admitting: Cardiology

## 2021-09-12 DIAGNOSIS — I2510 Atherosclerotic heart disease of native coronary artery without angina pectoris: Secondary | ICD-10-CM

## 2021-09-12 DIAGNOSIS — E785 Hyperlipidemia, unspecified: Secondary | ICD-10-CM

## 2021-09-12 MED ORDER — ROSUVASTATIN CALCIUM 40 MG PO TABS: 40.0000 mg | ORAL_TABLET | Freq: Every day | ORAL | 2 refills | Status: DC

## 2021-09-12 NOTE — Telephone Encounter (Signed)
Refills has been sent to the pharmacy. 

## 2021-09-12 NOTE — Telephone Encounter (Signed)
*  STAT* If patient is at the pharmacy, call can be transferred to refill team.   1. Which medications need to be refilled? (please list name of each medication and dose if known)  rosuvastatin (CRESTOR) 40 MG tablet  2. Which pharmacy/location (including street and city if local pharmacy) is medication to be sent to? [facility], [place], [place] [address]  3. Do they need a 30 day or 90 day supply?   90 day supply

## 2021-09-19 MED ORDER — ROSUVASTATIN CALCIUM 40 MG PO TABS: 40.0000 mg | ORAL_TABLET | Freq: Every day | ORAL | 3 refills | Status: DC

## 2021-09-19 MED ORDER — METOPROLOL SUCCINATE ER 50 MG PO TB24: 50.0000 mg | ORAL_TABLET | Freq: Two times a day (BID) | ORAL | 3 refills | Status: DC

## 2021-09-19 NOTE — Telephone Encounter (Signed)
Spoke to patient she requested refills to be sent to [facility] on [address] in [place].Prescriptions sent to pharmacy.

## 2021-09-19 NOTE — Addendum Note (Signed)
Addended by: Kathyrn Lass on: 09/19/2021 02:37 PM   Modules accepted: Orders

## 2021-09-19 NOTE — Telephone Encounter (Signed)
*STAT* If patient is at the pharmacy, call be transferred to refill team.     1. Which medications need to be refilled? (please list name of each medication and dose if known)  rosuvastatin (CRESTOR) 40 MG tablet; metoprolol succinate (TOPROL-XL) 50 MG 24 hr tablet   2. Which pharmacy/location (including street and city if local pharmacy) is medication to be sent to?  [facility]  [place] [facility]   3. Do they need a 30 day or 90 day supply?   90 day supply

## 2021-09-26 ENCOUNTER — Telehealth: Payer: Self-pay | Admitting: Cardiology

## 2021-09-26 NOTE — Telephone Encounter (Signed)
*  STAT* If patient is at the pharmacy, call can be transferred to refill team.   1. Which medications need to be refilled? (please list name of each medication and dose if known)  ezetimibe (ZETIA) 10 MG tablet   2. Which pharmacy/location (including street and city if local pharmacy) is medication to be sent to?  [facility] [place]  3. Do they need a 30 day or 90 day supply? 90ds

## 2021-09-27 ENCOUNTER — Other Ambulatory Visit: Payer: Self-pay

## 2021-09-27 DIAGNOSIS — E785 Hyperlipidemia, unspecified: Secondary | ICD-10-CM

## 2021-09-27 MED ORDER — EZETIMIBE 10 MG PO TABS: 10.0000 mg | ORAL_TABLET | Freq: Every day | ORAL | 1 refills | Status: DC

## 2021-10-05 ENCOUNTER — Telehealth: Payer: Self-pay | Admitting: Cardiology

## 2021-10-05 DIAGNOSIS — E785 Hyperlipidemia, unspecified: Secondary | ICD-10-CM

## 2021-10-05 MED ORDER — EZETIMIBE 10 MG PO TABS: 10.0000 mg | ORAL_TABLET | Freq: Every day | ORAL | 1 refills | Status: DC

## 2021-10-05 NOTE — Telephone Encounter (Signed)
*  STAT* If patient is at the pharmacy, call can be transferred to refill team.   1. Which medications need to be refilled? (please list name of each medication and dose if known) ezetimibe (ZETIA) 10 MG tablet  2. Which pharmacy/location (including street and city if local pharmacy) is medication to be sent to? [facility] [place]  3. Do they need a 30 day or 90 day supply? 90   Patient is out of medication and is going out of town. Please advise

## 2021-11-06 ENCOUNTER — Encounter (HOSPITAL_BASED_OUTPATIENT_CLINIC_OR_DEPARTMENT_OTHER): Payer: Self-pay | Admitting: Family

## 2021-11-06 ENCOUNTER — Ambulatory Visit (HOSPITAL_BASED_OUTPATIENT_CLINIC_OR_DEPARTMENT_OTHER): Payer: No Typology Code available for payment source | Admitting: Family

## 2021-11-06 ENCOUNTER — Encounter (HOSPITAL_BASED_OUTPATIENT_CLINIC_OR_DEPARTMENT_OTHER): Payer: Self-pay | Admitting: Cardiology

## 2021-11-06 ENCOUNTER — Other Ambulatory Visit: Payer: Self-pay

## 2021-11-06 VITALS — BP 114/78 | HR 84 | Ht 65.0 in | Wt 234.0 lb

## 2021-11-06 DIAGNOSIS — I25118 Atherosclerotic heart disease of native coronary artery with other forms of angina pectoris: Secondary | ICD-10-CM | POA: Diagnosis not present

## 2021-11-06 DIAGNOSIS — R079 Chest pain, unspecified: Secondary | ICD-10-CM

## 2021-11-06 DIAGNOSIS — E785 Hyperlipidemia, unspecified: Secondary | ICD-10-CM

## 2021-11-06 DIAGNOSIS — R0789 Other chest pain: Secondary | ICD-10-CM | POA: Diagnosis not present

## 2021-11-06 NOTE — Patient Instructions (Addendum)
Medication Instructions:  Continue your current medications.   *If you need a refill on your cardiac medications before your next appointment, please call your pharmacy*   Lab Work: Your physician recommends that you return for lab work this week or next at [facility] for fasting lipid panel.   Testing/Procedures: Your physician has requested that you have a lexiscan myoview.  Please follow instruction sheet, as given.   Follow-Up: At [facility], you and your health needs are our priority.  As part of our continuing mission to provide you with exceptional heart care, we have created designated Provider Care Teams.  These Care Teams include your primary Cardiologist (physician) and Advanced Practice Providers (APPs -  Physician Assistants and Nurse Practitioners) who all work together to provide you with the care you need, when you need it.  We recommend signing up for the patient portal called "MyChart".  Sign up information is provided on this After Visit Summary.  MyChart is used to connect with patients for Virtual Visits (Telemedicine).  Patients are able to view lab/test results, encounter notes, upcoming appointments, etc.  Non-urgent messages can be sent to your provider as well.   To learn more about what you can do with MyChart, go to [email or web address].    Your next appointment:   4 month(s)  The format for your next appointment:   In Person  Provider:   Kirk Ruths, MD or Loel Dubonnet, NP   Other Instructions  If your blood pressure is consistently less than 110 (top number) or if you are consistently lightheaded or dizzy please contact our office.    [facility] [address], [place], [place] [address] Phone: [phone]  November 06, 2021    Erica Nguyen DOB: 1971/09/16 MRN: [id number] [address] [place] [address]   Dear Erica Nguyen,  You are scheduled for a Myocardial Perfusion  Imaging Study on: ___________  Please arrive 15 minutes prior to your appointment time for registration and insurance purposes.  The test will take approximately 3 to 4 hours to complete; you may bring reading material.  If someone comes with you to your appointment, they will need to remain in the main lobby due to limited space in the testing area. **If you are pregnant or breastfeeding, please notify the nuclear lab prior to your appointment**  How to prepare for your Myocardial Perfusion Test: Do not eat or drink 3 hours prior to your test, except you may have water. Do not consume products containing caffeine (regular or decaffeinated) 12 hours prior to your test. (ex: coffee, chocolate, sodas, tea). Do bring a list of your current medications with you.  If not listed below, you may take your medications as normal. Do not take metoprolol (Lopressor, Toprol) for 24 hours prior to the test.  Bring the medication to your appointment as you may be required to take it once the test is complete. Do wear comfortable clothes (no dresses or overalls) and walking shoes, tennis shoes preferred (No heels or open toe shoes are allowed). Do NOT wear cologne, perfume, aftershave, or lotions (deodorant is allowed). If these instructions are not followed, your test will have to be rescheduled.  Please report to [address], [address] for your test.  If you have questions or concerns about your appointment, you can call the Nuclear Lab at [phone].  If you cannot keep your appointment, please provide 24 hours notification to the Nuclear Lab,  to avoid a possible $50 charge to your account.
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
## 2021-11-21 ENCOUNTER — Telehealth (HOSPITAL_COMMUNITY): Payer: Self-pay

## 2021-11-21 ENCOUNTER — Telehealth: Payer: Self-pay

## 2021-11-21 NOTE — Telephone Encounter (Signed)
Attempted to contact the patient. Her voicemail hasn't been set up. Will try again later. S.Cilicia Borden EMTP

## 2021-11-21 NOTE — Telephone Encounter (Signed)
Follow Up:    Patient is returning your call from today. 

## 2021-11-22 ENCOUNTER — Other Ambulatory Visit: Payer: Self-pay

## 2021-11-22 ENCOUNTER — Ambulatory Visit (HOSPITAL_COMMUNITY): Payer: No Typology Code available for payment source | Attending: Cardiovascular Disease

## 2021-11-22 DIAGNOSIS — R0789 Other chest pain: Secondary | ICD-10-CM | POA: Insufficient documentation

## 2021-11-22 MED ORDER — TECHNETIUM TC 99M TETROFOSMIN IV KIT
32.0000 | PACK | Freq: Once | INTRAVENOUS | Status: AC | PRN
  Administered 2021-11-22: 32 via INTRAVENOUS
  Filled 2021-11-22: qty 32

## 2021-11-22 MED ORDER — REGADENOSON 0.4 MG/5ML IV SOLN
0.4000 mg | Freq: Once | INTRAVENOUS | Status: AC
  Administered 2021-11-22: 0.4 mg via INTRAVENOUS

## 2021-11-23 ENCOUNTER — Ambulatory Visit (HOSPITAL_COMMUNITY): Payer: No Typology Code available for payment source | Attending: Cardiology

## 2021-11-23 LAB — MYOCARDIAL PERFUSION IMAGING
Base ST Depression (mm): 0 mm
LV dias vol: 72 mL (ref 46–106)
LV sys vol: 26 mL
Nuc Stress EF: 64 %
Peak HR: 111 {beats}/min
Rest HR: 85 {beats}/min
Rest Nuclear Isotope Dose: 30.8 mCi
SDS: 0
SRS: 1
SSS: 1
ST Depression (mm): 0 mm
Stress Nuclear Isotope Dose: 32 mCi
TID: 1.03

## 2021-11-23 MED ORDER — TECHNETIUM TC 99M TETROFOSMIN IV KIT
30.8000 | PACK | Freq: Once | INTRAVENOUS | Status: AC | PRN
  Filled 2021-11-23: qty 31

## 2021-11-26 ENCOUNTER — Encounter (HOSPITAL_BASED_OUTPATIENT_CLINIC_OR_DEPARTMENT_OTHER): Payer: Self-pay

## 2021-11-26 NOTE — Progress Notes (Signed)
Results shared via mychart

## 2021-11-27 NOTE — Progress Notes (Signed)
Seen by patient Erica Nguyen on 11/26/2021  9:45 PM

## 2021-12-31 ENCOUNTER — Other Ambulatory Visit: Payer: Self-pay

## 2021-12-31 ENCOUNTER — Encounter: Payer: Self-pay | Admitting: Emergency Medicine

## 2021-12-31 ENCOUNTER — Ambulatory Visit
Admission: EM | Admit: 2021-12-31 | Discharge: 2021-12-31 | Disposition: A | Discharge status: Home / Self Care | Payer: PRIVATE HEALTH INSURANCE | Attending: Internal Medicine | Admitting: Internal Medicine

## 2021-12-31 DIAGNOSIS — N898 Other specified noninflammatory disorders of vagina: Secondary | ICD-10-CM | POA: Diagnosis present

## 2021-12-31 DIAGNOSIS — J029 Acute pharyngitis, unspecified: Secondary | ICD-10-CM | POA: Diagnosis present

## 2021-12-31 DIAGNOSIS — J069 Acute upper respiratory infection, unspecified: Secondary | ICD-10-CM | POA: Insufficient documentation

## 2021-12-31 LAB — POCT RAPID STREP A (OFFICE): Rapid Strep A Screen: NEGATIVE

## 2021-12-31 LAB — POCT INFLUENZA A/B
Influenza A, POC: NEGATIVE
Influenza B, POC: NEGATIVE

## 2021-12-31 MED ORDER — BENZONATATE 100 MG PO CAPS: 100.0000 mg | ORAL_CAPSULE | Freq: Three times a day (TID) | ORAL | 0 refills | Status: DC | PRN

## 2021-12-31 MED ORDER — CETIRIZINE HCL 10 MG PO TABS: 10.0000 mg | ORAL_TABLET | Freq: Every day | ORAL | 0 refills | Status: DC

## 2021-12-31 MED ORDER — FLUTICASONE PROPIONATE 50 MCG/ACT NA SUSP: 1.0000 | Freq: Every day | NASAL | 0 refills | Status: DC

## 2021-12-31 MED ORDER — FLUCONAZOLE 150 MG PO TABS: 150.0000 mg | ORAL_TABLET | ORAL | 0 refills | Status: DC

## 2021-12-31 NOTE — ED Provider Notes (Signed)
[facility]    CSN: [id number] Arrival date & time: 12/31/21  0815      History   Chief Complaint Chief Complaint  Patient presents with   Cough   Sore Throat    HPI Erica Nguyen is a 52 y.o. female.   Patient presents with cough, sore throat, nasal congestion that has been present for approximately 2 days.  She also reports that she has had some vaginal itching that has been present for the same amount of time.  Denies any known fevers or sick contacts.  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  Denies chest pain, shortness of breath, nausea, vomiting, diarrhea, abdominal pain.  Patient has taken Coricidin HBP and Advil cold and flu with minimal improvement in symptoms.  Cough is nonproductive.  Patient denies urinary burning, urinary frequency, vaginal discharge, pelvic pain, abdominal pain, fever, hematuria.  She reports that she gets frequent yeast infections given uncontrolled diabetes.   Cough Sore Throat   Past Medical History:  Diagnosis Date   Abnormal Pap smear    Diabetes mellitus    insulin-dependent type 2   Family history of adverse reaction to anesthesia    sister has n/v   Fibroid    HNP (herniated nucleus pulposus)    Hypertension    chronic hypertension   Pregnancy induced hypertension     Patient Active Problem List   Diagnosis Date Noted   Abnormal vaginal bleeding 01/02/2021   Abnormal Pap smear of cervix 02/10/2020   NSTEMI (non-ST elevated myocardial infarction) ([place]) 07/31/2017   Acute non Q wave myocardial infarction ([place]) 07/20/2017   Ambulatory dysfunction    Intractable pain    Uncontrolled type 2 diabetes mellitus without complication, with long-term current use of insulin    Sciatica of right side    Hyperglycemia 10/09/2016   Lumbar herniated disc 10/09/2016   Myelopathy ([place]) 10/09/2016   Hypercholesterolemia 01/23/2016   Vitamin D deficiency 01/23/2016    Vaginal candidiasis 11/29/2015   Spinal stenosis of lumbar region 11/22/2015   Obesity, Class II, BMI 35-39.9, with comorbidity 10/15/2015   Lumbosacral radiculopathy due to osteoarthritis of spine 06/10/2015   Fibroids, intramural 06/27/2014   Chest pain 01/06/2012   Dyspnea 01/06/2012   Controlled type 2 diabetes mellitus with hyperglycemia ([place]) 09/25/2007   Essential hypertension 09/25/2007    Past Surgical History:  Procedure Laterality Date   BACK SURGERY     CARDIAC CATHETERIZATION  02/29/2008   Normal coronary arteries -- Normal LV (left ventricular) systolic function -- Mild to moderate elevation in left ventricular end-diastolic  pressure secondary to hypertension, diabetes and obesity   CHOLECYSTECTOMY  1995   COLPOSCOPY     CORONARY STENT INTERVENTION N/A 07/21/2017   Procedure: Coronary Stent Intervention;  Surgeon: [name], MD;  Location: [facility];  Service: Cardiovascular;  Laterality: N/A;   DECOMPRESSIVE LUMBAR LAMINECTOMY LEVEL 1 Right 11/22/2015   Procedure: DECOMPRESSIVE LUMBAR LAMINECTOMY L5-S1 ON RIGHT,FORAMINOTOMIES L5,S1 DISCECTOMY L5,S1;  Surgeon: Susa Day, MD;  Location: [facility];  Service: Orthopedics;  Laterality: Right;   GYNECOLOGIC CRYOSURGERY  1998   Hysteroscopy, D&C, Novasure ablation, removal of Intrauterine device  01/08/2008   LEFT HEART CATH AND CORONARY ANGIOGRAPHY N/A 07/21/2017   Procedure: Left Heart Cath and Coronary Angiography;  Surgeon: [name], MD;  Location: [facility];  Service: Cardiovascular;  Laterality: N/A;   LUMBAR LAMINECTOMY/DECOMPRESSION MICRODISCECTOMY Right 10/11/2016  Procedure: REVISION MICRO LUMBAR DECOMPRESSION L5-S1 RIGHT;  Surgeon: Susa Day, MD;  Location: [facility];  Service: Orthopedics;  Laterality: Right;   NOVASURE ABLATION  01/08/2008    OB History     Gravida  3   Para  1   Term  1   Preterm      AB  2   Living  1      SAB  2   IAB      Ectopic      Multiple       Live Births  1            Home Medications    Prior to Admission medications   Medication Sig Start Date End Date Taking? Authorizing Provider  benzonatate (TESSALON) 100 MG capsule Take 1 capsule (100 mg total) by mouth every 8 (eight) hours as needed for cough. 12/31/21  Yes Damyen Knoll, Michele Rockers, FNP  cetirizine (ZYRTEC) 10 MG tablet Take 1 tablet (10 mg total) by mouth daily for 10 days. 12/31/21 01/10/22 Yes Reem Fleury, Michele Rockers, FNP  fluconazole (DIFLUCAN) 150 MG tablet Take 1 tablet (150 mg total) by mouth every 3 (three) days. May take second pill in 3 days if no resolution of symptoms with first pill 12/31/21  Yes Norwin Aleman, [place] E, FNP  fluticasone (FLONASE) 50 MCG/ACT nasal spray Place 1 spray into both nostrils daily for 3 days. 12/31/21 01/03/22 Yes Lachandra Dettmann, Michele Rockers, FNP  acetaminophen (TYLENOL) 500 MG tablet Take 1,000 mg by mouth every 6 (six) hours as needed for moderate pain or headache.    [provider]  aspirin EC 81 MG EC tablet Take 1 tablet (81 mg total) by mouth daily. 07/24/17   [name], MD  ezetimibe (ZETIA) 10 MG tablet Take 1 tablet (10 mg total) by mouth daily. 10/05/21   Lelon Perla, MD  glucose blood test strip For blood sugar monitoring 3x daily with the One Touch Verio Flex glucometer 10/22/18   [provider]  insulin lispro (HUMALOG) 100 UNIT/ML injection Inject 20 Units into the skin daily before supper.     [provider]  LANTUS SOLOSTAR 100 UNIT/ML Solostar Pen INJECT 50 UNITS DOSE INTO THE SKIN DAILY. 03/10/19   [provider]  lisinopril (ZESTRIL) 20 MG tablet Take 1 tablet (20 mg total) by mouth daily. 05/18/21 11/14/21  Loel Dubonnet, NP  megestrol (MEGACE) 20 MG tablet Take 3 tablets (60 mg total) by mouth 2 (two) times daily. Take for 14 days. 07/09/21   Constant, Peggy, MD  metoprolol succinate (TOPROL-XL) 50 MG 24 hr tablet Take 1 tablet (50 mg total) by mouth 2 (two) times daily. Take with or immediately following  a meal. 09/19/21   Crenshaw, Denice Bors, MD  nitroGLYCERIN (NITROSTAT) 0.4 MG SL tablet Place 1 tablet (0.4 mg total) under the tongue every 5 (five) minutes x 3 doses as needed for chest pain. 05/18/21   Loel Dubonnet, NP  OZEMPIC, 0.25 OR 0.5 MG/DOSE, 2 MG/1.5ML SOPN Inject 0.25 mg into the skin once a week. 04/30/21   [provider]  rosuvastatin (CRESTOR) 40 MG tablet Take 1 tablet (40 mg total) by mouth daily. 09/19/21   Lelon Perla, MD  gabapentin (NEURONTIN) 300 MG capsule 1 PO q HS, may increase to 1 PO TID if needed 12/27/19 08/05/20  Hilts, Legrand Como, MD    Family History Family History  Problem Relation Age of Onset   Heart disease Mother  Diabetes Mother    Hypertension Father    Diabetes Father    Kidney failure Father    Stroke Father    Hypertension Paternal Grandmother    Diabetes Paternal Grandmother    Stroke Paternal Grandmother    Diabetes Maternal Grandmother    Ovarian cancer Maternal Grandmother    Diabetes Paternal Grandfather     Social History Social History   Tobacco Use   Smoking status: Never   Smokeless tobacco: Never  Vaping Use   Vaping Use: Never used  Substance Use Topics   Alcohol use: No   Drug use: No     Allergies   Amoxicillin   Review of Systems Review of Systems Per HPI  Physical Exam Triage Vital Signs ED Triage Vitals [12/31/21 0859]  Enc Vitals Group     BP (!) 152/85     Pulse Rate 90     Resp 16     Temp 98.5 F (36.9 C)     Temp Source Oral     SpO2 98 %     Weight      Height      Head Circumference      Peak Flow      Pain Score      Pain Loc      Pain Edu?      Excl. in [place]?    No data found.  Updated Vital Signs BP (!) 152/85 (BP Location: Left Arm)    Pulse 90    Temp 98.5 F (36.9 C) (Oral)    Resp 16    SpO2 98%   Visual Acuity Right Eye Distance:   Left Eye Distance:   Bilateral Distance:    Right Eye Near:   Left Eye Near:    Bilateral Near:     Physical  Exam Constitutional:      General: She is not in acute distress.    Appearance: Normal appearance. She is not toxic-appearing or diaphoretic.  HENT:     Head: Normocephalic and atraumatic.     Right Ear: Tympanic membrane and ear canal normal.     Left Ear: Tympanic membrane and ear canal normal.     Nose: Congestion present.     Mouth/Throat:     Mouth: Mucous membranes are moist.     Pharynx: Posterior oropharyngeal erythema present.  Eyes:     Extraocular Movements: Extraocular movements intact.     Conjunctiva/sclera: Conjunctivae normal.     Pupils: Pupils are equal, round, and reactive to light.  Cardiovascular:     Rate and Rhythm: Normal rate and regular rhythm.     Pulses: Normal pulses.     Heart sounds: Normal heart sounds.  Pulmonary:     Effort: Pulmonary effort is normal. No respiratory distress.     Breath sounds: Normal breath sounds. No wheezing.  Abdominal:     General: Abdomen is flat. Bowel sounds are normal.     Palpations: Abdomen is soft.  Genitourinary:    Comments: Deferred with shared decision making.  Self swab performed. Musculoskeletal:        General: Normal range of motion.     Cervical back: Normal range of motion.  Skin:    General: Skin is warm and dry.  Neurological:     General: No focal deficit present.     Mental Status: She is alert and oriented to person, place, and time. Mental status is at baseline.  Psychiatric:  Mood and Affect: Mood normal.        Behavior: Behavior normal.     UC Treatments / Results  Labs (all labs ordered are listed, but only abnormal results are displayed) Labs Reviewed  CULTURE, GROUP A STREP ([place])  NOVEL CORONAVIRUS, NAA  POCT INFLUENZA A/B  POCT RAPID STREP A (OFFICE)  CERVICOVAGINAL ANCILLARY ONLY    EKG   Radiology No results found.  Procedures Procedures (including critical care time)  Medications Ordered in UC Medications - No data to display  Initial Impression /  Assessment and Plan / UC Course  I have reviewed the triage vital signs and the nursing notes.  Pertinent labs & imaging results that were available during my care of the patient were reviewed by me and considered in my medical decision making (see chart for details).     Patient presents with symptoms likely from a viral upper respiratory infection. Differential includes bacterial pneumonia, sinusitis, allergic rhinitis, COVID-19, flu. Do not suspect underlying cardiopulmonary process. Symptoms seem unlikely related to ACS, CHF or COPD exacerbations, pneumonia, pneumothorax. Patient is nontoxic appearing and not in need of emergent medical intervention.  Rapid flu and rapid strep test were negative.  Throat culture and COVID-19 viral swab are pending.  Recommended symptom control with over the counter medications.  Patient sent prescriptions.  Vaginal swab pending.  Will opt to treat with Diflucan given patient's history of yeast infections.  Will add or change treatment once needed once vaginal swab result is complete.  Return if symptoms fail to improve in 1-2 weeks or you develop shortness of breath, chest pain, severe headache. Patient states understanding and is agreeable.  Discharged with PCP followup.  Final Clinical Impressions(s) / UC Diagnoses   Final diagnoses:  Sore throat  Viral upper respiratory tract infection with cough  Vaginal itching     Discharge Instructions      Your rapid strep and rapid flu were negative.  Throat culture and COVID-19 viral swab are pending.  We will call if they are positive.  You have been prescribed 3 medications to alleviate your symptoms as it appears that you have a viral upper respiratory infection.  No antibiotics needed as this is viral as opposed to bacterial.     ED Prescriptions     Medication Sig Dispense Auth. Provider   fluconazole (DIFLUCAN) 150 MG tablet Take 1 tablet (150 mg total) by mouth every 3 (three) days. May take  second pill in 3 days if no resolution of symptoms with first pill 2 tablet [place], [place] E, [place]   benzonatate (TESSALON) 100 MG capsule Take 1 capsule (100 mg total) by mouth every 8 (eight) hours as needed for cough. 21 capsule [place], [place] E, [place]   cetirizine (ZYRTEC) 10 MG tablet Take 1 tablet (10 mg total) by mouth daily for 10 days. 30 tablet [place], [place] E, [place]   fluticasone [facility]) 50 MCG/ACT nasal spray Place 1 spray into both nostrils daily for 3 days. 16 g Teodora Medici, [place]      PDMP not reviewed this encounter.   Teodora Medici,  12/31/21 1017

## 2021-12-31 NOTE — ED Triage Notes (Addendum)
Cough sore throat nasal congestion since Saturday. Also states she has external vaginal itching and is requesting treatment for a yeast infection

## 2021-12-31 NOTE — Discharge Instructions (Addendum)
Your rapid strep and rapid flu were negative.  Throat culture and COVID-19 viral swab are pending.  We will call if they are positive.  You have been prescribed 3 medications to alleviate your symptoms as it appears that you have a viral upper respiratory infection.  No antibiotics needed as this is viral as opposed to bacterial.

## 2022-01-01 LAB — CERVICOVAGINAL ANCILLARY ONLY
Bacterial Vaginitis (gardnerella): NEGATIVE
Candida Glabrata: NEGATIVE
Candida Vaginitis: POSITIVE — AB
Chlamydia: NEGATIVE
Comment: NEGATIVE
Comment: NEGATIVE
Comment: NEGATIVE
Comment: NEGATIVE
Comment: NEGATIVE
Comment: NORMAL
Neisseria Gonorrhea: NEGATIVE
Trichomonas: NEGATIVE

## 2022-01-01 LAB — NOVEL CORONAVIRUS, NAA: SARS-CoV-2, NAA: DETECTED — AB

## 2022-01-01 LAB — SARS-COV-2, NAA 2 DAY TAT

## 2022-01-03 LAB — CULTURE, GROUP A STREP (THRC)

## 2022-02-06 ENCOUNTER — Ambulatory Visit (HOSPITAL_BASED_OUTPATIENT_CLINIC_OR_DEPARTMENT_OTHER): Payer: No Typology Code available for payment source | Admitting: Family

## 2022-05-20 ENCOUNTER — Ambulatory Visit
Admission: EM | Admit: 2022-05-20 | Discharge: 2022-05-20 | Disposition: A | Discharge status: Home / Self Care | Payer: PRIVATE HEALTH INSURANCE | Attending: Internal Medicine | Admitting: Internal Medicine

## 2022-05-20 DIAGNOSIS — M25511 Pain in right shoulder: Secondary | ICD-10-CM | POA: Diagnosis not present

## 2022-05-20 MED ORDER — CYCLOBENZAPRINE HCL 5 MG PO TABS: 5.0000 mg | ORAL_TABLET | Freq: Two times a day (BID) | ORAL | 0 refills | Status: DC | PRN

## 2022-05-20 NOTE — ED Provider Notes (Signed)
[name]    CSN: [id number] Arrival date & time: 05/20/22  1037      History   Chief Complaint Chief Complaint  Patient presents with   herniated disk arm pain    HPI Erica Nguyen is a 51 y.o. female.   Patient presents with right shoulder and arm pain that has been present for approximately 1 month.  She reports that the pain increased in intensity over the past few days.  Denies any obvious injury to the area.  Pain radiates from the right shoulder blade down right arm.  Patient reports that she had left shoulder pain last year and had an MRI that showed "a disc problem" in her neck that she was told was attributing to her left shoulder pain.  She is concerned that it could be causing her right shoulder pain.  Patient has taken naproxen and Tylenol with minimal improvement in pain.  Pain exacerbated with movement and lying flat.    Past Medical History:  Diagnosis Date   Abnormal Pap smear    Diabetes mellitus    insulin-dependent type 2   Family history of adverse reaction to anesthesia    sister has n/v   Fibroid    HNP (herniated nucleus pulposus)    Hypertension    chronic hypertension   Pregnancy induced hypertension     Patient Active Problem List   Diagnosis Date Noted   Abnormal vaginal bleeding 01/02/2021   Abnormal Pap smear of cervix 02/10/2020   NSTEMI (non-ST elevated myocardial infarction) ([place]) 07/31/2017   Acute non Q wave myocardial infarction ([place]) 07/20/2017   Ambulatory dysfunction    Intractable pain    Uncontrolled type 2 diabetes mellitus without complication, with long-term current use of insulin    Sciatica of right side    Hyperglycemia 10/09/2016   Lumbar herniated disc 10/09/2016   Myelopathy ([place]) 10/09/2016   Hypercholesterolemia 01/23/2016   Vitamin D deficiency 01/23/2016   Vaginal candidiasis 11/29/2015   Spinal stenosis of lumbar region 11/22/2015   Obesity, Class II, BMI 35-39.9, with comorbidity 10/15/2015    Lumbosacral radiculopathy due to osteoarthritis of spine 06/10/2015   Fibroids, intramural 06/27/2014   Chest pain 01/06/2012   Dyspnea 01/06/2012   Controlled type 2 diabetes mellitus with hyperglycemia ([place]) 09/25/2007   Essential hypertension 09/25/2007    Past Surgical History:  Procedure Laterality Date   BACK SURGERY     CARDIAC CATHETERIZATION  02/29/2008   Normal coronary arteries -- Normal LV (left ventricular) systolic function -- Mild to moderate elevation in left ventricular end-diastolic  pressure secondary to hypertension, diabetes and obesity   CHOLECYSTECTOMY  1995   COLPOSCOPY     CORONARY STENT INTERVENTION N/A 07/21/2017   Procedure: Coronary Stent Intervention;  Surgeon: [name], MD;  Location: [facility];  Service: Cardiovascular;  Laterality: N/A;   DECOMPRESSIVE LUMBAR LAMINECTOMY LEVEL 1 Right 11/22/2015   Procedure: DECOMPRESSIVE LUMBAR LAMINECTOMY L5-S1 ON RIGHT,FORAMINOTOMIES L5,S1 DISCECTOMY L5,S1;  Surgeon: Susa Day, MD;  Location: [facility];  Service: Orthopedics;  Laterality: Right;   GYNECOLOGIC CRYOSURGERY  1998   Hysteroscopy, D&C, Novasure ablation, removal of Intrauterine device  01/08/2008   LEFT HEART CATH AND CORONARY ANGIOGRAPHY N/A 07/21/2017   Procedure: Left Heart Cath and Coronary Angiography;  Surgeon: [name], MD;  Location: [facility];  Service: Cardiovascular;  Laterality: N/A;   LUMBAR LAMINECTOMY/DECOMPRESSION MICRODISCECTOMY Right 10/11/2016   Procedure: REVISION MICRO LUMBAR DECOMPRESSION L5-S1 RIGHT;  Surgeon: Susa Day,  MD;  Location: [facility];  Service: Orthopedics;  Laterality: Right;   NOVASURE ABLATION  01/08/2008    OB History     Gravida  3   Para  1   Term  1   Preterm      AB  2   Living  1      SAB  2   IAB      Ectopic      Multiple      Live Births  1            Home Medications    Prior to Admission medications   Medication Sig Start Date End Date Taking?  Authorizing Provider  cyclobenzaprine (FLEXERIL) 5 MG tablet Take 1 tablet (5 mg total) by mouth 2 (two) times daily as needed for muscle spasms. 05/20/22  Yes Jetty Berland, Hildred Alamin E, FNP  acetaminophen (TYLENOL) 500 MG tablet Take 1,000 mg by mouth every 6 (six) hours as needed for moderate pain or headache.    [provider]  aspirin EC 81 MG EC tablet Take 1 tablet (81 mg total) by mouth daily. 07/24/17   [name], MD  benzonatate (TESSALON) 100 MG capsule Take 1 capsule (100 mg total) by mouth every 8 (eight) hours as needed for cough. Patient not taking: Reported on 05/20/2022 12/31/21   Teodora Medici, FNP  cetirizine (ZYRTEC) 10 MG tablet Take 1 tablet (10 mg total) by mouth daily for 10 days. 12/31/21 01/10/22  Teodora Medici, FNP  ezetimibe (ZETIA) 10 MG tablet Take 1 tablet (10 mg total) by mouth daily. 10/05/21   Lelon Perla, MD  fluconazole (DIFLUCAN) 150 MG tablet Take 1 tablet (150 mg total) by mouth every 3 (three) days. May take second pill in 3 days if no resolution of symptoms with first pill 12/31/21   Teodora Medici, FNP  fluticasone [facility]) 50 MCG/ACT nasal spray Place 1 spray into both nostrils daily for 3 days. 12/31/21 01/03/22  Teodora Medici, FNP  glucose blood test strip For blood sugar monitoring 3x daily with the One Touch Verio Flex glucometer 10/22/18   [provider]  insulin lispro (HUMALOG) 100 UNIT/ML injection Inject 20 Units into the skin daily before supper.     [provider]  LANTUS SOLOSTAR 100 UNIT/ML Solostar Pen INJECT 50 UNITS DOSE INTO THE SKIN DAILY. 03/10/19   [provider]  lisinopril (ZESTRIL) 20 MG tablet Take 1 tablet (20 mg total) by mouth daily. 05/18/21 11/14/21  Loel Dubonnet, NP  megestrol (MEGACE) 20 MG tablet Take 3 tablets (60 mg total) by mouth 2 (two) times daily. Take for 14 days. Patient not taking: Reported on 05/20/2022 07/09/21   Constant, Peggy, MD  metoprolol succinate (TOPROL-XL) 50 MG 24 hr tablet  Take 1 tablet (50 mg total) by mouth 2 (two) times daily. Take with or immediately following a meal. 09/19/21   Crenshaw, Denice Bors, MD  nitroGLYCERIN (NITROSTAT) 0.4 MG SL tablet Place 1 tablet (0.4 mg total) under the tongue every 5 (five) minutes x 3 doses as needed for chest pain. 05/18/21   Loel Dubonnet, NP  OZEMPIC, 0.25 OR 0.5 MG/DOSE, 2 MG/1.5ML SOPN Inject 0.25 mg into the skin once a week. 04/30/21   [provider]  rosuvastatin (CRESTOR) 40 MG tablet Take 1 tablet (40 mg total) by mouth daily. 09/19/21   Lelon Perla, MD  gabapentin (NEURONTIN) 300 MG capsule 1 PO q HS, may increase to 1  PO TID if needed 12/27/19 08/05/20  Hilts, Legrand Como, MD    Family History Family History  Problem Relation Age of Onset   Heart disease Mother    Diabetes Mother    Hypertension Father    Diabetes Father    Kidney failure Father    Stroke Father    Hypertension Paternal Grandmother    Diabetes Paternal Grandmother    Stroke Paternal Grandmother    Diabetes Maternal Grandmother    Ovarian cancer Maternal Grandmother    Diabetes Paternal Grandfather     Social History Social History   Tobacco Use   Smoking status: Never   Smokeless tobacco: Never  Vaping Use   Vaping Use: Never used  Substance Use Topics   Alcohol use: No   Drug use: No     Allergies   Amoxicillin   Review of Systems Review of Systems Per HPI  Physical Exam Triage Vital Signs ED Triage Vitals  Enc Vitals Group     BP 05/20/22 1141 125/80     Pulse Rate 05/20/22 1141 70     Resp 05/20/22 1141 16     Temp 05/20/22 1141 97.9 F (36.6 C)     Temp src --      SpO2 05/20/22 1141 98 %     Weight --      Height --      Head Circumference --      Peak Flow --      Pain Score 05/20/22 1139 7     Pain Loc --      Pain Edu? --      Excl. in [place]? --    No data found.  Updated Vital Signs BP 125/80   Pulse 70   Temp 97.9 F (36.6 C)   Resp 16   SpO2 98%   Visual Acuity Right Eye  Distance:   Left Eye Distance:   Bilateral Distance:    Right Eye Near:   Left Eye Near:    Bilateral Near:     Physical Exam Constitutional:      General: She is not in acute distress.    Appearance: Normal appearance. She is not toxic-appearing or diaphoretic.  HENT:     Head: Normocephalic and atraumatic.  Eyes:     Extraocular Movements: Extraocular movements intact.     Conjunctiva/sclera: Conjunctivae normal.  Pulmonary:     Effort: Pulmonary effort is normal.  Musculoskeletal:     Comments: Tenderness to palpation to right upper back near trapezius muscle.  Tenderness to palpation generalized throughout right shoulder as well.  Minimal pain with range of motion of shoulder.  Patient has full range of motion of shoulder with pain with abduction at 90 degrees.  Mild tenderness to right lateral neck.  No crepitus or step-off noted.  Grip strength 5/5.  Neurovascular intact.  Neurological:     General: No focal deficit present.     Mental Status: She is alert and oriented to person, place, and time. Mental status is at baseline.  Psychiatric:        Mood and Affect: Mood normal.        Behavior: Behavior normal.        Thought Content: Thought content normal.        Judgment: Judgment normal.     UC Treatments / Results  Labs (all labs ordered are listed, but only abnormal results are displayed) Labs Reviewed - No data to display  EKG   Radiology  No results found.  Procedures Procedures (including critical care time)  Medications Ordered in UC Medications - No data to display  Initial Impression / Assessment and Plan / UC Course  I have reviewed the triage vital signs and the nursing notes.  Pertinent labs & imaging results that were available during my care of the patient were reviewed by me and considered in my medical decision making (see chart for details).     Differential diagnoses include cervical radiculopathy versus muscle strain.  Do not think that  imaging is necessary given that it appears to be coming from patient's neck.  No direct spinal tenderness or crepitus as well.  Patient will need to follow-up with orthopedist for further evaluation and management.  Unable to review previous MRI results as it is unavailable in chart.  Will prescribe muscle relaxer to help alleviate discomfort as patient has uncontrolled diabetes and steroids would not be conducive.  Patient advised that muscle relaxer can cause drowsiness and to not drive while taking this medication.  Discussed supportive care and ice and heat application as well.  Patient already had established orthopedist.  Discussed return precautions.  Patient verbalized understanding and was agreeable with plan. Final Clinical Impressions(s) / UC Diagnoses   Final diagnoses:  Acute pain of right shoulder     Discharge Instructions      You have been prescribed muscle relaxer to help alleviate discomfort.  Please be advised that it can cause drowsiness.  Follow-up with orthopedist for further evaluation and management.    ED Prescriptions     Medication Sig Dispense Auth. Provider   cyclobenzaprine (FLEXERIL) 5 MG tablet Take 1 tablet (5 mg total) by mouth 2 (two) times daily as needed for muscle spasms. 20 tablet [place], Michele Rockers, [place]      PDMP not reviewed this encounter.   Teodora Medici, [place] 05/20/22 1246

## 2022-05-20 NOTE — ED Triage Notes (Signed)
Patient presents to Urgent Care with complaints of R shoulder and arm pain since 1 months ago with increasing intensity this weekend. Patient reports 1 year ago she had an mri due to pain in L shoulder and arm and was told she had a herniated disk and she believes that is what is causing the pain this time. Naproxen and tylenol with minimal relief .

## 2022-05-20 NOTE — Discharge Instructions (Signed)
You have been prescribed muscle relaxer to help alleviate discomfort.  Please be advised that it can cause drowsiness.  Follow-up with orthopedist for further evaluation and management.

## 2022-06-20 ENCOUNTER — Other Ambulatory Visit: Payer: Self-pay | Admitting: Cardiology

## 2022-06-20 DIAGNOSIS — E785 Hyperlipidemia, unspecified: Secondary | ICD-10-CM

## 2022-07-19 ENCOUNTER — Telehealth (HOSPITAL_BASED_OUTPATIENT_CLINIC_OR_DEPARTMENT_OTHER): Payer: Self-pay | Admitting: Family

## 2022-07-19 NOTE — Telephone Encounter (Signed)
Spoke with patient regarding new appointment  time of 8:50 am on 07/31/22 (with Laurann Montana, NP)  she voiced her understanding.

## 2022-07-31 ENCOUNTER — Ambulatory Visit (HOSPITAL_BASED_OUTPATIENT_CLINIC_OR_DEPARTMENT_OTHER): Payer: PRIVATE HEALTH INSURANCE | Admitting: Family
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
## 2022-08-04 ENCOUNTER — Ambulatory Visit
Admission: EM | Admit: 2022-08-04 | Discharge: 2022-08-04 | Disposition: A | Discharge status: Home / Self Care | Payer: PRIVATE HEALTH INSURANCE | Attending: Physician Assistant | Admitting: Physician Assistant

## 2022-08-04 DIAGNOSIS — R350 Frequency of micturition: Secondary | ICD-10-CM | POA: Insufficient documentation

## 2022-08-04 DIAGNOSIS — M545 Low back pain, unspecified: Secondary | ICD-10-CM | POA: Insufficient documentation

## 2022-08-04 DIAGNOSIS — B3731 Acute candidiasis of vulva and vagina: Secondary | ICD-10-CM | POA: Diagnosis present

## 2022-08-04 LAB — POCT URINALYSIS DIP (MANUAL ENTRY)
Bilirubin, UA: NEGATIVE
Blood, UA: NEGATIVE
Glucose, UA: 500 mg/dL — AB
Ketones, POC UA: NEGATIVE mg/dL
Leukocytes, UA: NEGATIVE
Nitrite, UA: NEGATIVE
Protein Ur, POC: NEGATIVE mg/dL
Spec Grav, UA: 1.02 (ref 1.010–1.025)
Urobilinogen, UA: 1 E.U./dL
pH, UA: 6.5 (ref 5.0–8.0)

## 2022-08-04 MED ORDER — FLUCONAZOLE 150 MG PO TABS: 150.0000 mg | ORAL_TABLET | ORAL | 0 refills | Status: DC

## 2022-08-04 MED ORDER — IBUPROFEN 800 MG PO TABS: 800.0000 mg | ORAL_TABLET | Freq: Three times a day (TID) | ORAL | 0 refills | Status: AC

## 2022-08-04 MED ORDER — TERCONAZOLE 0.4 % VA CREA: 1.0000 | TOPICAL_CREAM | Freq: Every day | VAGINAL | 0 refills | Status: DC

## 2022-08-04 MED ORDER — CLOTRIMAZOLE 1 % VA CREA: 1.0000 | TOPICAL_CREAM | Freq: Every day | VAGINAL | 0 refills | Status: DC

## 2022-08-04 NOTE — Discharge Instructions (Signed)
I believe you have a yeast infection.  Take fluconazole as prescribed.  Use terconazole cream for additional symptom relief.  I have called in ibuprofen for your back pain.  We will contact you if we need to arrange any additional treatment based on your swab results.  If you have any changing or worsening symptoms you should be seen immediately.

## 2022-08-04 NOTE — ED Provider Notes (Signed)
[name]    CSN: [id number] Arrival date & time: 08/04/22  1143      History   Chief Complaint Chief Complaint  Patient presents with   UTI sxs    HPI Erica Nguyen is a 51 y.o. female.   Patient presents today with several concerns.  Her primary concern today is a vaginal irritation that she believes is related to yeast infection.  She recently went out of town and reports that she had some dietary indiscretion that caused her to have temporary hyperglycemia.  She is working on bringing her blood sugar back under control but believes that this triggered a yeast infection.  She denies any changes to personal hygiene products including soaps or detergents.  She denies any recent antibiotics.  She has no specific concern for STIs.  She is requesting a topical medication as well (she is usually prescribed nystatin) as she feels there is an external component to her symptoms.  She also is concerned that she might have a UTI as she has had some urinary frequency as well as lower back pain.  She denies any bowel/bladder incontinence, lower extremity weakness, saddle anesthesia.  Denies any abdominal pain, pelvic pain, fever, nausea, vomiting.    Past Medical History:  Diagnosis Date   Abnormal Pap smear    Diabetes mellitus    insulin-dependent type 2   Family history of adverse reaction to anesthesia    sister has n/v   Fibroid    HNP (herniated nucleus pulposus)    Hypertension    chronic hypertension   Pregnancy induced hypertension     Patient Active Problem List   Diagnosis Date Noted   Abnormal vaginal bleeding 01/02/2021   Abnormal Pap smear of cervix 02/10/2020   NSTEMI (non-ST elevated myocardial infarction) ([place]) 07/31/2017   Acute non Q wave myocardial infarction ([place]) 07/20/2017   Ambulatory dysfunction    Intractable pain    Uncontrolled type 2 diabetes mellitus without complication, with long-term current use of insulin    Sciatica of right side     Hyperglycemia 10/09/2016   Lumbar herniated disc 10/09/2016   Myelopathy ([place]) 10/09/2016   Hypercholesterolemia 01/23/2016   Vitamin D deficiency 01/23/2016   Vaginal candidiasis 11/29/2015   Spinal stenosis of lumbar region 11/22/2015   Obesity, Class II, BMI 35-39.9, with comorbidity 10/15/2015   Lumbosacral radiculopathy due to osteoarthritis of spine 06/10/2015   Fibroids, intramural 06/27/2014   Chest pain 01/06/2012   Dyspnea 01/06/2012   Controlled type 2 diabetes mellitus with hyperglycemia ([place]) 09/25/2007   Essential hypertension 09/25/2007    Past Surgical History:  Procedure Laterality Date   BACK SURGERY     CARDIAC CATHETERIZATION  02/29/2008   Normal coronary arteries -- Normal LV (left ventricular) systolic function -- Mild to moderate elevation in left ventricular end-diastolic  pressure secondary to hypertension, diabetes and obesity   CHOLECYSTECTOMY  1995   COLPOSCOPY     CORONARY STENT INTERVENTION N/A 07/21/2017   Procedure: Coronary Stent Intervention;  Surgeon: [name], MD;  Location: [facility];  Service: Cardiovascular;  Laterality: N/A;   DECOMPRESSIVE LUMBAR LAMINECTOMY LEVEL 1 Right 11/22/2015   Procedure: DECOMPRESSIVE LUMBAR LAMINECTOMY L5-S1 ON RIGHT,FORAMINOTOMIES L5,S1 DISCECTOMY L5,S1;  Surgeon: Susa Day, MD;  Location: [facility];  Service: Orthopedics;  Laterality: Right;   GYNECOLOGIC CRYOSURGERY  1998   Hysteroscopy, D&C, Novasure ablation, removal of Intrauterine device  01/08/2008   LEFT HEART CATH AND CORONARY ANGIOGRAPHY N/A 07/21/2017  Procedure: Left Heart Cath and Coronary Angiography;  Surgeon: [name], MD;  Location: [facility];  Service: Cardiovascular;  Laterality: N/A;   LUMBAR LAMINECTOMY/DECOMPRESSION MICRODISCECTOMY Right 10/11/2016   Procedure: REVISION MICRO LUMBAR DECOMPRESSION L5-S1 RIGHT;  Surgeon: Susa Day, MD;  Location: [facility];  Service: Orthopedics;  Laterality: Right;   NOVASURE  ABLATION  01/08/2008    OB History     Gravida  3   Para  1   Term  1   Preterm      AB  2   Living  1      SAB  2   IAB      Ectopic      Multiple      Live Births  1            Home Medications    Prior to Admission medications   Medication Sig Start Date End Date Taking? Authorizing Provider  ibuprofen (ADVIL) 800 MG tablet Take 1 tablet (800 mg total) by mouth 3 (three) times daily. 08/04/22  Yes Cinch Ormond K, PA-C  terconazole (TERAZOL 7) 0.4 % vaginal cream Place 1 applicator vaginally at bedtime. 08/04/22  Yes Orville Widmann, Derry Skill, PA-C  acetaminophen (TYLENOL) 500 MG tablet Take 1,000 mg by mouth every 6 (six) hours as needed for moderate pain or headache.    [provider]  aspirin EC 81 MG EC tablet Take 1 tablet (81 mg total) by mouth daily. 07/24/17   [name], MD  benzonatate (TESSALON) 100 MG capsule Take 1 capsule (100 mg total) by mouth every 8 (eight) hours as needed for cough. Patient not taking: Reported on 05/20/2022 12/31/21   Teodora Medici, FNP  ezetimibe (ZETIA) 10 MG tablet Take 1 tablet (10 mg total) by mouth daily. 06/20/22   Lelon Perla, MD  fluconazole (DIFLUCAN) 150 MG tablet Take 1 tablet (150 mg total) by mouth every 3 (three) days. May take second pill in 3 days if no resolution of symptoms with first pill 08/04/22   Krystian Ferrentino K, PA-C  glucose blood test strip For blood sugar monitoring 3x daily with the One Touch Verio Flex glucometer 10/22/18   [provider]  insulin lispro (HUMALOG) 100 UNIT/ML injection Inject 20 Units into the skin daily before supper.     [provider]  LANTUS SOLOSTAR 100 UNIT/ML Solostar Pen INJECT 50 UNITS DOSE INTO THE SKIN DAILY. 03/10/19   [provider]  lisinopril (ZESTRIL) 20 MG tablet Take 1 tablet (20 mg total) by mouth daily. 05/18/21 11/14/21  Loel Dubonnet, NP  megestrol (MEGACE) 20 MG tablet Take 3 tablets (60 mg total) by mouth 2 (two) times daily.  Take for 14 days. Patient not taking: Reported on 05/20/2022 07/09/21   Constant, Peggy, MD  metoprolol succinate (TOPROL-XL) 50 MG 24 hr tablet Take 1 tablet (50 mg total) by mouth 2 (two) times daily. Take with or immediately following a meal. 09/19/21   Crenshaw, Denice Bors, MD  [facility] 5 MG/0.5ML Pen Inject into the skin. 07/05/22   [provider]  nitroGLYCERIN (NITROSTAT) 0.4 MG SL tablet Place 1 tablet (0.4 mg total) under the tongue every 5 (five) minutes x 3 doses as needed for chest pain. 05/18/21   Loel Dubonnet, NP  nystatin (MYCOSTATIN/NYSTOP) powder Apply topically 2 (two) times daily. 05/14/22   [provider]  rosuvastatin (CRESTOR) 40 MG tablet Take 1 tablet (40 mg total) by mouth daily. 09/19/21  Lelon Perla, MD  gabapentin (NEURONTIN) 300 MG capsule 1 PO q HS, may increase to 1 PO TID if needed 12/27/19 08/05/20  Hilts, Legrand Como, MD    Family History Family History  Problem Relation Age of Onset   Heart disease Mother    Diabetes Mother    Hypertension Father    Diabetes Father    Kidney failure Father    Stroke Father    Hypertension Paternal Grandmother    Diabetes Paternal Grandmother    Stroke Paternal Grandmother    Diabetes Maternal Grandmother    Ovarian cancer Maternal Grandmother    Diabetes Paternal Grandfather     Social History Social History   Tobacco Use   Smoking status: Never   Smokeless tobacco: Never  Vaping Use   Vaping Use: Never used  Substance Use Topics   Alcohol use: No   Drug use: No     Allergies   Amoxicillin   Review of Systems Review of Systems  Constitutional:  Positive for activity change. Negative for appetite change, fatigue and fever.  Gastrointestinal:  Negative for abdominal pain, diarrhea, nausea and vomiting.  Genitourinary:  Positive for frequency and vaginal pain. Negative for dysuria, pelvic pain, urgency, vaginal bleeding and vaginal discharge.  Musculoskeletal:  Positive for back pain.  Negative for arthralgias and myalgias.     Physical Exam Triage Vital Signs ED Triage Vitals [08/04/22 1206]  Enc Vitals Group     BP 127/84     Pulse Rate 91     Resp 18     Temp 98 F (36.7 C)     Temp Source Oral     SpO2 97 %     Weight      Height      Head Circumference      Peak Flow      Pain Score 0     Pain Loc      Pain Edu?      Excl. in [place]?    No data found.  Updated Vital Signs BP 127/84 (BP Location: Left Arm)   Pulse 91   Temp 98 F (36.7 C) (Oral)   Resp 18   SpO2 97%   Visual Acuity Right Eye Distance:   Left Eye Distance:   Bilateral Distance:    Right Eye Near:   Left Eye Near:    Bilateral Near:     Physical Exam Vitals reviewed.  Constitutional:      General: She is awake. She is not in acute distress.    Appearance: Normal appearance. She is well-developed. She is not ill-appearing.     Comments: Very pleasant female appears stated age in no acute distress sitting comfortably in exam room  HENT:     Head: Normocephalic and atraumatic.  Cardiovascular:     Rate and Rhythm: Normal rate and regular rhythm.     Heart sounds: Normal heart sounds, S1 normal and S2 normal. No murmur heard. Pulmonary:     Effort: Pulmonary effort is normal.     Breath sounds: Normal breath sounds. No wheezing, rhonchi or rales.     Comments: Clear to auscultation bilaterally Abdominal:     General: Bowel sounds are normal.     Palpations: Abdomen is soft.     Tenderness: There is no abdominal tenderness. There is no right CVA tenderness, left CVA tenderness, guarding or rebound.     Comments: Benign abdominal exam  Musculoskeletal:     Cervical back: No tenderness  or bony tenderness.     Thoracic back: No tenderness or bony tenderness.     Lumbar back: Tenderness present. No bony tenderness.     Comments: Mild tenderness palpation in lumbar region.  No pain percussion of vertebrae.  Psychiatric:        Behavior: Behavior is cooperative.      UC  Treatments / Results  Labs (all labs ordered are listed, but only abnormal results are displayed) Labs Reviewed  POCT URINALYSIS DIP (MANUAL ENTRY) - Abnormal; Notable for the following components:      Result Value   Color, UA light yellow (*)    Glucose, UA =500 (*)    All other components within normal limits  CERVICOVAGINAL ANCILLARY ONLY    EKG   Radiology No results found.  Procedures Procedures (including critical care time)  Medications Ordered in UC Medications - No data to display  Initial Impression / Assessment and Plan / UC Course  I have reviewed the triage vital signs and the nursing notes.  Pertinent labs & imaging results that were available during my care of the patient were reviewed by me and considered in my medical decision making (see chart for details).     Symptoms most consistent with vaginal yeast infection.  Suspect that urinary frequency is related to glucose urea which should be improving with better control of blood sugars.  Recommended patient continue pushing fluids.  Symptoms are more likely related to vaginal yeast infection.  STI swab was collected today-results pending.  We will contact her if we need to arrange additional treatment.  She was sent in several doses of Diflucan as well as a topical azole for symptom relief.  Initially prescribed clotrimazole but this is not covered by her insurance so terconazole was sent in its place.  I suspect that back pain is musculoskeletal in nature.  She was prescribed ibuprofen 800 mg to be taken up to 3 times a day as needed.  Discussed that she should not take additional NSAIDs with this medication.  Can use heat, rest, stretch for additional symptom relief.  If she has any worsening symptoms she needs to be seen immediately.  Strict return precautions given.  Final Clinical Impressions(s) / UC Diagnoses   Final diagnoses:  Vaginal yeast infection  Urinary frequency  Acute bilateral low back pain  without sciatica     Discharge Instructions      I believe you have a yeast infection.  Take fluconazole as prescribed.  Use terconazole cream for additional symptom relief.  I have called in ibuprofen for your back pain.  We will contact you if we need to arrange any additional treatment based on your swab results.  If you have any changing or worsening symptoms you should be seen immediately.     ED Prescriptions     Medication Sig Dispense Auth. Provider   fluconazole (DIFLUCAN) 150 MG tablet Take 1 tablet (150 mg total) by mouth every 3 (three) days. May take second pill in 3 days if no resolution of symptoms with first pill 2 tablet Emonte Dieujuste K, PA-C   ibuprofen (ADVIL) 800 MG tablet Take 1 tablet (800 mg total) by mouth 3 (three) times daily. 21 tablet Goodwin Kamphaus K, PA-C   clotrimazole (GYNE-LOTRIMIN) 1 % vaginal cream  (Status: Discontinued) Place 1 Applicatorful vaginally at bedtime. 45 g Deshia Vanderhoof K, PA-C   terconazole (TERAZOL 7) 0.4 % vaginal cream Place 1 applicator vaginally at bedtime. 45 g Usiel Astarita  K, PA-C      PDMP not reviewed this encounter.   Terrilee Croak, PA-C 08/04/22 1242

## 2022-08-04 NOTE — ED Triage Notes (Signed)
Pt c/o possible yeast infection and UTI. States she is having frequency, lower back and pelvic pain, urinary pressure, onset ~ Friday   Also asking for refill of nystatin

## 2022-08-05 LAB — CERVICOVAGINAL ANCILLARY ONLY
Bacterial Vaginitis (gardnerella): NEGATIVE
Candida Glabrata: NEGATIVE
Candida Vaginitis: POSITIVE — AB
Chlamydia: NEGATIVE
Comment: NEGATIVE
Comment: NEGATIVE
Comment: NEGATIVE
Comment: NEGATIVE
Comment: NEGATIVE
Comment: NORMAL
Neisseria Gonorrhea: NEGATIVE
Trichomonas: NEGATIVE

## 2022-08-30 ENCOUNTER — Ambulatory Visit (INDEPENDENT_AMBULATORY_CARE_PROVIDER_SITE_OTHER): Payer: PRIVATE HEALTH INSURANCE | Admitting: Family

## 2022-08-30 ENCOUNTER — Encounter (HOSPITAL_BASED_OUTPATIENT_CLINIC_OR_DEPARTMENT_OTHER): Payer: Self-pay | Admitting: Family

## 2022-08-30 VITALS — BP 136/78 | HR 100 | Ht 66.0 in | Wt 249.3 lb

## 2022-08-30 DIAGNOSIS — I25118 Atherosclerotic heart disease of native coronary artery with other forms of angina pectoris: Secondary | ICD-10-CM | POA: Diagnosis not present

## 2022-08-30 DIAGNOSIS — I255 Ischemic cardiomyopathy: Secondary | ICD-10-CM

## 2022-08-30 DIAGNOSIS — I10 Essential (primary) hypertension: Secondary | ICD-10-CM

## 2022-08-30 DIAGNOSIS — E785 Hyperlipidemia, unspecified: Secondary | ICD-10-CM | POA: Diagnosis not present

## 2022-08-30 DIAGNOSIS — F418 Other specified anxiety disorders: Secondary | ICD-10-CM

## 2022-08-30 MED ORDER — LOSARTAN POTASSIUM 50 MG PO TABS: 50.0000 mg | ORAL_TABLET | Freq: Every day | ORAL | 1 refills | Status: DC

## 2022-08-30 MED ORDER — HYDROXYZINE HCL 10 MG PO TABS: 10.0000 mg | ORAL_TABLET | Freq: Two times a day (BID) | ORAL | 0 refills | Status: AC | PRN

## 2022-08-30 NOTE — Progress Notes (Signed)
Office Visit    Patient Name: Erica Nguyen Date of Encounter: 08/30/2022  PCP:  Fulbright, Virginia E, [place]  Cardiologist:  Kirk Ruths, MD  Advanced Practice Provider:  No care team member to display Electrophysiologist:  None   Chief Complaint    Erica Nguyen is a 51 y.o. female with a hx of CAD s/p NSTEMI 06/2017 with PCI/DESx2 to RCA and LAD, ischemic cardiomyopathy, hypertension, hyperlipidemia presents today for chest tightness   Past Medical History    Past Medical History:  Diagnosis Date   Abnormal Pap smear    Diabetes mellitus    insulin-dependent type 2   Family history of adverse reaction to anesthesia    sister has n/v   Fibroid    HNP (herniated nucleus pulposus)    Hypertension    chronic hypertension   Pregnancy induced hypertension    Past Surgical History:  Procedure Laterality Date   BACK SURGERY     CARDIAC CATHETERIZATION  02/29/2008   Normal coronary arteries -- Normal LV (left ventricular) systolic function -- Mild to moderate elevation in left ventricular end-diastolic  pressure secondary to hypertension, diabetes and obesity   CHOLECYSTECTOMY  1995   COLPOSCOPY     CORONARY STENT INTERVENTION N/A 07/21/2017   Procedure: Coronary Stent Intervention;  Surgeon: [name], MD;  Location: [facility];  Service: Cardiovascular;  Laterality: N/A;   DECOMPRESSIVE LUMBAR LAMINECTOMY LEVEL 1 Right 11/22/2015   Procedure: DECOMPRESSIVE LUMBAR LAMINECTOMY L5-S1 ON RIGHT,FORAMINOTOMIES L5,S1 DISCECTOMY L5,S1;  Surgeon: Susa Day, MD;  Location: [facility];  Service: Orthopedics;  Laterality: Right;   GYNECOLOGIC CRYOSURGERY  1998   Hysteroscopy, D&C, Novasure ablation, removal of Intrauterine device  01/08/2008   LEFT HEART CATH AND CORONARY ANGIOGRAPHY N/A 07/21/2017   Procedure: Left Heart Cath and Coronary Angiography;  Surgeon: [name], MD;  Location: [facility];  Service: Cardiovascular;   Laterality: N/A;   LUMBAR LAMINECTOMY/DECOMPRESSION MICRODISCECTOMY Right 10/11/2016   Procedure: REVISION MICRO LUMBAR DECOMPRESSION L5-S1 RIGHT;  Surgeon: Susa Day, MD;  Location: [facility];  Service: Orthopedics;  Laterality: Right;   NOVASURE ABLATION  01/08/2008    Allergies  Allergies  Allergen Reactions   Amoxicillin Other (See Comments)    Yeast Infection with AMOXIL Use Give pt yeast infection Has patient had a PCN reaction causing immediate rash, facial/tongue/throat swelling, SOB or lightheadedness with hypotension: No Has patient had a PCN reaction causing severe rash involving mucus membranes or skin necrosis: No Has patient had a PCN reaction that required hospitalization No Has patient had a PCN reaction occurring within the last 10 years: No If all of the above answers are "NO", then may proceed with Cephalosporin use.  Yeast Infection with AMOXIL Use    History of Present Illness    Erica Nguyen is a 51 y.o. female with a hx of CAD s/p NSTEMI 06/2017 with PCI/DESx2 to RCA and LAD, ischemic cardiomyopathy with normalization of LVEF, hypertension, hyperlipidemia last seen 10/2021.  She was admitted July 2018 with NSTEMI with troponins peaking at 0.79.  Cardiac catheterization with LVEF 35-45%, 75% RCA, 75% distal LAD.  She received PCI with DES X2 to RCA and LAD.  Echocardiogram December 2018 normal LVEF, grade 2 diastolic dysfunction.  Nuclear stress test January 2020 LVEF 54%, breast attenuation but no ischemia.  She was seen 02/01/2020 by Dr. Stanford Breed and dyspnea with moderate physical activity, occasional brief pain in her chest lasting  only 1 to 2 seconds but no significant exertional dyspnea nor syncope.  No changes were made at that time.  She was seen September 2021 by Coletta Memos, NP noting brief intermittent episodes of chest discomfort which last 3 to 4 minutes and resolve with rest.  She was walking 3 flights of stairs daily without difficulty.  She did  note intermittent lower extremity edema which was attributed to salt use and compression stockings were recommended.  Was seen 05/18/21. Due to hypotension, Lisinopril dose was decreased to '20mg'$  daily. She noted occasional chest discomfort which was attributed to stress as her 61 year old son had recently been shot by a drive by shooter but was thankfully recovering well. She was recommended to follow up in 4-6 weeks which was not completed.  Seen 11/06/2021.  Due to chest discomfort Lexiscan Myoview ordered and performed 11/23/2021 which was low risk study with no evidence of ischemia or infarction with an EF 64%.  Presents today for follow-up independently.  Notes recent stressor is her 7 year old son has been getting into trouble. Notes chest discomfort associated with shortness of breath when she is worried about him. No exertional chest discomfort nor dyspnea. Follows routinely with therapy.  Not checking BP routinely at home. Taking Lisinopril only intermittently as notes headache when she takes it.  No palpitations, orthopnea, PND, edema.   EKGs/Labs/Other Studies Reviewed:   The following studies were reviewed today:  Leane Call 10/2021   The patient reported dyspnea during the stress test. Onset of symptoms occurred at stage 0.5 of the protocol. Symptoms began at minute 30 sec during stress and ended at minute 3 during recovery.   ECG is normal. ECG rhythm shows normal sinus rhythm. Resting ECG shows no ST-segment deviation.   No ST deviation was noted. There were no arrhythmias during stress. There were no arrhythmias during recovery. ECG was interpretable and without significant changes. The ECG was not diagnostic due to pharmacologic protocol.   Breast and diaphragmatic attenuation artifact was present.   LV perfusion is normal. There is no evidence of ischemia. There is no evidence of infarction.   Nuclear stress EF: 64 %. The left ventricular ejection fraction is normal  (55-65%).   Prior study available for comparison from 01/08/2019.   The study is normal. The study is low risk.  EKG:  EKG is  ordered today.  The ekg ordered today demonstrates NSR 100 bpm with no acute ST/T wave changes.   Recent Labs: No results found for requested labs within last 365 days.  Recent Lipid Panel    Component Value Date/Time   CHOL 136 10/11/2020 1159   TRIG 95 10/11/2020 1159   HDL 50 10/11/2020 1159   CHOLHDL 2.7 10/11/2020 1159   LDLCALC 68 10/11/2020 1159    Home Medications   Current Meds  Medication Sig   acetaminophen (TYLENOL) 500 MG tablet Take 1,000 mg by mouth every 6 (six) hours as needed for moderate pain or headache.   aspirin EC 81 MG EC tablet Take 1 tablet (81 mg total) by mouth daily.   ezetimibe (ZETIA) 10 MG tablet Take 1 tablet (10 mg total) by mouth daily.   glucose blood test strip For blood sugar monitoring 3x daily with the One Touch Verio Flex glucometer   hydrOXYzine (ATARAX) 10 MG tablet Take 1 tablet (10 mg total) by mouth 2 (two) times daily as needed for anxiety.   ibuprofen (ADVIL) 800 MG tablet Take 1 tablet (800 mg total) by  mouth 3 (three) times daily.   insulin lispro (HUMALOG) 100 UNIT/ML injection Inject 20 Units into the skin daily before supper.    LANTUS SOLOSTAR 100 UNIT/ML Solostar Pen INJECT 50 UNITS DOSE INTO THE SKIN DAILY.   losartan (COZAAR) 50 MG tablet Take 1 tablet (50 mg total) by mouth daily.   metoprolol succinate (TOPROL-XL) 50 MG 24 hr tablet Take 1 tablet (50 mg total) by mouth 2 (two) times daily. Take with or immediately following a meal.   MOUNJARO 5 MG/0.5ML Pen Inject into the skin.   nitroGLYCERIN (NITROSTAT) 0.4 MG SL tablet Place 1 tablet (0.4 mg total) under the tongue every 5 (five) minutes x 3 doses as needed for chest pain.   rosuvastatin (CRESTOR) 40 MG tablet Take 1 tablet (40 mg total) by mouth daily.     Review of Systems  All other systems reviewed and are otherwise negative except as  noted above.  Physical Exam    VS:  BP 136/78   Pulse 100   Ht '5\' 6"'$  (1.676 m)   Wt 249 lb 4.8 oz (113.1 kg)   BMI 40.24 kg/m  , BMI Body mass index is 40.24 kg/m.  Wt Readings from Last 3 Encounters:  08/30/22 249 lb 4.8 oz (113.1 kg)  11/22/21 234 lb (106.1 kg)  11/06/21 234 lb (106.1 kg)    GEN: Well nourished, well developed, in no acute distress. HEENT: normal. Neck: Supple, no JVD, carotid bruits, or masses. Cardiac: RRR, no murmurs, rubs, or gallops. No clubbing, cyanosis, edema.  Radials/DP/PT 2+ and equal bilaterally.  Respiratory:  Respirations regular and unlabored, clear to auscultation bilaterally. GI: Soft, nontender, nondistended. MS: No deformity or atrophy. Skin: Warm and dry, no rash. Neuro:  Strength and sensation are intact. Psych: Normal affect.  Assessment & Plan    CAD s/p DES to RCA and LAD July 2018- Intermittent chest pain at rest associated with stress. EKG today no acute ST/T wave changes. Myoview 10/2021 low risk study. Low suspicion for ischemia, no indication for ischemic evaluation. Reassurance provided. GDMT Aspirin, Zetia, Metoprolol, Rosuvastatin, PRN nitroglycerin.   Anxiety - Recent anxiety regarding her son who she is raising as a single mother. Discussed stress management, pursed lip breathing - follows with psychology. I do think she would benefit from anxiolytic. Encouraged to discuss with her PCP. Will provide short course 30 tabs Hydroxyzine BID PRN for anxiety. Further refills per PCP.   Ischemic cardiomyopathy with recovered LVEF- Euvolemic and well compensated on exam. GDMT includes metoprolol, lisinopril. No indication for diuretic. Heart healthy diet and regular cardiovascular exercise encouraged.   Hypertension- BP not controlled. Notes headache with Lisinopril, will discontinue. Start Losartan '50mg'$  daily  HLD, LDL goal less than 70- Continue Zetia '10mg'$  daily, Rosuvastatin '40mg'$  daily. Update direct LDL, LFT today.   DM2 -  10/2021 A1c 12.3. Continue to follow with PCP.   Disposition: Follow up in 3 months with Dr. Stanford Breed or APP   Signed, Loel Dubonnet, NP 08/30/2022, 4:26 PM [place]

## 2022-08-30 NOTE — Patient Instructions (Addendum)
Medication Instructions:  Your physician has recommended you make the following change in your medication:   STOP Lisinopril  START Losartan '50mg'$  daily  START Hydroxyzine as needed for anxiety. May take up to twice daily.   *If you need a refill on your cardiac medications before your next appointment, please call your pharmacy*   Lab Work: Your physician recommends that you return for lab work today:direct LDL, CMP  If you have labs (blood work) drawn today and your tests are completely normal, you will receive your results only by: [facility] (if you have MyChart) OR A paper copy in the mail If you have any lab test that is abnormal or we need to change your treatment, we will call you to review the results.   Testing/Procedures: Your EKG today showed normal sinus rhythm which is a great result!   Follow-Up: At [facility], you and your health needs are our priority.  As part of our continuing mission to provide you with exceptional heart care, we have created designated Provider Care Teams.  These Care Teams include your primary Cardiologist (physician) and Advanced Practice Providers (APPs -  Physician Assistants and Nurse Practitioners) who all work together to provide you with the care you need, when you need it.  We recommend signing up for the patient portal called "MyChart".  Sign up information is provided on this After Visit Summary.  MyChart is used to connect with patients for Virtual Visits (Telemedicine).  Patients are able to view lab/test results, encounter notes, upcoming appointments, etc.  Non-urgent messages can be sent to your provider as well.   To learn more about what you can do with MyChart, go to [email or web address].    Your next appointment:   3 month(s)  The format for your next appointment:   In Person  Provider:   Kirk Ruths, MD  or Loel Dubonnet, NP    Other Instructions  Heart Healthy Diet Recommendations: A  low-salt diet is recommended. Meats should be grilled, baked, or boiled. Avoid fried foods. Focus on lean protein sources like fish or chicken with vegetables and fruits. The American Heart Association is a Microbiologist!  American Heart Association Diet and Lifeystyle Recommendations   Exercise recommendations: The American Heart Association recommends 150 minutes of moderate intensity exercise weekly. Try 30 minutes of moderate intensity exercise 4-5 times per week. This could include walking, jogging, or swimming.  Tips to Measure your Blood Pressure Correctly  To determine whether you have hypertension, a medical professional will take a blood pressure reading. How you prepare for the test, the position of your arm, and other factors can change a blood pressure reading by 10% or more. That could be enough to hide high blood pressure, start you on a drug you don't really need, or lead your doctor to incorrectly adjust your medications.  National and international guidelines offer specific instructions for measuring blood pressure. If a doctor, nurse, or medical assistant isn't doing it right, don't hesitate to ask him or her to get with the guidelines.  Here's what you can do to ensure a correct reading:  Don't drink a caffeinated beverage or smoke during the 30 minutes before the test.  Sit quietly for five minutes before the test begins.  During the measurement, sit in a chair with your feet on the floor and your arm supported so your elbow is at about heart level.  The inflatable part of the cuff should completely cover at least 80%  of your upper arm, and the cuff should be placed on bare skin, not over a shirt.  Don't talk during the measurement.  Blood pressure categories  Blood pressure category SYSTOLIC (upper number)  DIASTOLIC (lower number)  Normal Less than 120 mm Hg and Less than 80 mm Hg  Elevated 120-129 mm Hg and Less than 80 mm Hg  High blood pressure: Stage 1  hypertension 130-139 mm Hg or 80-89 mm Hg  High blood pressure: Stage 2 hypertension 140 mm Hg or higher or 90 mm Hg or higher  Hypertensive crisis (consult your doctor immediately) Higher than 180 mm Hg and/or Higher than 120 mm Hg  Source: American Heart Association and American Stroke Association. For more on getting your blood pressure under control, buy Controlling Your Blood Pressure, a Special Health Report from [facility]   Blood Pressure Log   Date   Time  Blood Pressure  Example: Nov 1 9 AM 124/78                                               Important Information About Sugar

## 2022-08-31 LAB — COMPREHENSIVE METABOLIC PANEL
ALT: 18 IU/L (ref 0–32)
AST: 18 IU/L (ref 0–40)
Albumin/Globulin Ratio: 1.5 (ref 1.2–2.2)
Albumin: 4.3 g/dL (ref 3.9–4.9)
Alkaline Phosphatase: 103 IU/L (ref 44–121)
BUN/Creatinine Ratio: 18 (ref 9–23)
BUN: 19 mg/dL (ref 6–24)
Bilirubin Total: 0.3 mg/dL (ref 0.0–1.2)
CO2: 20 mmol/L (ref 20–29)
Calcium: 9.8 mg/dL (ref 8.7–10.2)
Chloride: 101 mmol/L (ref 96–106)
Creatinine, Ser: 1.04 mg/dL — ABNORMAL HIGH (ref 0.57–1.00)
Globulin, Total: 2.9 g/dL (ref 1.5–4.5)
Glucose: 227 mg/dL — ABNORMAL HIGH (ref 70–99)
Potassium: 4.5 mmol/L (ref 3.5–5.2)
Sodium: 140 mmol/L (ref 134–144)
Total Protein: 7.2 g/dL (ref 6.0–8.5)
eGFR: 66 mL/min/{1.73_m2} (ref >59)

## 2022-08-31 LAB — LDL CHOLESTEROL, DIRECT: LDL Direct: 115 mg/dL — ABNORMAL HIGH (ref 0–99)

## 2022-09-02 ENCOUNTER — Telehealth (HOSPITAL_BASED_OUTPATIENT_CLINIC_OR_DEPARTMENT_OTHER): Payer: Self-pay

## 2022-09-02 DIAGNOSIS — E785 Hyperlipidemia, unspecified: Secondary | ICD-10-CM

## 2022-09-02 DIAGNOSIS — I10 Essential (primary) hypertension: Secondary | ICD-10-CM

## 2022-09-02 MED ORDER — EZETIMIBE 10 MG PO TABS: 10.0000 mg | ORAL_TABLET | Freq: Every day | ORAL | 0 refills | Status: DC

## 2022-09-02 MED ORDER — ROSUVASTATIN CALCIUM 40 MG PO TABS: 40.0000 mg | ORAL_TABLET | Freq: Every day | ORAL | 3 refills | Status: DC

## 2022-09-02 NOTE — Addendum Note (Signed)
Addended by: Gerald Stabs on: 09/02/2022 02:30 PM   Modules accepted: Orders

## 2022-09-02 NOTE — Telephone Encounter (Addendum)
Results called to patient who verbalizes understanding!  Patient agreeable to Lipid Clinic referral, labs mailed to patient.     ----- Message from Loel Dubonnet, NP sent at 09/02/2022 12:02 PM EDT ----- Kidney function slightly decreased likely due to dehydration. Ensure staying well hydrated. Normal electrolytes, liver function. LDL (bad cholesterol) of 115. Not at goal of <70. Ensure taking Crestor and Zetia as prescribed.   If taking Crestor/Zetia as prescribed - refer to lipid clinic.  If not, resume and repeat FLP in 2-3 months.  BMP in 2-3 weeks for monitoring of renal function.

## 2022-09-10 ENCOUNTER — Other Ambulatory Visit (HOSPITAL_BASED_OUTPATIENT_CLINIC_OR_DEPARTMENT_OTHER): Payer: Self-pay | Admitting: Family

## 2022-09-10 DIAGNOSIS — F418 Other specified anxiety disorders: Secondary | ICD-10-CM

## 2022-09-17 ENCOUNTER — Other Ambulatory Visit (HOSPITAL_BASED_OUTPATIENT_CLINIC_OR_DEPARTMENT_OTHER): Payer: Self-pay | Admitting: Family Medicine

## 2022-09-17 DIAGNOSIS — Z1231 Encounter for screening mammogram for malignant neoplasm of breast: Secondary | ICD-10-CM

## 2022-09-21 ENCOUNTER — Ambulatory Visit (HOSPITAL_BASED_OUTPATIENT_CLINIC_OR_DEPARTMENT_OTHER): Payer: PRIVATE HEALTH INSURANCE | Admitting: Radiology

## 2022-10-15 ENCOUNTER — Other Ambulatory Visit (HOSPITAL_BASED_OUTPATIENT_CLINIC_OR_DEPARTMENT_OTHER): Payer: Self-pay

## 2022-10-15 MED ORDER — INFLUENZA VAC SPLIT QUAD 0.5 ML IM SUSY
PREFILLED_SYRINGE | INTRAMUSCULAR | 0 refills | Status: AC
  Filled 2022-10-15: qty 0.5, 1d supply, fill #0

## 2022-12-24 NOTE — Progress Notes (Deleted)
HPI: FU CAD. Admitted 7/18 with NSTEMI (peak troponin 0.79). Cared for by Dr Terrence Dupont. Cath revealed EF 35-45; 75 RCA, 75 distal LAD; pt had PCI of RCA and LAD with DES.   Echocardiogram December 2018 showed normal LV systolic function, grade 2 diastolic dysfunction.  Nuclear study December 2022 showed ejection fraction 64% and no ischemia or infarction.  Since last seen   Current Outpatient Medications  Medication Sig Dispense Refill   acetaminophen (TYLENOL) 500 MG tablet Take 1,000 mg by mouth every 6 (six) hours as needed for moderate pain or headache.     aspirin EC 81 MG EC tablet Take 1 tablet (81 mg total) by mouth daily. 30 tablet 3   ezetimibe (ZETIA) 10 MG tablet Take 1 tablet (10 mg total) by mouth daily. 30 tablet 0   glucose blood test strip For blood sugar monitoring 3x daily with the One Touch Verio Flex glucometer     hydrOXYzine (ATARAX) 10 MG tablet Take 1 tablet (10 mg total) by mouth 2 (two) times daily as needed for anxiety. 30 tablet 0   ibuprofen (ADVIL) 800 MG tablet Take 1 tablet (800 mg total) by mouth 3 (three) times daily. 21 tablet 0   influenza vac split quadrivalent PF (FLUARIX) 0.5 ML injection Inject into the muscle. 0.5 mL 0   insulin lispro (HUMALOG) 100 UNIT/ML injection Inject 20 Units into the skin daily before supper.      LANTUS SOLOSTAR 100 UNIT/ML Solostar Pen INJECT 50 UNITS DOSE INTO THE SKIN DAILY.     losartan (COZAAR) 50 MG tablet Take 1 tablet (50 mg total) by mouth daily. 90 tablet 1   metoprolol succinate (TOPROL-XL) 50 MG 24 hr tablet Take 1 tablet (50 mg total) by mouth 2 (two) times daily. Take with or immediately following a meal. 180 tablet 3   MOUNJARO 5 MG/0.5ML Pen Inject into the skin.     nitroGLYCERIN (NITROSTAT) 0.4 MG SL tablet Place 1 tablet (0.4 mg total) under the tongue every 5 (five) minutes x 3 doses as needed for chest pain. 25 tablet 12   rosuvastatin (CRESTOR) 40 MG tablet Take 1 tablet (40 mg total) by mouth daily. 90  tablet 3   No current facility-administered medications for this visit.   Facility-Administered Medications Ordered in Other Visits  Medication Dose Route Frequency Provider Last Rate Last Admin   technetium tetrofosmin (TC-MYOVIEW) injection [id number] millicurie  [id number] millicurie Intravenous Once PRN Sueanne Margarita, MD         Past Medical History:  Diagnosis Date   Abnormal Pap smear    Diabetes mellitus    insulin-dependent type 2   Family history of adverse reaction to anesthesia    sister has n/v   Fibroid    HNP (herniated nucleus pulposus)    Hypertension    chronic hypertension   Pregnancy induced hypertension     Past Surgical History:  Procedure Laterality Date   BACK SURGERY     CARDIAC CATHETERIZATION  02/29/2008   Normal coronary arteries -- Normal LV (left ventricular) systolic function -- Mild to moderate elevation in left ventricular end-diastolic  pressure secondary to hypertension, diabetes and obesity   CHOLECYSTECTOMY  1995   COLPOSCOPY     CORONARY STENT INTERVENTION N/A 07/21/2017   Procedure: Coronary Stent Intervention;  Surgeon: [name], MD;  Location: [facility];  Service: Cardiovascular;  Laterality: N/A;   DECOMPRESSIVE LUMBAR LAMINECTOMY LEVEL 1 Right 11/22/2015   Procedure:  DECOMPRESSIVE LUMBAR LAMINECTOMY L5-S1 ON RIGHT,FORAMINOTOMIES L5,S1 DISCECTOMY L5,S1;  Surgeon: Susa Day, MD;  Location: [facility];  Service: Orthopedics;  Laterality: Right;   GYNECOLOGIC CRYOSURGERY  1998   Hysteroscopy, D&C, Novasure ablation, removal of Intrauterine device  01/08/2008   LEFT HEART CATH AND CORONARY ANGIOGRAPHY N/A 07/21/2017   Procedure: Left Heart Cath and Coronary Angiography;  Surgeon: [name], MD;  Location: [facility];  Service: Cardiovascular;  Laterality: N/A;   LUMBAR LAMINECTOMY/DECOMPRESSION MICRODISCECTOMY Right 10/11/2016   Procedure: REVISION MICRO LUMBAR DECOMPRESSION L5-S1 RIGHT;  Surgeon: Susa Day, MD;   Location: [facility];  Service: Orthopedics;  Laterality: Right;   NOVASURE ABLATION  01/08/2008    Social History   Socioeconomic History   Marital status: Single    Spouse name: Not on file   Number of children: 1   Years of education: Not on file   Highest education level: Not on file  Occupational History   Occupation: [employer], Medical illustrator  Tobacco Use   Smoking status: Never   Smokeless tobacco: Never  Vaping Use   Vaping Use: Never used  Substance and Sexual Activity   Alcohol use: No   Drug use: No   Sexual activity: Not Currently    Birth control/protection: Surgical    Comment: ablation  Other Topics Concern   Not on file  Social History Narrative   Not on file   Social Determinants of Health   Financial Resource Strain: Not on file  Food Insecurity: Not on file  Transportation Needs: Not on file  Physical Activity: Not on file  Stress: Not on file  Social Connections: Not on file  Intimate Partner Violence: Not on file    Family History  Problem Relation Age of Onset   Heart disease Mother    Diabetes Mother    Hypertension Father    Diabetes Father    Kidney failure Father    Stroke Father    Hypertension Paternal Grandmother    Diabetes Paternal Grandmother    Stroke Paternal Grandmother    Diabetes Maternal Grandmother    Ovarian cancer Maternal Grandmother    Diabetes Paternal Grandfather     ROS: no fevers or chills, productive cough, hemoptysis, dysphasia, odynophagia, melena, hematochezia, dysuria, hematuria, rash, seizure activity, orthopnea, PND, pedal edema, claudication. Remaining systems are negative.  Physical Exam: Well-developed well-nourished in no acute distress.  Skin is warm and dry.  HEENT is normal.  Neck is supple.  Chest is clear to auscultation with normal expansion.  Cardiovascular exam is regular rate and rhythm.  Abdominal exam nontender or distended. No masses palpated. Extremities show no edema. neuro grossly  intact  ECG- personally reviewed  A/P  1 coronary artery disease-patient denies chest pain.  Most recent nuclear study showed no ischemia.  Continue aspirin and statin.  2 ischemic cardiomyopathy-LV function has improved on most recent echocardiogram.  Continue present medical regimen including ACE inhibitor and beta-blocker.  Repeat echocardiogram to reassess LV function.  3 hypertension-blood pressure controlled.  Continue present medications and follow.  4 hyperlipidemia-continue Crestor and Zetia.  5 obesity-we discussed the importance of diet, exercise and weight loss.  Kirk Ruths, MD

## 2022-12-25 ENCOUNTER — Other Ambulatory Visit (HOSPITAL_BASED_OUTPATIENT_CLINIC_OR_DEPARTMENT_OTHER): Payer: Self-pay | Admitting: Family

## 2022-12-25 DIAGNOSIS — E785 Hyperlipidemia, unspecified: Secondary | ICD-10-CM

## 2022-12-25 NOTE — Telephone Encounter (Signed)
Rx(s) sent to pharmacy electronically.  

## 2023-01-07 ENCOUNTER — Ambulatory Visit: Payer: PRIVATE HEALTH INSURANCE | Attending: Cardiology | Admitting: Cardiology

## 2023-01-17 ENCOUNTER — Ambulatory Visit (HOSPITAL_BASED_OUTPATIENT_CLINIC_OR_DEPARTMENT_OTHER): Payer: PRIVATE HEALTH INSURANCE | Admitting: Orthopaedic Surgery

## 2023-02-07 ENCOUNTER — Other Ambulatory Visit: Payer: Self-pay | Admitting: Cardiology

## 2023-02-07 ENCOUNTER — Ambulatory Visit (HOSPITAL_BASED_OUTPATIENT_CLINIC_OR_DEPARTMENT_OTHER)
Admission: RE | Admit: 2023-02-07 | Discharge: 2023-02-07 | Disposition: A | Discharge status: Home / Self Care | Payer: PRIVATE HEALTH INSURANCE | Source: Ambulatory Visit | Attending: Family Medicine | Admitting: Family Medicine

## 2023-02-07 ENCOUNTER — Telehealth: Payer: Self-pay | Admitting: Cardiology

## 2023-02-07 ENCOUNTER — Other Ambulatory Visit (HOSPITAL_BASED_OUTPATIENT_CLINIC_OR_DEPARTMENT_OTHER): Payer: Self-pay | Admitting: Family

## 2023-02-07 DIAGNOSIS — I10 Essential (primary) hypertension: Secondary | ICD-10-CM

## 2023-02-07 DIAGNOSIS — Z1231 Encounter for screening mammogram for malignant neoplasm of breast: Secondary | ICD-10-CM | POA: Diagnosis present

## 2023-02-07 DIAGNOSIS — I255 Ischemic cardiomyopathy: Secondary | ICD-10-CM

## 2023-02-07 DIAGNOSIS — I2510 Atherosclerotic heart disease of native coronary artery without angina pectoris: Secondary | ICD-10-CM

## 2023-02-07 NOTE — Telephone Encounter (Signed)
Spoke with Angie from [employer] at [place] who voiced concern over what the patient understands about her medications (losartan and Toprol). Reviewed medications with pat. She is taking losartan 35m daily and Toprol 510mdaily. Explained that she is to take Toprol 503mwice daily. She verbalized understanding. She was driving, so couldn't get BP. She stated BP has been good at PCP. Called Angie back and updated her.

## 2023-02-07 NOTE — Telephone Encounter (Signed)
Erica Nguyen from the [employer] [facility] at [facility] is wanting to make the office aware that the patient may need to be contacted to go over med list to ensure she's taking the right medications.

## 2023-02-13 ENCOUNTER — Ambulatory Visit (HOSPITAL_BASED_OUTPATIENT_CLINIC_OR_DEPARTMENT_OTHER): Payer: PRIVATE HEALTH INSURANCE

## 2023-02-13 ENCOUNTER — Other Ambulatory Visit (HOSPITAL_BASED_OUTPATIENT_CLINIC_OR_DEPARTMENT_OTHER): Payer: Self-pay

## 2023-02-13 ENCOUNTER — Ambulatory Visit (HOSPITAL_BASED_OUTPATIENT_CLINIC_OR_DEPARTMENT_OTHER): Payer: PRIVATE HEALTH INSURANCE | Admitting: Orthopaedic Surgery

## 2023-02-13 DIAGNOSIS — M7501 Adhesive capsulitis of right shoulder: Secondary | ICD-10-CM

## 2023-02-13 DIAGNOSIS — M25511 Pain in right shoulder: Secondary | ICD-10-CM | POA: Diagnosis not present

## 2023-02-13 DIAGNOSIS — G8929 Other chronic pain: Secondary | ICD-10-CM

## 2023-02-13 MED ORDER — TRIAMCINOLONE ACETONIDE 40 MG/ML IJ SUSP
80.0000 mg | INTRAMUSCULAR | Status: AC | PRN
  Administered 2023-02-13: 80 mg via INTRA_ARTICULAR

## 2023-02-13 MED ORDER — LIDOCAINE HCL 1 % IJ SOLN
4.0000 mL | INTRAMUSCULAR | Status: AC | PRN
  Administered 2023-02-13: 4 mL

## 2023-02-13 MED ORDER — IBUPROFEN 800 MG PO TABS
800.0000 mg | ORAL_TABLET | Freq: Three times a day (TID) | ORAL | 0 refills | Status: AC
  Filled 2023-02-13: qty 30, 10d supply, fill #0

## 2023-02-13 NOTE — Progress Notes (Signed)
Chief Complaint: Right shoulder pain     History of Present Illness:    Erica Nguyen is a 52 y.o. female presents today with several weeks of ongoing right shoulder pain.  This is worse over the last several days.  She is having extremely difficult time laying on the side.  She is endorsing decreased range of motion with increased pain.  She has been having a difficult time going behind back.  She is now able to lay on the side.  She does have a history of diabetes mellitus with an A1c most recently of 10.    Surgical History:   none  PMH/PSH/Family History/Social History/Meds/Allergies:    Past Medical History:  Diagnosis Date  . Abnormal Pap smear   . Diabetes mellitus    insulin-dependent type 2  . Family history of adverse reaction to anesthesia    sister has n/v  . Fibroid   . HNP (herniated nucleus pulposus)   . Hypertension    chronic hypertension  . Pregnancy induced hypertension    Past Surgical History:  Procedure Laterality Date  . BACK SURGERY    . CARDIAC CATHETERIZATION  02/29/2008   Normal coronary arteries -- Normal LV (left ventricular) systolic function -- Mild to moderate elevation in left ventricular end-diastolic  pressure secondary to hypertension, diabetes and obesity  . CHOLECYSTECTOMY  1995  . COLPOSCOPY    . CORONARY STENT INTERVENTION N/A 07/21/2017   Procedure: Coronary Stent Intervention;  Surgeon: [name], MD;  Location: [facility];  Service: Cardiovascular;  Laterality: N/A;  . DECOMPRESSIVE LUMBAR LAMINECTOMY LEVEL 1 Right 11/22/2015   Procedure: DECOMPRESSIVE LUMBAR LAMINECTOMY L5-S1 ON RIGHT,FORAMINOTOMIES L5,S1 DISCECTOMY L5,S1;  Surgeon: Susa Day, MD;  Location: [facility];  Service: Orthopedics;  Laterality: Right;  . GYNECOLOGIC CRYOSURGERY  1998  . Hysteroscopy, D&C, Novasure ablation, removal of Intrauterine device  01/08/2008  . LEFT HEART CATH AND CORONARY ANGIOGRAPHY N/A 07/21/2017    Procedure: Left Heart Cath and Coronary Angiography;  Surgeon: [name], MD;  Location: [facility];  Service: Cardiovascular;  Laterality: N/A;  . LUMBAR LAMINECTOMY/DECOMPRESSION MICRODISCECTOMY Right 10/11/2016   Procedure: REVISION MICRO LUMBAR DECOMPRESSION L5-S1 RIGHT;  Surgeon: Susa Day, MD;  Location: [facility];  Service: Orthopedics;  Laterality: Right;  . [place]  01/08/2008   Social History   Socioeconomic History  . Marital status: Single    Spouse name: Not on file  . Number of children: 1  . Years of education: Not on file  . Highest education level: Not on file  Occupational History  . Occupation: [employer], consierge  Tobacco Use  . Smoking status: Never  . Smokeless tobacco: Never  Vaping Use  . Vaping Use: Never used  Substance and Sexual Activity  . Alcohol use: No  . Drug use: No  . Sexual activity: Not Currently    Birth control/protection: Surgical    Comment: ablation  Other Topics Concern  . Not on file  Social History Narrative  . Not on file   Social Determinants of Health   Financial Resource Strain: Not on file  Food Insecurity: Not on file  Transportation Needs: Not on file  Physical Activity: Not on file  Stress: Not on file  Social Connections: Not on file   Family History  Problem  Relation Age of Onset  . Heart disease Mother   . Diabetes Mother   . Hypertension Father   . Diabetes Father   . Kidney failure Father   . Stroke Father   . Hypertension Paternal Grandmother   . Diabetes Paternal Grandmother   . Stroke Paternal Grandmother   . Diabetes Maternal Grandmother   . Ovarian cancer Maternal Grandmother   . Diabetes Paternal Grandfather    Allergies  Allergen Reactions  . Amoxicillin Other (See Comments)    Yeast Infection with AMOXIL Use Give pt yeast infection Has patient had a PCN reaction causing immediate rash, facial/tongue/throat swelling, SOB or lightheadedness with hypotension: No Has  patient had a PCN reaction causing severe rash involving mucus membranes or skin necrosis: No Has patient had a PCN reaction that required hospitalization No Has patient had a PCN reaction occurring within the last 10 years: No If all of the above answers are "NO", then may proceed with Cephalosporin use.  Yeast Infection with AMOXIL Use   Current Outpatient Medications  Medication Sig Dispense Refill  . ibuprofen (ADVIL) 800 MG tablet Take 1 tablet (800 mg total) by mouth every 8 (eight) hours for 10 days. Please take with food, please alternate with acetaminophen 30 tablet 0  . acetaminophen (TYLENOL) 500 MG tablet Take 1,000 mg by mouth every 6 (six) hours as needed for moderate pain or headache.    Marland Kitchen aspirin EC 81 MG EC tablet Take 1 tablet (81 mg total) by mouth daily. 30 tablet 3  . ezetimibe (ZETIA) 10 MG tablet Take 1 tablet (10 mg total) by mouth daily. 90 tablet 2  . glucose blood test strip For blood sugar monitoring 3x daily with the One Touch Verio Flex glucometer    . hydrOXYzine (ATARAX) 10 MG tablet Take 1 tablet (10 mg total) by mouth 2 (two) times daily as needed for anxiety. 30 tablet 0  . ibuprofen (ADVIL) 800 MG tablet Take 1 tablet (800 mg total) by mouth 3 (three) times daily. 21 tablet 0  . influenza vac split quadrivalent PF (FLUARIX) 0.5 ML injection Inject into the muscle. 0.5 mL 0  . insulin lispro (HUMALOG) 100 UNIT/ML injection Inject 20 Units into the skin daily before supper.     Marland Kitchen LANTUS SOLOSTAR 100 UNIT/ML Solostar Pen INJECT 50 UNITS DOSE INTO THE SKIN DAILY.    Marland Kitchen losartan (COZAAR) 50 MG tablet Take 1 tablet (50 mg total) by mouth daily. 90 tablet 1  . metoprolol succinate (TOPROL-XL) 50 MG 24 hr tablet Take 1 tablet (50 mg total) by mouth 2 (two) times daily. Take with or immediately following a meal. 180 tablet 3  . MOUNJARO 5 MG/0.5ML Pen Inject into the skin.    Marland Kitchen nitroGLYCERIN (NITROSTAT) 0.4 MG SL tablet Place 1 tablet (0.4 mg total) under the tongue  every 5 (five) minutes x 3 doses as needed for chest pain. 25 tablet 12  . rosuvastatin (CRESTOR) 40 MG tablet Take 1 tablet (40 mg total) by mouth daily. 90 tablet 3   No current facility-administered medications for this visit.   Facility-Administered Medications Ordered in Other Visits  Medication Dose Route Frequency Provider Last Rate Last Admin  . technetium tetrofosmin (TC-MYOVIEW) injection [id number] millicurie  [id number] millicurie Intravenous Once PRN Sueanne Margarita, MD       No results found.  Review of Systems:   A ROS was performed including pertinent positives and negatives as documented in the HPI.  Physical Exam :  Constitutional: NAD and appears stated age Neurological: Alert and oriented Psych: Appropriate affect and cooperative There were no vitals taken for this visit.   Comprehensive Musculoskeletal Exam:    There is tenderness about the glenohumeral joint.  Forward elevation is 250 compared to 170 on the contralateral side.  External rotation is to 50 compared to 70 on the left side internal rotation is to side.  Imaging:   Xray (3 views right shoulder): Normal   I personally reviewed and interpreted the radiographs.   Assessment:   52 y.o. female [place] female with right shoulder pain consistent with adhesive capsulitis.  At today's visit I recommended initial ultrasound-guided injection.  She will perform blood sugar checks additionally today to ensure this does not elevate her blood sugars.  I have advised her on gentle passive and active range of motion and wall climbs.  I will see her back in 2 weeks for reassessment  Plan :    - Right glenohumeral ultrasound-guided injection performed after verbal consent obtained    Procedure Note  Patient: Erica Nguyen             Date of Birth: Apr 19, 1971           MRN: [id number]             Visit Date: 02/13/2023  Procedures: Visit Diagnoses:  1. Chronic right shoulder pain     Large Joint Inj: R  glenohumeral on 02/13/2023 11:44 AM Indications: pain Details: 22 G 1.5 in needle, ultrasound-guided  Arthrogram: No  Medications: 4 mL lidocaine 1 %; 80 mg triamcinolone acetonide 40 MG/ML Outcome: tolerated well, no immediate complications Procedure, treatment alternatives, risks and benefits explained, specific risks discussed. Consent was given by the patient. Immediately prior to procedure a time out was called to verify the correct patient, procedure, equipment, support staff and site/side marked as required. Patient was prepped and draped in the usual sterile fashion.          I personally saw and evaluated the patient, and participated in the management and treatment plan.  Vanetta Mulders, MD Attending Physician, Orthopedic Surgery  This document was dictated using Dragon voice recognition software. A reasonable attempt at proof reading has been made to minimize errors.

## 2023-02-27 ENCOUNTER — Ambulatory Visit (HOSPITAL_BASED_OUTPATIENT_CLINIC_OR_DEPARTMENT_OTHER): Payer: PRIVATE HEALTH INSURANCE | Admitting: Orthopaedic Surgery

## 2023-02-27 DIAGNOSIS — M7501 Adhesive capsulitis of right shoulder: Secondary | ICD-10-CM

## 2023-02-27 DIAGNOSIS — M25511 Pain in right shoulder: Secondary | ICD-10-CM | POA: Diagnosis not present

## 2023-02-27 MED ORDER — LIDOCAINE HCL 1 % IJ SOLN
4.0000 mL | INTRAMUSCULAR | Status: AC | PRN
  Administered 2023-02-27: 4 mL

## 2023-02-27 MED ORDER — TRIAMCINOLONE ACETONIDE 40 MG/ML IJ SUSP
80.0000 mg | INTRAMUSCULAR | Status: AC | PRN
  Administered 2023-02-27: 80 mg via INTRA_ARTICULAR

## 2023-02-27 NOTE — Progress Notes (Signed)
Chief Complaint: Right shoulder pain     History of Present Illness:   02/27/2023: Presents today for follow-up of her right shoulder.  Overall she feels 90% better after injection.  Range of motion is improving.  She is here today for discussion of the possible injection.   Erica Nguyen is a 52 y.o. female presents today with several weeks of ongoing right shoulder pain.  This is worse over the last several days.  She is having extremely difficult time laying on the side.  She is endorsing decreased range of motion with increased pain.  She has been having a difficult time going behind back.  She is now able to lay on the side.  She does have a history of diabetes mellitus with an A1c most recently of 10.    Surgical History:   none  PMH/PSH/Family History/Social History/Meds/Allergies:    Past Medical History:  Diagnosis Date   Abnormal Pap smear    Diabetes mellitus    insulin-dependent type 2   Family history of adverse reaction to anesthesia    sister has n/v   Fibroid    HNP (herniated nucleus pulposus)    Hypertension    chronic hypertension   Pregnancy induced hypertension    Past Surgical History:  Procedure Laterality Date   BACK SURGERY     CARDIAC CATHETERIZATION  02/29/2008   Normal coronary arteries -- Normal LV (left ventricular) systolic function -- Mild to moderate elevation in left ventricular end-diastolic  pressure secondary to hypertension, diabetes and obesity   CHOLECYSTECTOMY  1995   COLPOSCOPY     CORONARY STENT INTERVENTION N/A 07/21/2017   Procedure: Coronary Stent Intervention;  Surgeon: [name], MD;  Location: [facility];  Service: Cardiovascular;  Laterality: N/A;   DECOMPRESSIVE LUMBAR LAMINECTOMY LEVEL 1 Right 11/22/2015   Procedure: DECOMPRESSIVE LUMBAR LAMINECTOMY L5-S1 ON RIGHT,FORAMINOTOMIES L5,S1 DISCECTOMY L5,S1;  Surgeon: Susa Day, MD;  Location: [facility];  Service: Orthopedics;   Laterality: Right;   GYNECOLOGIC CRYOSURGERY  1998   Hysteroscopy, D&C, Novasure ablation, removal of Intrauterine device  01/08/2008   LEFT HEART CATH AND CORONARY ANGIOGRAPHY N/A 07/21/2017   Procedure: Left Heart Cath and Coronary Angiography;  Surgeon: [name], MD;  Location: [facility];  Service: Cardiovascular;  Laterality: N/A;   LUMBAR LAMINECTOMY/DECOMPRESSION MICRODISCECTOMY Right 10/11/2016   Procedure: REVISION MICRO LUMBAR DECOMPRESSION L5-S1 RIGHT;  Surgeon: Susa Day, MD;  Location: [facility];  Service: Orthopedics;  Laterality: Right;   NOVASURE ABLATION  01/08/2008   Social History   Socioeconomic History   Marital status: Single    Spouse name: Not on file   Number of children: 1   Years of education: Not on file   Highest education level: Not on file  Occupational History   Occupation: [employer], consierge  Tobacco Use   Smoking status: Never   Smokeless tobacco: Never  Vaping Use   Vaping Use: Never used  Substance and Sexual Activity   Alcohol use: No   Drug use: No   Sexual activity: Not Currently    Birth control/protection: Surgical    Comment: ablation  Other Topics Concern   Not on file  Social History Narrative   Not on file   Social Determinants of Health   Financial Resource Strain: Not on file  Food Insecurity: Not on file  Transportation Needs: Not on file  Physical Activity: Not on file  Stress: Not on file  Social Connections: Not on file   Family History  Problem Relation Age of Onset   Heart disease Mother    Diabetes Mother    Hypertension Father    Diabetes Father    Kidney failure Father    Stroke Father    Hypertension Paternal Grandmother    Diabetes Paternal Grandmother    Stroke Paternal Grandmother    Diabetes Maternal Grandmother    Ovarian cancer Maternal Grandmother    Diabetes Paternal Grandfather    Allergies  Allergen Reactions   Amoxicillin Other (See Comments)    Yeast Infection with AMOXIL  Use Give pt yeast infection Has patient had a PCN reaction causing immediate rash, facial/tongue/throat swelling, SOB or lightheadedness with hypotension: No Has patient had a PCN reaction causing severe rash involving mucus membranes or skin necrosis: No Has patient had a PCN reaction that required hospitalization No Has patient had a PCN reaction occurring within the last 10 years: No If all of the above answers are "NO", then may proceed with Cephalosporin use.  Yeast Infection with AMOXIL Use   Current Outpatient Medications  Medication Sig Dispense Refill   acetaminophen (TYLENOL) 500 MG tablet Take 1,000 mg by mouth every 6 (six) hours as needed for moderate pain or headache.     aspirin EC 81 MG EC tablet Take 1 tablet (81 mg total) by mouth daily. 30 tablet 3   ezetimibe (ZETIA) 10 MG tablet Take 1 tablet (10 mg total) by mouth daily. 90 tablet 2   glucose blood test strip For blood sugar monitoring 3x daily with the One Touch Verio Flex glucometer     hydrOXYzine (ATARAX) 10 MG tablet Take 1 tablet (10 mg total) by mouth 2 (two) times daily as needed for anxiety. 30 tablet 0   ibuprofen (ADVIL) 800 MG tablet Take 1 tablet (800 mg total) by mouth 3 (three) times daily. 21 tablet 0   influenza vac split quadrivalent PF (FLUARIX) 0.5 ML injection Inject into the muscle. 0.5 mL 0   insulin lispro (HUMALOG) 100 UNIT/ML injection Inject 20 Units into the skin daily before supper.      LANTUS SOLOSTAR 100 UNIT/ML Solostar Pen INJECT 50 UNITS DOSE INTO THE SKIN DAILY.     losartan (COZAAR) 50 MG tablet Take 1 tablet (50 mg total) by mouth daily. 90 tablet 1   metoprolol succinate (TOPROL-XL) 50 MG 24 hr tablet Take 1 tablet (50 mg total) by mouth 2 (two) times daily. Take with or immediately following a meal. 180 tablet 3   MOUNJARO 5 MG/0.5ML Pen Inject into the skin.     nitroGLYCERIN (NITROSTAT) 0.4 MG SL tablet Place 1 tablet (0.4 mg total) under the tongue every 5 (five) minutes x 3  doses as needed for chest pain. 25 tablet 12   rosuvastatin (CRESTOR) 40 MG tablet Take 1 tablet (40 mg total) by mouth daily. 90 tablet 3   No current facility-administered medications for this visit.   Facility-Administered Medications Ordered in Other Visits  Medication Dose Route Frequency Provider Last Rate Last Admin   technetium tetrofosmin (TC-MYOVIEW) injection [id number] millicurie  [id number] millicurie Intravenous Once PRN Turner, Eber Hong, MD       No results found.  Review of Systems:   A ROS was performed including pertinent positives and negatives as documented in the HPI.  Physical Exam :  Constitutional: NAD and appears stated age Neurological: Alert and oriented Psych: Appropriate affect and cooperative There were no vitals taken for this visit.   Comprehensive Musculoskeletal Exam:    There is tenderness about the glenohumeral joint.  Forward elevation is 165 compared to 170 on the contralateral side.  External rotation is to 65 compared to 70 on the left side internal rotation is to L5  Imaging:   Xray (3 views right shoulder): Normal   I personally reviewed and interpreted the radiographs.   Assessment:   52 y.o. female [place] female with right shoulder pain consistent with adhesive capsulitis.  At today's visit I did recommend an additional ultrasound-guided injection.  I showed her a series of exercises that she can work to pull the arm behind the back.  I will plan to see her back on an as-needed basis in 1 month should she not get 98% relief from this injection  Plan :    - Right glenohumeral ultrasound-guided injection performed after verbal consent obtained    Procedure Note  Patient: Erica Nguyen             Date of Birth: [birth year]           MRN: [id number]             Visit Date: 02/27/2023  Procedures: Visit Diagnoses:  No diagnosis found.   Large Joint Inj: R glenohumeral on 02/27/2023 10:34 AM Indications: pain Details: 22 G 1.5 in  needle, ultrasound-guided anterior approach  Arthrogram: No  Medications: 4 mL lidocaine 1 %; 80 mg triamcinolone acetonide 40 MG/ML Outcome: tolerated well, no immediate complications Procedure, treatment alternatives, risks and benefits explained, specific risks discussed. Consent was given by the patient. Immediately prior to procedure a time out was called to verify the correct patient, procedure, equipment, support staff and site/side marked as required. Patient was prepped and draped in the usual sterile fashion.          I personally saw and evaluated the patient, and participated in the management and treatment plan.  Vanetta Mulders, MD Attending Physician, Orthopedic Surgery  This document was dictated using Dragon voice recognition software. A reasonable attempt at proof reading has been made to minimize errors.

## 2023-02-28 ENCOUNTER — Ambulatory Visit (HOSPITAL_BASED_OUTPATIENT_CLINIC_OR_DEPARTMENT_OTHER): Payer: PRIVATE HEALTH INSURANCE | Admitting: Internal Medicine

## 2023-03-13 ENCOUNTER — Encounter: Payer: Self-pay | Admitting: Internal Medicine

## 2023-03-15 ENCOUNTER — Ambulatory Visit
Admission: EM | Admit: 2023-03-15 | Discharge: 2023-03-15 | Disposition: A | Discharge status: Home / Self Care | Payer: PRIVATE HEALTH INSURANCE | Attending: Internal Medicine | Admitting: Internal Medicine

## 2023-03-15 DIAGNOSIS — Z1152 Encounter for screening for COVID-19: Secondary | ICD-10-CM | POA: Insufficient documentation

## 2023-03-15 DIAGNOSIS — J069 Acute upper respiratory infection, unspecified: Secondary | ICD-10-CM

## 2023-03-15 DIAGNOSIS — J029 Acute pharyngitis, unspecified: Secondary | ICD-10-CM

## 2023-03-15 LAB — POCT RAPID STREP A (OFFICE): Rapid Strep A Screen: NEGATIVE

## 2023-03-15 MED ORDER — ALBUTEROL SULFATE HFA 108 (90 BASE) MCG/ACT IN AERS: 1.0000 | INHALATION_SPRAY | Freq: Four times a day (QID) | RESPIRATORY_TRACT | 0 refills | Status: AC | PRN

## 2023-03-15 MED ORDER — BENZONATATE 100 MG PO CAPS: 100.0000 mg | ORAL_CAPSULE | Freq: Three times a day (TID) | ORAL | 0 refills | Status: AC | PRN

## 2023-03-15 NOTE — ED Provider Notes (Signed)
Erica Nguyen    CSN: [id number] Arrival date & time: 03/15/23  1524      History   Chief Complaint Chief Complaint  Patient presents with   Cough    HPI Erica Nguyen is a 52 y.o. female.   Patient presents with cough, nasal congestion, sore throat,  headache that started yesterday.  Patient reports cough is dry.  She reports that she has a "chest burning sensation" that started today.  She reports some minimal shortness of breath that occurred earlier today that is now resolved.  Denies any fever.  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  Denies history of asthma or COPD.  Patient has taken DayQuil for symptoms with minimal improvement.   Cough   Past Medical History:  Diagnosis Date   Abnormal Pap smear    Diabetes mellitus    insulin-dependent type 2   Family history of adverse reaction to anesthesia    sister has n/v   Fibroid    HNP (herniated nucleus pulposus)    Hypertension    chronic hypertension   Pregnancy induced hypertension     Patient Active Problem List   Diagnosis Date Noted   Abnormal vaginal bleeding 01/02/2021   Abnormal Pap smear of cervix 02/10/2020   NSTEMI (non-ST elevated myocardial infarction) ([place]) 07/31/2017   Acute non Q wave myocardial infarction ([place]) 07/20/2017   Ambulatory dysfunction    Intractable pain    Uncontrolled type 2 diabetes mellitus without complication, with long-term current use of insulin    Sciatica of right side    Hyperglycemia 10/09/2016   Lumbar herniated disc 10/09/2016   Myelopathy ([place]) 10/09/2016   Hypercholesterolemia 01/23/2016   Vitamin D deficiency 01/23/2016   Vaginal candidiasis 11/29/2015   Spinal stenosis of lumbar region 11/22/2015   Obesity, Class II, BMI 35-39.9, with comorbidity 10/15/2015   Lumbosacral radiculopathy due to osteoarthritis of spine 06/10/2015   Fibroids, intramural 06/27/2014   Chest pain 01/06/2012   Dyspnea  01/06/2012   Controlled type 2 diabetes mellitus with hyperglycemia ([place]) 09/25/2007   Essential hypertension 09/25/2007    Past Surgical History:  Procedure Laterality Date   BACK SURGERY     CARDIAC CATHETERIZATION  02/29/2008   Normal coronary arteries -- Normal LV (left ventricular) systolic function -- Mild to moderate elevation in left ventricular end-diastolic  pressure secondary to hypertension, diabetes and obesity   CHOLECYSTECTOMY  1995   COLPOSCOPY     CORONARY STENT INTERVENTION N/A 07/21/2017   Procedure: Coronary Stent Intervention;  Surgeon: [name], MD;  Location: [facility];  Service: Cardiovascular;  Laterality: N/A;   DECOMPRESSIVE LUMBAR LAMINECTOMY LEVEL 1 Right 11/22/2015   Procedure: DECOMPRESSIVE LUMBAR LAMINECTOMY L5-S1 ON RIGHT,FORAMINOTOMIES L5,S1 DISCECTOMY L5,S1;  Surgeon: Susa Day, MD;  Location: [facility];  Service: Orthopedics;  Laterality: Right;   GYNECOLOGIC CRYOSURGERY  1998   Hysteroscopy, D&C, Novasure ablation, removal of Intrauterine device  01/08/2008   LEFT HEART CATH AND CORONARY ANGIOGRAPHY N/A 07/21/2017   Procedure: Left Heart Cath and Coronary Angiography;  Surgeon: [name], MD;  Location: [facility];  Service: Cardiovascular;  Laterality: N/A;   LUMBAR LAMINECTOMY/DECOMPRESSION MICRODISCECTOMY Right 10/11/2016   Procedure: REVISION MICRO LUMBAR DECOMPRESSION L5-S1 RIGHT;  Surgeon: Susa Day, MD;  Location: [facility];  Service: Orthopedics;  Laterality: Right;   NOVASURE ABLATION  01/08/2008    OB History     Gravida  3  Para  1   Term  1   Preterm      AB  2   Living  1      SAB  2   IAB      Ectopic      Multiple      Live Births  1            Home Medications    Prior to Admission medications   Medication Sig Start Date End Date Taking? Authorizing Provider  albuterol (VENTOLIN HFA) 108 (90 Base) MCG/ACT inhaler Inhale 1-2 puffs into the lungs every 6 (six) hours as needed  for wheezing or shortness of breath. 03/15/23  Yes Talibah Colasurdo, [place] E, FNP  benzonatate (TESSALON) 100 MG capsule Take 1 capsule (100 mg total) by mouth every 8 (eight) hours as needed for cough. 03/15/23  Yes Tyianna Menefee, Hildred Alamin E, FNP  acetaminophen (TYLENOL) 500 MG tablet Take 1,000 mg by mouth every 6 (six) hours as needed for moderate pain or headache.    [provider]  aspirin EC 81 MG EC tablet Take 1 tablet (81 mg total) by mouth daily. 07/24/17   [name], MD  ezetimibe (ZETIA) 10 MG tablet Take 1 tablet (10 mg total) by mouth daily. 12/25/22   Loel Dubonnet, NP  glucose blood test strip For blood sugar monitoring 3x daily with the One Touch Verio Flex glucometer 10/22/18   [provider]  hydrOXYzine (ATARAX) 10 MG tablet Take 1 tablet (10 mg total) by mouth 2 (two) times daily as needed for anxiety. 08/30/22   Loel Dubonnet, NP  ibuprofen (ADVIL) 800 MG tablet Take 1 tablet (800 mg total) by mouth 3 (three) times daily. 08/04/22   Raspet, Junie Panning K, PA-C  influenza vac split quadrivalent PF (FLUARIX) 0.5 ML injection Inject into the muscle. 10/15/22   Carlyle Basques, MD  insulin lispro (HUMALOG) 100 UNIT/ML injection Inject 20 Units into the skin daily before supper.     [provider]  LANTUS SOLOSTAR 100 UNIT/ML Solostar Pen INJECT 50 UNITS DOSE INTO THE SKIN DAILY. 03/10/19   [provider]  losartan (COZAAR) 50 MG tablet Take 1 tablet (50 mg total) by mouth daily. 02/07/23 08/06/23  Loel Dubonnet, NP  metoprolol succinate (TOPROL-XL) 50 MG 24 hr tablet Take 1 tablet (50 mg total) by mouth 2 (two) times daily. Take with or immediately following a meal. 02/07/23   Crenshaw, Denice Bors, MD  [facility] 5 MG/0.5ML Pen Inject into the skin. 07/05/22   [provider]  nitroGLYCERIN (NITROSTAT) 0.4 MG SL tablet Place 1 tablet (0.4 mg total) under the tongue every 5 (five) minutes x 3 doses as needed for chest pain. 05/18/21   Loel Dubonnet, NP   rosuvastatin (CRESTOR) 40 MG tablet Take 1 tablet (40 mg total) by mouth daily. 09/02/22   Loel Dubonnet, NP    Family History Family History  Problem Relation Age of Onset   Heart disease Mother    Diabetes Mother    Hypertension Father    Diabetes Father    Kidney failure Father    Stroke Father    Hypertension Paternal Grandmother    Diabetes Paternal Grandmother    Stroke Paternal Grandmother    Diabetes Maternal Grandmother    Ovarian cancer Maternal Grandmother    Diabetes Paternal Grandfather     Social History Social History   Tobacco Use   Smoking status: Never   Smokeless tobacco: Never  Vaping  Use   Vaping Use: Never used  Substance Use Topics   Alcohol use: No   Drug use: No     Allergies   Amoxicillin   Review of Systems Review of Systems Per HPI  Physical Exam Triage Vital Signs ED Triage Vitals [03/15/23 1530]  Enc Vitals Group     BP (!) 150/85     Pulse Rate 100     Resp 18     Temp 98 F (36.7 C)     Temp Source Oral     SpO2 98 %     Weight      Height      Head Circumference      Peak Flow      Pain Score 7     Pain Loc      Pain Edu?      Excl. in [place]?    No data found.  Updated Vital Signs BP (!) 150/85 (BP Location: Left Arm)   Pulse 100   Temp 98 F (36.7 C) (Oral)   Resp 18   SpO2 98%   Visual Acuity Right Eye Distance:   Left Eye Distance:   Bilateral Distance:    Right Eye Near:   Left Eye Near:    Bilateral Near:     Physical Exam Constitutional:      General: She is not in acute distress.    Appearance: Normal appearance. She is not toxic-appearing or diaphoretic.  HENT:     Head: Normocephalic and atraumatic.     Right Ear: Tympanic membrane and ear canal normal.     Left Ear: Tympanic membrane and ear canal normal.     Nose: Congestion present.     Mouth/Throat:     Mouth: Mucous membranes are moist.     Pharynx: Posterior oropharyngeal erythema present.  Eyes:     Extraocular Movements:  Extraocular movements intact.     Conjunctiva/sclera: Conjunctivae normal.     Pupils: Pupils are equal, round, and reactive to light.  Cardiovascular:     Rate and Rhythm: Normal rate and regular rhythm.     Pulses: Normal pulses.     Heart sounds: Normal heart sounds.  Pulmonary:     Effort: Pulmonary effort is normal. No respiratory distress.     Breath sounds: Normal breath sounds. No stridor. No wheezing, rhonchi or rales.  Abdominal:     General: Abdomen is flat. Bowel sounds are normal.     Palpations: Abdomen is soft.  Musculoskeletal:        General: Normal range of motion.     Cervical back: Normal range of motion.  Skin:    General: Skin is warm and dry.  Neurological:     General: No focal deficit present.     Mental Status: She is alert and oriented to person, place, and time. Mental status is at baseline.  Psychiatric:        Mood and Affect: Mood normal.        Behavior: Behavior normal.      UC Treatments / Results  Labs (all labs ordered are listed, but only abnormal results are displayed) Labs Reviewed  CULTURE, GROUP A STREP ([place])  SARS CORONAVIRUS 2 (TAT 6-24 HRS)  POCT RAPID STREP A (OFFICE)    EKG   Radiology No results found.  Procedures Procedures (including critical Nguyen time)  Medications Ordered in UC Medications - No data to display  Initial Impression / Assessment and Plan / UC  Course  I have reviewed the triage vital signs and the nursing notes.  Pertinent labs & imaging results that were available during my Nguyen of the patient were reviewed by me and considered in my medical decision making (see chart for details).     Patient presents with symptoms likely from a viral upper respiratory infection.  Do not suspect underlying cardiopulmonary process. Symptoms seem unlikely related to ACS, CHF or COPD exacerbations, pneumonia, pneumothorax. Patient is nontoxic appearing and not in need of emergent medical intervention.  There are no  adventitious lung sounds on exam or signs of respiratory compromise so do not think that chest imaging is necessary.  Patient's "chest burning" sensation is most likely due to inflammation in chest and there is no concern for cardiac etiology given patient has associated cough.  Will treat with albuterol inhaler and cough medication.  Patient does take metoprolol which may decrease albuterol effectiveness but benefits outweigh risks.  Patient has used albuterol inhaler before and tolerated well.  Rapid strep was negative.  Throat culture and COVID test pending.  Advised following up if symptoms persist or worsen.  Patient verbalized understanding and was agreeable with plan.  Final Clinical Impressions(s) / UC Diagnoses   Final diagnoses:  Viral upper respiratory tract infection with cough  Sore throat     Discharge Instructions      Strep is negative.  Throat culture and COVID test pending.  As we discussed, it appears that you have a viral illness that should run its course and self resolve with the help of symptomatic treatment.  I have prescribed you an albuterol inhaler and cough medication.  Please follow-up if any symptoms persist or worsen.    ED Prescriptions     Medication Sig Dispense Auth. Provider   albuterol (VENTOLIN HFA) 108 (90 Base) MCG/ACT inhaler Inhale 1-2 puffs into the lungs every 6 (six) hours as needed for wheezing or shortness of breath. 1 each [place], [place] E, [place]   benzonatate (TESSALON) 100 MG capsule Take 1 capsule (100 mg total) by mouth every 8 (eight) hours as needed for cough. 21 capsule [place], Michele Rockers, [place]      PDMP not reviewed this encounter.   Teodora Medici, [place] 03/15/23 [phone]

## 2023-03-15 NOTE — ED Triage Notes (Signed)
Pt c/o coughing, headache, sore throat,   Onset ~ yesterday

## 2023-03-15 NOTE — Discharge Instructions (Signed)
Strep is negative.  Throat culture and COVID test pending.  As we discussed, it appears that you have a viral illness that should run its course and self resolve with the help of symptomatic treatment.  I have prescribed you an albuterol inhaler and cough medication.  Please follow-up if any symptoms persist or worsen.

## 2023-03-16 LAB — SARS CORONAVIRUS 2 (TAT 6-24 HRS): SARS Coronavirus 2: NEGATIVE

## 2023-03-18 LAB — CULTURE, GROUP A STREP (THRC)

## 2023-06-08 ENCOUNTER — Encounter: Payer: Self-pay | Admitting: Emergency Medicine

## 2023-06-08 ENCOUNTER — Ambulatory Visit
Admission: EM | Admit: 2023-06-08 | Discharge: 2023-06-08 | Disposition: A | Discharge status: Home / Self Care | Payer: PRIVATE HEALTH INSURANCE | Attending: Internal Medicine | Admitting: Internal Medicine

## 2023-06-08 DIAGNOSIS — Z3202 Encounter for pregnancy test, result negative: Secondary | ICD-10-CM | POA: Diagnosis not present

## 2023-06-08 DIAGNOSIS — R81 Glycosuria: Secondary | ICD-10-CM | POA: Insufficient documentation

## 2023-06-08 DIAGNOSIS — R109 Unspecified abdominal pain: Secondary | ICD-10-CM | POA: Diagnosis present

## 2023-06-08 DIAGNOSIS — Z113 Encounter for screening for infections with a predominantly sexual mode of transmission: Secondary | ICD-10-CM | POA: Insufficient documentation

## 2023-06-08 LAB — POCT URINALYSIS DIP (MANUAL ENTRY)
Bilirubin, UA: NEGATIVE
Blood, UA: NEGATIVE
Glucose, UA: 500 mg/dL — AB
Ketones, POC UA: NEGATIVE mg/dL
Leukocytes, UA: NEGATIVE
Nitrite, UA: NEGATIVE
Spec Grav, UA: 1.015 (ref 1.010–1.025)
Urobilinogen, UA: 1 E.U./dL
pH, UA: 7 (ref 5.0–8.0)

## 2023-06-08 LAB — POCT FASTING CBG KUC MANUAL ENTRY: POCT Glucose (KUC): 369 mg/dL — AB (ref 70–99)

## 2023-06-08 LAB — POCT URINE PREGNANCY: Preg Test, Ur: NEGATIVE

## 2023-06-08 NOTE — ED Triage Notes (Signed)
Patient presents to Urgent Care with complaints of abdominal cramping in pelvic region since 2 days. Patient reports not having any discharge or odor. She want to check all STD testing to make sure. Marland Kitchen

## 2023-06-08 NOTE — ED Provider Notes (Signed)
[name]    CSN: [id number] Arrival date & time: 06/08/23  1332      History   Chief Complaint Chief Complaint  Patient presents with   Exposure to STD    HPI Erica Nguyen is a 52 y.o. female.   Patient presents with lower abdominal cramping that is intermittent for the past 2 to 3 days.  Patient denies dysuria, urinary frequency, hematuria, abnormal vaginal bleeding, vaginal discharge, vaginal odor.  Patient is concerned for STDs given that her previous boyfriend was unfaithful.  She denies any confirmed exposure to STD.  Patient reports her last menstrual cycle was about 7 months ago.  Patient is not reporting any nausea, vomiting, diarrhea, abnormal bowel movements.  Denies any fever.   Exposure to STD    Past Medical History:  Diagnosis Date   Abnormal Pap smear    Diabetes mellitus    insulin-dependent type 2   Family history of adverse reaction to anesthesia    sister has n/v   Fibroid    HNP (herniated nucleus pulposus)    Hypertension    chronic hypertension   Pregnancy induced hypertension     Patient Active Problem List   Diagnosis Date Noted   Abnormal vaginal bleeding 01/02/2021   Abnormal Pap smear of cervix 02/10/2020   NSTEMI (non-ST elevated myocardial infarction) ([facility]) 07/31/2017   Acute non Q wave myocardial infarction ([facility]) 07/20/2017   Ambulatory dysfunction    Intractable pain    Uncontrolled type 2 diabetes mellitus without complication, with long-term current use of insulin    Sciatica of right side    Hyperglycemia 10/09/2016   Lumbar herniated disc 10/09/2016   Myelopathy ([facility]) 10/09/2016   Hypercholesterolemia 01/23/2016   Vitamin D deficiency 01/23/2016   Vaginal candidiasis 11/29/2015   Spinal stenosis of lumbar region 11/22/2015   Obesity, Class II, BMI 35-39.9, with comorbidity 10/15/2015   Lumbosacral radiculopathy due to osteoarthritis of spine 06/10/2015   Fibroids, intramural 06/27/2014   Chest pain  01/06/2012   Dyspnea 01/06/2012   Controlled type 2 diabetes mellitus with hyperglycemia ([facility]) 09/25/2007   Essential hypertension 09/25/2007    Past Surgical History:  Procedure Laterality Date   BACK SURGERY     CARDIAC CATHETERIZATION  02/29/2008   Normal coronary arteries -- Normal LV (left ventricular) systolic function -- Mild to moderate elevation in left ventricular end-diastolic  pressure secondary to hypertension, diabetes and obesity   CHOLECYSTECTOMY  1995   COLPOSCOPY     CORONARY STENT INTERVENTION N/A 07/21/2017   Procedure: Coronary Stent Intervention;  Surgeon: Rinaldo Cloud, MD;  Location: [facility];  Service: Cardiovascular;  Laterality: N/A;   DECOMPRESSIVE LUMBAR LAMINECTOMY LEVEL 1 Right 11/22/2015   Procedure: DECOMPRESSIVE LUMBAR LAMINECTOMY L5-S1 ON RIGHT,FORAMINOTOMIES L5,S1 DISCECTOMY L5,S1;  Surgeon: Jene Every, MD;  Location: [facility];  Service: Orthopedics;  Laterality: Right;   GYNECOLOGIC CRYOSURGERY  1998   Hysteroscopy, D&C, Novasure ablation, removal of Intrauterine device  01/08/2008   LEFT HEART CATH AND CORONARY ANGIOGRAPHY N/A 07/21/2017   Procedure: Left Heart Cath and Coronary Angiography;  Surgeon: Rinaldo Cloud, MD;  Location: [facility];  Service: Cardiovascular;  Laterality: N/A;   LUMBAR LAMINECTOMY/DECOMPRESSION MICRODISCECTOMY Right 10/11/2016   Procedure: REVISION MICRO LUMBAR DECOMPRESSION L5-S1 RIGHT;  Surgeon: Jene Every, MD;  Location: [facility];  Service: Orthopedics;  Laterality: Right;   NOVASURE ABLATION  01/08/2008    OB History     Gravida  3   Para  1   Term  1   Preterm      AB  2   Living  1      SAB  2   IAB      Ectopic      Multiple      Live Births  1            Home Medications    Prior to Admission medications   Medication Sig Start Date End Date Taking? Authorizing Provider  acetaminophen (TYLENOL) 500 MG tablet Take 1,000 mg by mouth every 6 (six) hours as needed for  moderate pain or headache.   Yes [provider]  albuterol (VENTOLIN HFA) 108 (90 Base) MCG/ACT inhaler Inhale 1-2 puffs into the lungs every 6 (six) hours as needed for wheezing or shortness of breath. 03/15/23  Yes Ervin Knack E, FNP  aspirin EC 81 MG EC tablet Take 1 tablet (81 mg total) by mouth daily. 07/24/17  Yes Rinaldo Cloud, MD  ezetimibe (ZETIA) 10 MG tablet Take 1 tablet (10 mg total) by mouth daily. 12/25/22  Yes Alver Sorrow, NP  glucose blood test strip For blood sugar monitoring 3x daily with the One Touch Verio Flex glucometer 10/22/18  Yes [provider]  hydrOXYzine (ATARAX) 10 MG tablet Take 1 tablet (10 mg total) by mouth 2 (two) times daily as needed for anxiety. 08/30/22  Yes Alver Sorrow, NP  ibuprofen (ADVIL) 800 MG tablet Take 1 tablet (800 mg total) by mouth 3 (three) times daily. 08/04/22  Yes Raspet, Erin K, PA-C  influenza vac split quadrivalent PF (FLUARIX) 0.5 ML injection Inject into the muscle. 10/15/22  Yes Judyann Munson, MD  insulin lispro (HUMALOG) 100 UNIT/ML injection Inject 20 Units into the skin daily before supper.    Yes [provider]  LANTUS SOLOSTAR 100 UNIT/ML Solostar Pen INJECT 50 UNITS DOSE INTO THE SKIN DAILY. 03/10/19  Yes [provider]  losartan (COZAAR) 50 MG tablet Take 1 tablet (50 mg total) by mouth daily. 02/07/23 08/06/23 Yes Alver Sorrow, NP  metoprolol succinate (TOPROL-XL) 50 MG 24 hr tablet Take 1 tablet (50 mg total) by mouth 2 (two) times daily. Take with or immediately following a meal. 02/07/23  Yes Crenshaw, Madolyn Frieze, MD  [facility] 5 MG/0.5ML Pen Inject into the skin. 07/05/22  Yes [provider]  nitroGLYCERIN (NITROSTAT) 0.4 MG SL tablet Place 1 tablet (0.4 mg total) under the tongue every 5 (five) minutes x 3 doses as needed for chest pain. 05/18/21  Yes Alver Sorrow, NP  rosuvastatin (CRESTOR) 40 MG tablet Take 1 tablet (40 mg total) by mouth daily. 09/02/22  Yes Alver Sorrow, NP  benzonatate (TESSALON) 100 MG capsule Take 1 capsule (100 mg total) by mouth every 8 (eight) hours as needed for cough. 03/15/23   Gustavus Bryant, FNP    Family History Family History  Problem Relation Age of Onset   Heart disease Mother    Diabetes Mother    Hypertension Father    Diabetes Father    Kidney failure Father    Stroke Father    Hypertension Paternal Grandmother    Diabetes Paternal Grandmother    Stroke Paternal Grandmother    Diabetes Maternal Grandmother    Ovarian cancer Maternal Grandmother    Diabetes Paternal Grandfather     Social History Social History   Tobacco Use   Smoking status: Never   Smokeless tobacco: Never  Vaping Use  Vaping Use: Never used  Substance Use Topics   Alcohol use: No   Drug use: No     Allergies   Amoxicillin   Review of Systems Review of Systems Per HPI  Physical Exam Triage Vital Signs ED Triage Vitals  Enc Vitals Group     BP 06/08/23 1404 126/83     Pulse Rate 06/08/23 1404 92     Resp 06/08/23 1404 16     Temp 06/08/23 1404 97.9 F (36.6 C)     Temp Source 06/08/23 1404 Oral     SpO2 06/08/23 1404 98 %     Weight --      Height --      Head Circumference --      Peak Flow --      Pain Score 06/08/23 1403 7     Pain Loc --      Pain Edu? --      Excl. in [place]? --    No data found.  Updated Vital Signs BP 126/83 (BP Location: Left Arm)   Pulse 92   Temp 97.9 F (36.6 C) (Oral)   Resp 16   SpO2 98%   Visual Acuity Right Eye Distance:   Left Eye Distance:   Bilateral Distance:    Right Eye Near:   Left Eye Near:    Bilateral Near:     Physical Exam Constitutional:      General: She is not in acute distress.    Appearance: Normal appearance. She is not toxic-appearing or diaphoretic.  HENT:     Head: Normocephalic and atraumatic.  Eyes:     Extraocular Movements: Extraocular movements intact.     Conjunctiva/sclera: Conjunctivae normal.  Pulmonary:     Effort:  Pulmonary effort is normal.  Abdominal:     General: Bowel sounds are normal. There is no distension.     Palpations: Abdomen is soft.     Tenderness: There is abdominal tenderness in the suprapubic area.     Comments: She has very mild tenderness to palpation to lower abdomen.  Neurological:     General: No focal deficit present.     Mental Status: She is alert and oriented to person, place, and time. Mental status is at baseline.  Psychiatric:        Mood and Affect: Mood normal.        Behavior: Behavior normal.        Thought Content: Thought content normal.        Judgment: Judgment normal.      UC Treatments / Results  Labs (all labs ordered are listed, but only abnormal results are displayed) Labs Reviewed  POCT URINALYSIS DIP (MANUAL ENTRY) - Abnormal; Notable for the following components:      Result Value   Glucose, UA =500 (*)    Protein Ur, POC trace (*)    All other components within normal limits  POCT FASTING CBG [facility] MANUAL ENTRY - Abnormal; Notable for the following components:   POCT Glucose ([facility]) 369 (*)    All other components within normal limits  POCT URINE PREGNANCY  CERVICOVAGINAL ANCILLARY ONLY    EKG   Radiology No results found.  Procedures Procedures (including critical care time)  Medications Ordered in UC Medications - No data to display  Initial Impression / Assessment and Plan / UC Course  I have reviewed the triage vital signs and the nursing notes.  Pertinent labs & imaging results that were available during my  care of the patient were reviewed by me and considered in my medical decision making (see chart for details).     UA unremarkable except for glucose.  Patient's glucose was checked today and was abnormally high.  Patient reports that she has not taken her insulin today and that over the past few days her blood glucose has been ranging in the 100s.  Therefore, patient was advised to monitor glucose very diligently and take  medication as soon as possible.  Patient advised to follow-up if it remains elevated.  Cervicovaginal swab pending to test for any form of vaginitis that could be causing patient's discomfort.  There are no signs of acute abdomen on exam that warrant further evaluation at the emergency department.  Although, patient was advised to follow-up if any symptoms persist or worsen.  Patient verbalized understanding and was agreeable with plan. Final Clinical Impressions(s) / UC Diagnoses   Final diagnoses:  Abdominal cramping  Screening examination for venereal disease  Glycosuria  Urine pregnancy test negative     Discharge Instructions      Your vaginal swab is pending.  Will call if it is abnormal and treat.  Follow-up with the ER if symptoms significantly worsen.     ED Prescriptions   None    PDMP not reviewed this encounter.   Gustavus Bryant, [place] 06/08/23 [phone]

## 2023-06-08 NOTE — Discharge Instructions (Signed)
Your vaginal swab is pending.  Will call if it is abnormal and treat.  Follow-up with the ER if symptoms significantly worsen.

## 2023-06-09 LAB — CERVICOVAGINAL ANCILLARY ONLY
Bacterial Vaginitis (gardnerella): NEGATIVE
Candida Glabrata: NEGATIVE
Candida Vaginitis: POSITIVE — AB
Chlamydia: NEGATIVE
Comment: NEGATIVE
Comment: NEGATIVE
Comment: NEGATIVE
Comment: NEGATIVE
Comment: NEGATIVE
Comment: NORMAL
Neisseria Gonorrhea: NEGATIVE
Trichomonas: NEGATIVE

## 2023-06-11 ENCOUNTER — Telehealth (HOSPITAL_COMMUNITY): Payer: Self-pay | Admitting: Emergency Medicine

## 2023-06-11 ENCOUNTER — Telehealth: Payer: Self-pay

## 2023-06-11 MED ORDER — FLUCONAZOLE 150 MG PO TABS: 150.0000 mg | ORAL_TABLET | Freq: Once | ORAL | 0 refills | Status: AC

## 2023-07-25 ENCOUNTER — Other Ambulatory Visit: Payer: Self-pay

## 2023-09-12 ENCOUNTER — Ambulatory Visit (HOSPITAL_BASED_OUTPATIENT_CLINIC_OR_DEPARTMENT_OTHER): Payer: PRIVATE HEALTH INSURANCE | Admitting: Family

## 2023-09-15 NOTE — Progress Notes (Unsigned)
HPI: FU CAD. Admitted 7/18 with NSTEMI (peak troponin 0.79). Cared for by Dr Sharyn Lull. Cath revealed EF 35-45; 75 RCA, 75 distal LAD; pt had PCI of RCA and LAD with DES.   Echocardiogram December 2018 showed normal LV systolic function, grade 2 diastolic dysfunction.  Nuclear study 12/22 showed EF 64 showed breast and diaphragmatic attenuation but no ischemia.  Since last seen she denies dyspnea, palpitations or syncope.  She has occasional pain in her left chest area radiating to her left upper extremity.  Typically lasts 5 minutes and resolve spontaneously.  Not exertional, pleuritic or positional.  No associated symptoms.  She has had this intermittently in the past.  Current Outpatient Medications  Medication Sig Dispense Refill   acetaminophen (TYLENOL) 500 MG tablet Take 1,000 mg by mouth every 6 (six) hours as needed for moderate pain or headache.     aspirin EC 81 MG EC tablet Take 1 tablet (81 mg total) by mouth daily. 30 tablet 3   ezetimibe (ZETIA) 10 MG tablet Take 1 tablet (10 mg total) by mouth daily. 90 tablet 2   glucose blood test strip For blood sugar monitoring 3x daily with the One Touch Verio Flex glucometer     insulin lispro (HUMALOG) 100 UNIT/ML injection Inject 20 Units into the skin daily before supper.      LANTUS SOLOSTAR 100 UNIT/ML Solostar Pen INJECT 50 UNITS DOSE INTO THE SKIN DAILY.     losartan (COZAAR) 50 MG tablet Take 1 tablet (50 mg total) by mouth daily. 90 tablet 1   metoprolol succinate (TOPROL-XL) 50 MG 24 hr tablet Take 1 tablet (50 mg total) by mouth 2 (two) times daily. Take with or immediately following a meal. 180 tablet 3   MOUNJARO 5 MG/0.5ML Pen Inject into the skin.     rosuvastatin (CRESTOR) 40 MG tablet Take 1 tablet (40 mg total) by mouth daily. 90 tablet 3   albuterol (VENTOLIN HFA) 108 (90 Base) MCG/ACT inhaler Inhale 1-2 puffs into the lungs every 6 (six) hours as needed for wheezing or shortness of breath. (Patient not taking: Reported on  09/16/2023) 1 each 0   benzonatate (TESSALON) 100 MG capsule Take 1 capsule (100 mg total) by mouth every 8 (eight) hours as needed for cough. (Patient not taking: Reported on 09/16/2023) 21 capsule 0   hydrOXYzine (ATARAX) 10 MG tablet Take 1 tablet (10 mg total) by mouth 2 (two) times daily as needed for anxiety. (Patient not taking: Reported on 09/16/2023) 30 tablet 0   ibuprofen (ADVIL) 800 MG tablet Take 1 tablet (800 mg total) by mouth 3 (three) times daily. (Patient not taking: Reported on 09/16/2023) 21 tablet 0   influenza vac split quadrivalent PF (FLUARIX) 0.5 ML injection Inject into the muscle. (Patient not taking: Reported on 09/16/2023) 0.5 mL 0   nitroGLYCERIN (NITROSTAT) 0.4 MG SL tablet Place 1 tablet (0.4 mg total) under the tongue every 5 (five) minutes x 3 doses as needed for chest pain. (Patient not taking: Reported on 09/16/2023) 25 tablet 12   No current facility-administered medications for this visit.   Facility-Administered Medications Ordered in Other Visits  Medication Dose Route Frequency Provider Last Rate Last Admin   technetium tetrofosmin (TC-MYOVIEW) injection 30.8 millicurie  30.8 millicurie Intravenous Once PRN Quintella Reichert, MD         Past Medical History:  Diagnosis Date   Abnormal Pap smear    Diabetes mellitus    insulin-dependent type 2  Family history of adverse reaction to anesthesia    sister has n/v   Fibroid    HNP (herniated nucleus pulposus)    Hypertension    chronic hypertension   Pregnancy induced hypertension     Past Surgical History:  Procedure Laterality Date   BACK SURGERY     CARDIAC CATHETERIZATION  02/29/2008   Normal coronary arteries -- Normal LV (left ventricular) systolic function -- Mild to moderate elevation in left ventricular end-diastolic  pressure secondary to hypertension, diabetes and obesity   CHOLECYSTECTOMY  1995   COLPOSCOPY     CORONARY STENT INTERVENTION N/A 07/21/2017   Procedure: Coronary Stent  Intervention;  Surgeon: Rinaldo Cloud, MD;  Location: [facility];  Service: Cardiovascular;  Laterality: N/A;   DECOMPRESSIVE LUMBAR LAMINECTOMY LEVEL 1 Right 11/22/2015   Procedure: DECOMPRESSIVE LUMBAR LAMINECTOMY L5-S1 ON RIGHT,FORAMINOTOMIES L5,S1 DISCECTOMY L5,S1;  Surgeon: Jene Every, MD;  Location: [facility];  Service: Orthopedics;  Laterality: Right;   GYNECOLOGIC CRYOSURGERY  1998   Hysteroscopy, D&C, Novasure ablation, removal of Intrauterine device  01/08/2008   LEFT HEART CATH AND CORONARY ANGIOGRAPHY N/A 07/21/2017   Procedure: Left Heart Cath and Coronary Angiography;  Surgeon: Rinaldo Cloud, MD;  Location: [facility];  Service: Cardiovascular;  Laterality: N/A;   LUMBAR LAMINECTOMY/DECOMPRESSION MICRODISCECTOMY Right 10/11/2016   Procedure: REVISION MICRO LUMBAR DECOMPRESSION L5-S1 RIGHT;  Surgeon: Jene Every, MD;  Location: [facility];  Service: Orthopedics;  Laterality: Right;   NOVASURE ABLATION  01/08/2008    Social History   Socioeconomic History   Marital status: Single    Spouse name: Not on file   Number of children: 1   Years of education: Not on file   Highest education level: Not on file  Occupational History   Occupation: [employer], consierge  Tobacco Use   Smoking status: Never   Smokeless tobacco: Never  Vaping Use   Vaping status: Never Used  Substance and Sexual Activity   Alcohol use: No   Drug use: No   Sexual activity: Not Currently    Birth control/protection: Surgical    Comment: ablation  Other Topics Concern   Not on file  Social History Narrative   Not on file   Social Determinants of Health   Financial Resource Strain: Not on file  Food Insecurity: Unknown (06/29/2023)   Received from [facility]   Hunger Vital Sign    Worried About Running Out of Food in the Last Year: Patient declined to answer    Ran Out of Food in the Last Year: Patient declined to answer  Transportation Needs: Not on file (06/29/2023)  Physical  Activity: Not on file  Stress: Not on file  Social Connections: Not on file  Intimate Partner Violence: Not on file    Family History  Problem Relation Age of Onset   Heart disease Mother    Diabetes Mother    Hypertension Father    Diabetes Father    Kidney failure Father    Stroke Father    Hypertension Paternal Grandmother    Diabetes Paternal Grandmother    Stroke Paternal Grandmother    Diabetes Maternal Grandmother    Ovarian cancer Maternal Grandmother    Diabetes Paternal Grandfather     ROS: no fevers or chills, productive cough, hemoptysis, dysphasia, odynophagia, melena, hematochezia, dysuria, hematuria, rash, seizure activity, orthopnea, PND, pedal edema, claudication. Remaining systems are negative.  Physical Exam: Well-developed well-nourished in no acute distress.  Skin is warm and dry.  HEENT is normal.  Neck is supple.  Chest is clear to auscultation with normal expansion.  Cardiovascular exam is regular rate and rhythm.  Abdominal exam nontender or distended. No masses palpated. Extremities show no edema. neuro grossly intact  EKG Interpretation Date/Time:  Tuesday September 16 2023 16:14:03 EDT Ventricular Rate:  95 PR Interval:  136 QRS Duration:  86 QT Interval:  350 QTC Calculation: 439 R Axis:   46  Text Interpretation: Normal sinus rhythm Nonspecific T wave abnormality When compared with ECG of 22-Nov-2021 13:34, No significant change was found Confirmed by Olga Millers ([id number]) on 09/16/2023 4:17:23 PM    A/P  1 coronary artery disease- Continue medical therapy with aspirin and statin.   2 ischemic cardiomyopathy-continue beta-blocker and ACE inhibitor.  LV function improved on most recent echo and nuclear study.   3 hypertension-BP controlled.  Continue present medical regimen.  Check potassium and renal function.   4 hyperlipidemia-continue statin.  Check lipids and liver.   5 obesity-we discussed diet, exercise and weight loss.   She has lost 25 pounds recently and I congratulated her on this.  3 chest pain-symptoms are somewhat atypical.  Will arrange stress PET to further assess.  Olga Millers, MD

## 2023-09-16 ENCOUNTER — Encounter: Payer: Self-pay | Admitting: Cardiology

## 2023-09-16 ENCOUNTER — Ambulatory Visit: Payer: PRIVATE HEALTH INSURANCE | Attending: Family | Admitting: Cardiology

## 2023-09-16 VITALS — BP 118/70 | HR 95 | Ht 66.0 in | Wt 230.2 lb

## 2023-09-16 DIAGNOSIS — E785 Hyperlipidemia, unspecified: Secondary | ICD-10-CM | POA: Diagnosis not present

## 2023-09-16 DIAGNOSIS — I10 Essential (primary) hypertension: Secondary | ICD-10-CM | POA: Diagnosis not present

## 2023-09-16 DIAGNOSIS — I2510 Atherosclerotic heart disease of native coronary artery without angina pectoris: Secondary | ICD-10-CM

## 2023-09-16 DIAGNOSIS — R0789 Other chest pain: Secondary | ICD-10-CM | POA: Diagnosis not present

## 2023-09-16 DIAGNOSIS — I25118 Atherosclerotic heart disease of native coronary artery with other forms of angina pectoris: Secondary | ICD-10-CM | POA: Diagnosis not present

## 2023-09-16 DIAGNOSIS — I255 Ischemic cardiomyopathy: Secondary | ICD-10-CM

## 2023-09-16 MED ORDER — METOPROLOL SUCCINATE ER 50 MG PO TB24: 50.0000 mg | ORAL_TABLET | Freq: Two times a day (BID) | ORAL | 3 refills | Status: DC

## 2023-09-16 MED ORDER — LOSARTAN POTASSIUM 50 MG PO TABS: 50.0000 mg | ORAL_TABLET | Freq: Every day | ORAL | 3 refills | Status: DC

## 2023-09-16 MED ORDER — ROSUVASTATIN CALCIUM 40 MG PO TABS: 40.0000 mg | ORAL_TABLET | Freq: Every day | ORAL | 3 refills | Status: DC

## 2023-09-16 MED ORDER — EZETIMIBE 10 MG PO TABS: 10.0000 mg | ORAL_TABLET | Freq: Every day | ORAL | 3 refills | Status: DC

## 2023-09-16 NOTE — Patient Instructions (Addendum)
Lab Work:  Your physician recommends that you return for lab work FASTING  If you have labs (blood work) drawn today and your tests are completely normal, you will receive your results only by: MyChart Message (if you have MyChart) OR A paper copy in the mail If you have any lab test that is abnormal or we need to change your treatment, we will call you to review the results.   Testing/Procedures:  How to Prepare for Your Cardiac PET/CT Stress Test:  1. Please take these medications before your test:   2. Nothing to eat or drink, except water, 3 hours prior to arrival time.   NO caffeine/decaffeinated products, or chocolate 12 hours prior to arrival.  3. NO perfume, cologne or lotion on chest or abdomen area.          - FEMALES - Please avoid wearing dresses to this appointment.  4. Total time is 1 to 2 hours; you may want to bring reading material for the waiting time.  5. Please report to Radiology at the [facility] Main Entrance 30 minutes early for your test.  [address] [place], [place] [address]    Diabetic Preparation:  Hold oral medications. You may take NPH and Lantus insulin. Do not take Humalog or Humulin R (Regular Insulin) the day of your test. Check blood sugars prior to leaving the house. If able to eat breakfast prior to 3 hour fasting, you may take all medications, including your insulin, Do not worry if you miss your breakfast dose of insulin - start at your next meal. Patients who wear a continuous glucose monitor MUST remove the device prior to scanning.   In preparation for your appointment, medication and supplies will be purchased.  Appointment availability is limited, so if you need to cancel or reschedule, please call the Radiology Department at [phone] [facility]) OR [phone] [facility])  24 hours in advance to avoid a cancellation fee of $100.00  What to Expect After you Arrive:  Once you arrive and check in for your  appointment, you will be taken to a preparation room within the Radiology Department.  A technologist or Nurse will obtain your medical history, verify that you are correctly prepped for the exam, and explain the procedure.  Afterwards,  an IV will be started in your arm and electrodes will be placed on your skin for EKG monitoring during the stress portion of the exam. Then you will be escorted to the PET/CT scanner.  There, staff will get you positioned on the scanner and obtain a blood pressure and EKG.  During the exam, you will continue to be connected to the EKG and blood pressure machines.  A small, safe amount of a radioactive tracer will be injected in your IV to obtain a series of pictures of your heart along with an injection of a stress agent.    After your Exam:  It is recommended that you eat a meal and drink a caffeinated beverage to counter act any effects of the stress agent.  Drink plenty of fluids for the remainder of the day and urinate frequently for the first couple of hours after the exam.  Your doctor will inform you of your test results within 7-10 business days.  For more information and frequently asked questions, please visit our website : [email or web address]  For questions about your test or how to prepare for your test, please call: Cardiac Imaging Nurse Navigators Office: [phone]   Follow-Up: At  [facility], you and your health needs are our priority.  As part of our continuing mission to provide you with exceptional heart care, we have created designated Provider Care Teams.  These Care Teams include your primary Cardiologist (physician) and Advanced Practice Providers (APPs -  Physician Assistants and Nurse Practitioners) who all work together to provide you with the care you need, when you need it.  We recommend signing up for the patient portal called "MyChart".  Sign up information is provided on this After Visit Summary.  MyChart is  used to connect with patients for Virtual Visits (Telemedicine).  Patients are able to view lab/test results, encounter notes, upcoming appointments, etc.  Non-urgent messages can be sent to your provider as well.   To learn more about what you can do with MyChart, go to [email or web address].    Your next appointment:   12 month(s)  Provider:   Olga Millers, MD

## 2023-10-10 ENCOUNTER — Encounter (HOSPITAL_COMMUNITY): Payer: Self-pay

## 2023-10-13 ENCOUNTER — Telehealth (HOSPITAL_COMMUNITY): Payer: Self-pay | Admitting: *Deleted

## 2023-10-13 NOTE — Telephone Encounter (Signed)
Attempted to call patient regarding upcoming cardiac PET appointment. Left message on voicemail with name and callback number  Larey Brick RN Navigator Cardiac Imaging [facility] [phone] Office [phone] Cell  Reminder to hold caffeine 12 hours prior to her cardiac PET scan.

## 2023-10-14 ENCOUNTER — Encounter (HOSPITAL_COMMUNITY): Admission: RE | Admit: 2023-10-14 | Payer: PRIVATE HEALTH INSURANCE | Source: Ambulatory Visit

## 2023-11-11 ENCOUNTER — Telehealth (HOSPITAL_COMMUNITY): Payer: Self-pay | Admitting: *Deleted

## 2023-11-11 NOTE — Telephone Encounter (Signed)
Attempted to call patient regarding upcoming cardiac PET appointment. Unable to leave VM. Erica Frame RN Navigator Cardiac Imaging [facility] [phone] Office

## 2023-11-11 NOTE — Telephone Encounter (Signed)
 Reaching out to patient to offer assistance regarding upcoming cardiac imaging study; pt verbalizes understanding of appt date/time, parking situation and where to check in, pre-test NPO status and medications ordered, and verified current allergies; name and call back number provided for further questions should they arise Erica Frame RN Navigator Cardiac Imaging [facility] [phone] office [phone] cell  Patient aware to avoid caffeine for 12 hours prior to test.

## 2023-11-12 ENCOUNTER — Encounter (HOSPITAL_COMMUNITY)
Admission: RE | Admit: 2023-11-12 | Discharge: 2023-11-12 | Disposition: A | Discharge status: Home / Self Care | Payer: PRIVATE HEALTH INSURANCE | Source: Ambulatory Visit | Attending: Cardiology | Admitting: Cardiology

## 2023-11-12 DIAGNOSIS — R0789 Other chest pain: Secondary | ICD-10-CM | POA: Diagnosis not present

## 2023-11-12 LAB — NM PET CT CARDIAC PERFUSION MULTI W/ABSOLUTE BLOODFLOW
LV dias vol: 99 mL (ref 46–106)
LV sys vol: 41 mL
MBFR: 1.92
Peak HR: 115 {beats}/min
Rest HR: 93 {beats}/min
Rest MBF: 1.07 ml/g/min
Rest Nuclear Isotope Dose: 27.3 mCi
ST Depression (mm): 0 mm
Stress MBF: 2.05 ml/g/min
Stress Nuclear Isotope Dose: 27.3 mCi
TID: 1.15

## 2023-11-12 MED ORDER — REGADENOSON 0.4 MG/5ML IV SOLN
0.4000 mg | Freq: Once | INTRAVENOUS | Status: AC
  Administered 2023-11-12: 0.4 mg via INTRAVENOUS

## 2023-11-12 MED ORDER — RUBIDIUM RB82 GENERATOR (RUBYFILL)
27.2700 | PACK | Freq: Once | INTRAVENOUS | Status: AC
  Administered 2023-11-12: 27.27 via INTRAVENOUS

## 2023-11-12 MED ORDER — REGADENOSON 0.4 MG/5ML IV SOLN
INTRAVENOUS | Status: AC
  Filled 2023-11-12: qty 5

## 2023-11-12 MED ORDER — RUBIDIUM RB82 GENERATOR (RUBYFILL)
27.2500 | PACK | Freq: Once | INTRAVENOUS | Status: AC
  Administered 2023-11-12: 27.25 via INTRAVENOUS

## 2023-11-17 ENCOUNTER — Ambulatory Visit (HOSPITAL_BASED_OUTPATIENT_CLINIC_OR_DEPARTMENT_OTHER): Payer: PRIVATE HEALTH INSURANCE | Admitting: Family Medicine

## 2023-12-04 ENCOUNTER — Ambulatory Visit (HOSPITAL_BASED_OUTPATIENT_CLINIC_OR_DEPARTMENT_OTHER): Payer: PRIVATE HEALTH INSURANCE | Admitting: Orthopaedic Surgery

## 2024-01-12 ENCOUNTER — Ambulatory Visit (HOSPITAL_BASED_OUTPATIENT_CLINIC_OR_DEPARTMENT_OTHER): Payer: PRIVATE HEALTH INSURANCE

## 2024-01-12 ENCOUNTER — Ambulatory Visit (HOSPITAL_BASED_OUTPATIENT_CLINIC_OR_DEPARTMENT_OTHER): Payer: PRIVATE HEALTH INSURANCE | Admitting: Orthopaedic Surgery

## 2024-01-12 DIAGNOSIS — M25552 Pain in left hip: Secondary | ICD-10-CM | POA: Diagnosis not present

## 2024-01-12 DIAGNOSIS — M25562 Pain in left knee: Secondary | ICD-10-CM | POA: Diagnosis not present

## 2024-01-12 DIAGNOSIS — G8929 Other chronic pain: Secondary | ICD-10-CM

## 2024-01-12 DIAGNOSIS — M25561 Pain in right knee: Secondary | ICD-10-CM

## 2024-01-12 DIAGNOSIS — M25551 Pain in right hip: Secondary | ICD-10-CM | POA: Diagnosis not present

## 2024-01-12 DIAGNOSIS — M542 Cervicalgia: Secondary | ICD-10-CM

## 2024-01-12 NOTE — Progress Notes (Signed)
Chief Complaint: Bilateral hip pain weakness     History of Present Illness:   01/12/2024: Presents today for follow-up of her bilateral hip pain.  She states that she has been experiencing pain about the lateral aspect of the hip now that has been going on for several years.  At this point her knees are starting to be effective as well with significant pain deep in the knee.  Her pain predominantly in her right shoulder is in the upper trap area.  She does have a history of a lumbar discectomy which was done a total of twice with Dr. Jillyn Hidden.  She is having a very difficult time laying directly on the side and this is forcing her to alternate sides.  She has previously done physical therapy for her back and has    Surgical History:   none  PMH/PSH/Family History/Social History/Meds/Allergies:    Past Medical History:  Diagnosis Date   Abnormal Pap smear    Diabetes mellitus    insulin-dependent type 2   Family history of adverse reaction to anesthesia    sister has n/v   Fibroid    HNP (herniated nucleus pulposus)    Hypertension    chronic hypertension   Pregnancy induced hypertension    Past Surgical History:  Procedure Laterality Date   BACK SURGERY     CARDIAC CATHETERIZATION  02/29/2008   Normal coronary arteries -- Normal LV (left ventricular) systolic function -- Mild to moderate elevation in left ventricular end-diastolic  pressure secondary to hypertension, diabetes and obesity   CHOLECYSTECTOMY  1995   COLPOSCOPY     CORONARY STENT INTERVENTION N/A 07/21/2017   Procedure: Coronary Stent Intervention;  Surgeon: Rinaldo Cloud, MD;  Location: [facility];  Service: Cardiovascular;  Laterality: N/A;   DECOMPRESSIVE LUMBAR LAMINECTOMY LEVEL 1 Right 11/22/2015   Procedure: DECOMPRESSIVE LUMBAR LAMINECTOMY L5-S1 ON RIGHT,FORAMINOTOMIES L5,S1 DISCECTOMY L5,S1;  Surgeon: Jene Every, MD;  Location: [facility];  Service: Orthopedics;   Laterality: Right;   GYNECOLOGIC CRYOSURGERY  1998   Hysteroscopy, D&C, Novasure ablation, removal of Intrauterine device  01/08/2008   LEFT HEART CATH AND CORONARY ANGIOGRAPHY N/A 07/21/2017   Procedure: Left Heart Cath and Coronary Angiography;  Surgeon: Rinaldo Cloud, MD;  Location: [facility];  Service: Cardiovascular;  Laterality: N/A;   LUMBAR LAMINECTOMY/DECOMPRESSION MICRODISCECTOMY Right 10/11/2016   Procedure: REVISION MICRO LUMBAR DECOMPRESSION L5-S1 RIGHT;  Surgeon: Jene Every, MD;  Location: [facility];  Service: Orthopedics;  Laterality: Right;   NOVASURE ABLATION  01/08/2008   Social History   Socioeconomic History   Marital status: Single    Spouse name: Not on file   Number of children: 1   Years of education: Not on file   Highest education level: Not on file  Occupational History   Occupation: [employer], consierge  Tobacco Use   Smoking status: Never   Smokeless tobacco: Never  Vaping Use   Vaping status: Never Used  Substance and Sexual Activity   Alcohol use: No   Drug use: No   Sexual activity: Not Currently    Birth control/protection: Surgical    Comment: ablation  Other Topics Concern   Not on file  Social History Narrative   Not on file   Social Drivers of Health   Financial Resource Strain: Not on  file  Food Insecurity: Unknown (06/29/2023)   Received from [facility]   Hunger Vital Sign    Worried About Running Out of Food in the Last Year: Patient declined to answer    Ran Out of Food in the Last Year: Patient declined to answer  Transportation Needs: Not on file (06/29/2023)  Physical Activity: Not on file  Stress: Not on file  Social Connections: Not on file   Family History  Problem Relation Age of Onset   Heart disease Mother    Diabetes Mother    Hypertension Father    Diabetes Father    Kidney failure Father    Stroke Father    Hypertension Paternal Grandmother    Diabetes Paternal Grandmother    Stroke Paternal Grandmother     Diabetes Maternal Grandmother    Ovarian cancer Maternal Grandmother    Diabetes Paternal Grandfather    Allergies  Allergen Reactions   Amoxicillin Other (See Comments)    Yeast Infection with AMOXIL Use Give pt yeast infection Has patient had a PCN reaction causing immediate rash, facial/tongue/throat swelling, SOB or lightheadedness with hypotension: No Has patient had a PCN reaction causing severe rash involving mucus membranes or skin necrosis: No Has patient had a PCN reaction that required hospitalization No Has patient had a PCN reaction occurring within the last 10 years: No If all of the above answers are "NO", then may proceed with Cephalosporin use.  Yeast Infection with AMOXIL Use   Current Outpatient Medications  Medication Sig Dispense Refill   acetaminophen (TYLENOL) 500 MG tablet Take 1,000 mg by mouth every 6 (six) hours as needed for moderate pain or headache.     albuterol (VENTOLIN HFA) 108 (90 Base) MCG/ACT inhaler Inhale 1-2 puffs into the lungs every 6 (six) hours as needed for wheezing or shortness of breath. (Patient not taking: Reported on 09/16/2023) 1 each 0   aspirin EC 81 MG EC tablet Take 1 tablet (81 mg total) by mouth daily. 30 tablet 3   benzonatate (TESSALON) 100 MG capsule Take 1 capsule (100 mg total) by mouth every 8 (eight) hours as needed for cough. (Patient not taking: Reported on 09/16/2023) 21 capsule 0   ezetimibe (ZETIA) 10 MG tablet Take 1 tablet (10 mg total) by mouth daily. 90 tablet 3   glucose blood test strip For blood sugar monitoring 3x daily with the One Touch Verio Flex glucometer     hydrOXYzine (ATARAX) 10 MG tablet Take 1 tablet (10 mg total) by mouth 2 (two) times daily as needed for anxiety. (Patient not taking: Reported on 09/16/2023) 30 tablet 0   ibuprofen (ADVIL) 800 MG tablet Take 1 tablet (800 mg total) by mouth 3 (three) times daily. (Patient not taking: Reported on 09/16/2023) 21 tablet 0   influenza vac split quadrivalent  PF (FLUARIX) 0.5 ML injection Inject into the muscle. (Patient not taking: Reported on 09/16/2023) 0.5 mL 0   insulin lispro (HUMALOG) 100 UNIT/ML injection Inject 20 Units into the skin daily before supper.      LANTUS SOLOSTAR 100 UNIT/ML Solostar Pen INJECT 50 UNITS DOSE INTO THE SKIN DAILY.     losartan (COZAAR) 50 MG tablet Take 1 tablet (50 mg total) by mouth daily. 90 tablet 3   metoprolol succinate (TOPROL-XL) 50 MG 24 hr tablet Take 1 tablet (50 mg total) by mouth 2 (two) times daily. Take with or immediately following a meal. 180 tablet 3   MOUNJARO 5 MG/0.5ML Pen Inject into the  skin.     nitroGLYCERIN (NITROSTAT) 0.4 MG SL tablet Place 1 tablet (0.4 mg total) under the tongue every 5 (five) minutes x 3 doses as needed for chest pain. (Patient not taking: Reported on 09/16/2023) 25 tablet 12   rosuvastatin (CRESTOR) 40 MG tablet Take 1 tablet (40 mg total) by mouth daily. 90 tablet 3   No current facility-administered medications for this visit.   Facility-Administered Medications Ordered in Other Visits  Medication Dose Route Frequency Provider Last Rate Last Admin   technetium tetrofosmin (TC-MYOVIEW) injection 30.8 millicurie  30.8 millicurie Intravenous Once PRN Turner, Cornelious Bryant, MD       No results found.  Review of Systems:   A ROS was performed including pertinent positives and negatives as documented in the HPI.  Physical Exam :   Constitutional: NAD and appears stated age Neurological: Alert and oriented Psych: Appropriate affect and cooperative There were no vitals taken for this visit.   Comprehensive Musculoskeletal Exam:    Tenderness about the upper trapezius.  Forward elevation is 165 compared to 170 on the contralateral side.  External rotation is to 65 compared to 70 on the left side internal rotation is to L5  Tenderness bilaterally about the greater trochanter with positive weakness with abduction bilaterally.  30 degrees internal/external rotation of the hip  which causes referred pain to the lateral trochanteric  Imaging:   Xray (4 views right knee, 4 views left knee, 4 views right hip, 4 views left hip): Normal   I personally reviewed and interpreted the radiographs.   Assessment:   53 y.o. female female with bilateral hip pain consistent with possible gluteus tendinopathy.  At this time she has failed physical therapy previous injections and lumbar discectomies.  At this time I have recommended bilateral MRIs of her hips that we can rule out any type of underlying gluteus pathology.  Overall I do believe this is causing an related to her patellofemoral type symptoms.  With regard to her neck I do believe she would benefit from physical therapy and specifically from some dry needling of the trapezius region.  I have also counseled her on a neck pillow.  Plan :    -Plan for follow-up after bilateral hip MRIs      I personally saw and evaluated the patient, and participated in the management and treatment plan.  Huel Cote, MD Attending Physician, Orthopedic Surgery  This document was dictated using Dragon voice recognition software. A reasonable attempt at proof reading has been made to minimize errors.

## 2024-01-22 ENCOUNTER — Encounter (HOSPITAL_BASED_OUTPATIENT_CLINIC_OR_DEPARTMENT_OTHER): Payer: Self-pay | Admitting: Orthopaedic Surgery

## 2024-01-23 ENCOUNTER — Other Ambulatory Visit: Payer: Self-pay | Admitting: Obstetrics and Gynecology

## 2024-01-23 DIAGNOSIS — Z1231 Encounter for screening mammogram for malignant neoplasm of breast: Secondary | ICD-10-CM

## 2024-01-25 ENCOUNTER — Other Ambulatory Visit: Payer: PRIVATE HEALTH INSURANCE

## 2024-01-25 ENCOUNTER — Ambulatory Visit
Admission: RE | Admit: 2024-01-25 | Discharge: 2024-01-25 | Disposition: A | Discharge status: Home / Self Care | Payer: PRIVATE HEALTH INSURANCE | Source: Ambulatory Visit | Attending: Orthopaedic Surgery | Admitting: Orthopaedic Surgery

## 2024-01-25 DIAGNOSIS — M25551 Pain in right hip: Secondary | ICD-10-CM

## 2024-02-11 ENCOUNTER — Ambulatory Visit (HOSPITAL_BASED_OUTPATIENT_CLINIC_OR_DEPARTMENT_OTHER): Payer: PRIVATE HEALTH INSURANCE | Admitting: Orthopaedic Surgery

## 2024-02-11 DIAGNOSIS — M25551 Pain in right hip: Secondary | ICD-10-CM | POA: Diagnosis not present

## 2024-02-11 DIAGNOSIS — M25552 Pain in left hip: Secondary | ICD-10-CM

## 2024-02-11 NOTE — Progress Notes (Signed)
Chief Complaint: Bilateral hip pain weakness     History of Present Illness:   02/11/2024: Presents today for follow-up of bilateral hips.  The right hip is been persistent 10 out of 10 pain.  She is experiencing the pain deep in the buttock area.  She is here today for MRI discussion   Presents today for follow-up of her bilateral hip pain.  She states that she has been experiencing pain about the lateral aspect of the hip now that has been going on for several years.  At this point her knees are starting to be effective as well with significant pain deep in the knee.  Her pain predominantly in her right shoulder is in the upper trap area.  She does have a history of a lumbar discectomy which was done a total of twice with Dr. Jillyn Hidden.  She is having a very difficult time laying directly on the side and this is forcing her to alternate sides.  She has previously done physical therapy for her back and has    Surgical History:   none  PMH/PSH/Family History/Social History/Meds/Allergies:    Past Medical History:  Diagnosis Date   Abnormal Pap smear    Diabetes mellitus    insulin-dependent type 2   Family history of adverse reaction to anesthesia    sister has n/v   Fibroid    HNP (herniated nucleus pulposus)    Hypertension    chronic hypertension   Pregnancy induced hypertension    Past Surgical History:  Procedure Laterality Date   BACK SURGERY     CARDIAC CATHETERIZATION  02/29/2008   Normal coronary arteries -- Normal LV (left ventricular) systolic function -- Mild to moderate elevation in left ventricular end-diastolic  pressure secondary to hypertension, diabetes and obesity   CHOLECYSTECTOMY  1995   COLPOSCOPY     CORONARY STENT INTERVENTION N/A 07/21/2017   Procedure: Coronary Stent Intervention;  Surgeon: Rinaldo Cloud, MD;  Location: [facility];  Service: Cardiovascular;  Laterality: N/A;   DECOMPRESSIVE LUMBAR LAMINECTOMY  LEVEL 1 Right 11/22/2015   Procedure: DECOMPRESSIVE LUMBAR LAMINECTOMY L5-S1 ON RIGHT,FORAMINOTOMIES L5,S1 DISCECTOMY L5,S1;  Surgeon: Jene Every, MD;  Location: [facility];  Service: Orthopedics;  Laterality: Right;   GYNECOLOGIC CRYOSURGERY  1998   Hysteroscopy, D&C, Novasure ablation, removal of Intrauterine device  01/08/2008   LEFT HEART CATH AND CORONARY ANGIOGRAPHY N/A 07/21/2017   Procedure: Left Heart Cath and Coronary Angiography;  Surgeon: Rinaldo Cloud, MD;  Location: [facility];  Service: Cardiovascular;  Laterality: N/A;   LUMBAR LAMINECTOMY/DECOMPRESSION MICRODISCECTOMY Right 10/11/2016   Procedure: REVISION MICRO LUMBAR DECOMPRESSION L5-S1 RIGHT;  Surgeon: Jene Every, MD;  Location: [facility];  Service: Orthopedics;  Laterality: Right;   NOVASURE ABLATION  01/08/2008   Social History   Socioeconomic History   Marital status: Single    Spouse name: Not on file   Number of children: 1   Years of education: Not on file   Highest education level: Not on file  Occupational History   Occupation: [employer], consierge  Tobacco Use   Smoking status: Never   Smokeless tobacco: Never  Vaping Use   Vaping status: Never Used  Substance and Sexual Activity   Alcohol use: No   Drug use: No   Sexual activity: Not Currently  Birth control/protection: Surgical    Comment: ablation  Other Topics Concern   Not on file  Social History Narrative   Not on file   Social Drivers of Health   Financial Resource Strain: Not on file  Food Insecurity: Unknown (06/29/2023)   Received from [facility]   Hunger Vital Sign    Worried About Running Out of Food in the Last Year: Patient declined to answer    Ran Out of Food in the Last Year: Patient declined to answer  Transportation Needs: Not on file (06/29/2023)  Physical Activity: Not on file  Stress: Not on file  Social Connections: Not on file   Family History  Problem Relation Age of Onset   Heart disease Mother    Diabetes  Mother    Hypertension Father    Diabetes Father    Kidney failure Father    Stroke Father    Hypertension Paternal Grandmother    Diabetes Paternal Grandmother    Stroke Paternal Grandmother    Diabetes Maternal Grandmother    Ovarian cancer Maternal Grandmother    Diabetes Paternal Grandfather    Allergies  Allergen Reactions   Amoxicillin Other (See Comments)    Yeast Infection with AMOXIL Use Give pt yeast infection Has patient had a PCN reaction causing immediate rash, facial/tongue/throat swelling, SOB or lightheadedness with hypotension: No Has patient had a PCN reaction causing severe rash involving mucus membranes or skin necrosis: No Has patient had a PCN reaction that required hospitalization No Has patient had a PCN reaction occurring within the last 10 years: No If all of the above answers are "NO", then may proceed with Cephalosporin use.  Yeast Infection with AMOXIL Use   Current Outpatient Medications  Medication Sig Dispense Refill   acetaminophen (TYLENOL) 500 MG tablet Take 1,000 mg by mouth every 6 (six) hours as needed for moderate pain or headache.     albuterol (VENTOLIN HFA) 108 (90 Base) MCG/ACT inhaler Inhale 1-2 puffs into the lungs every 6 (six) hours as needed for wheezing or shortness of breath. (Patient not taking: Reported on 09/16/2023) 1 each 0   aspirin EC 81 MG EC tablet Take 1 tablet (81 mg total) by mouth daily. 30 tablet 3   benzonatate (TESSALON) 100 MG capsule Take 1 capsule (100 mg total) by mouth every 8 (eight) hours as needed for cough. (Patient not taking: Reported on 09/16/2023) 21 capsule 0   ezetimibe (ZETIA) 10 MG tablet Take 1 tablet (10 mg total) by mouth daily. 90 tablet 3   glucose blood test strip For blood sugar monitoring 3x daily with the One Touch Verio Flex glucometer     hydrOXYzine (ATARAX) 10 MG tablet Take 1 tablet (10 mg total) by mouth 2 (two) times daily as needed for anxiety. (Patient not taking: Reported on 09/16/2023)  30 tablet 0   ibuprofen (ADVIL) 800 MG tablet Take 1 tablet (800 mg total) by mouth 3 (three) times daily. (Patient not taking: Reported on 09/16/2023) 21 tablet 0   influenza vac split quadrivalent PF (FLUARIX) 0.5 ML injection Inject into the muscle. (Patient not taking: Reported on 09/16/2023) 0.5 mL 0   insulin lispro (HUMALOG) 100 UNIT/ML injection Inject 20 Units into the skin daily before supper.      LANTUS SOLOSTAR 100 UNIT/ML Solostar Pen INJECT 50 UNITS DOSE INTO THE SKIN DAILY.     losartan (COZAAR) 50 MG tablet Take 1 tablet (50 mg total) by mouth daily. 90 tablet 3  metoprolol succinate (TOPROL-XL) 50 MG 24 hr tablet Take 1 tablet (50 mg total) by mouth 2 (two) times daily. Take with or immediately following a meal. 180 tablet 3   MOUNJARO 5 MG/0.5ML Pen Inject into the skin.     nitroGLYCERIN (NITROSTAT) 0.4 MG SL tablet Place 1 tablet (0.4 mg total) under the tongue every 5 (five) minutes x 3 doses as needed for chest pain. (Patient not taking: Reported on 09/16/2023) 25 tablet 12   rosuvastatin (CRESTOR) 40 MG tablet Take 1 tablet (40 mg total) by mouth daily. 90 tablet 3   No current facility-administered medications for this visit.   Facility-Administered Medications Ordered in Other Visits  Medication Dose Route Frequency Provider Last Rate Last Admin   technetium tetrofosmin (TC-MYOVIEW) injection 30.8 millicurie  30.8 millicurie Intravenous Once PRN Turner, Cornelious Bryant, MD       No results found.  Review of Systems:   A ROS was performed including pertinent positives and negatives as documented in the HPI.  Physical Exam :   Constitutional: NAD and appears stated age Neurological: Alert and oriented Psych: Appropriate affect and cooperative There were no vitals taken for this visit.   Comprehensive Musculoskeletal Exam:    Tenderness about the upper trapezius.  Forward elevation is 165 compared to 170 on the contralateral side.  External rotation is to 65 compared to 70  on the left side internal rotation is to L5  Tenderness bilaterally about the greater trochanter with positive weakness with abduction bilaterally.  30 degrees internal/external rotation of the hip which causes referred pain to the lateral trochanteric there is tenderness with a positive FADIR maneuver  Imaging:   Xray (4 views right knee, 4 views left knee, 4 views right hip, 4 views left hip): Normal   MRI right hip: There is an anterior superior labral tear  I personally reviewed and interpreted the radiographs.   Assessment:   53 y.o. female female with bilateral hip pain in the right hip today consistent with possible degenerative labral tearing.  In order to further tease apart the diagnosis I recommend ultrasound-guided injection of the right hip.  I would like her to begin physical therapy for strengthening of her right abductor mechanism.  I will plan to see her back in 1 month for reassessment  Plan :    -Right hip femoral acetabular injection provided after verbal consent obtained      I personally saw and evaluated the patient, and participated in the management and treatment plan.  Huel Cote, MD Attending Physician, Orthopedic Surgery  This document was dictated using Dragon voice recognition software. A reasonable attempt at proof reading has been made to minimize errors.

## 2024-02-12 ENCOUNTER — Ambulatory Visit: Payer: PRIVATE HEALTH INSURANCE

## 2024-02-24 ENCOUNTER — Ambulatory Visit: Payer: PRIVATE HEALTH INSURANCE

## 2024-03-10 ENCOUNTER — Ambulatory Visit (HOSPITAL_BASED_OUTPATIENT_CLINIC_OR_DEPARTMENT_OTHER): Payer: PRIVATE HEALTH INSURANCE | Admitting: Orthopaedic Surgery

## 2024-03-31 ENCOUNTER — Ambulatory Visit (HOSPITAL_BASED_OUTPATIENT_CLINIC_OR_DEPARTMENT_OTHER): Payer: PRIVATE HEALTH INSURANCE | Admitting: Orthopaedic Surgery

## 2024-04-06 ENCOUNTER — Other Ambulatory Visit (HOSPITAL_BASED_OUTPATIENT_CLINIC_OR_DEPARTMENT_OTHER): Payer: Self-pay

## 2024-04-06 ENCOUNTER — Telehealth (HOSPITAL_BASED_OUTPATIENT_CLINIC_OR_DEPARTMENT_OTHER): Payer: Self-pay

## 2024-04-06 ENCOUNTER — Encounter (HOSPITAL_BASED_OUTPATIENT_CLINIC_OR_DEPARTMENT_OTHER): Payer: Self-pay | Admitting: Student

## 2024-04-06 ENCOUNTER — Ambulatory Visit (HOSPITAL_BASED_OUTPATIENT_CLINIC_OR_DEPARTMENT_OTHER): Payer: PRIVATE HEALTH INSURANCE | Admitting: Student

## 2024-04-06 DIAGNOSIS — M25551 Pain in right hip: Secondary | ICD-10-CM

## 2024-04-06 DIAGNOSIS — M25511 Pain in right shoulder: Secondary | ICD-10-CM | POA: Diagnosis not present

## 2024-04-06 DIAGNOSIS — M5431 Sciatica, right side: Secondary | ICD-10-CM

## 2024-04-06 DIAGNOSIS — G8929 Other chronic pain: Secondary | ICD-10-CM

## 2024-04-06 MED ORDER — MELOXICAM 15 MG PO TABS
15.0000 mg | ORAL_TABLET | Freq: Every day | ORAL | 0 refills | Status: AC
Start: 2024-04-06 — End: 2024-04-20
  Filled 2024-04-06 (×2): qty 14, 14d supply, fill #0

## 2024-04-06 NOTE — Telephone Encounter (Signed)
 IC patient. Explained our form protocol. Patient states some days she is in extreme pain and is unable to work. She is asking intermittent leave for flare-ups in addition to being off for P.T. appointments. Please advise if okay.

## 2024-04-06 NOTE — Telephone Encounter (Signed)
 Hey tammy do you mind reaching out to this patient. Erica Nguyen is ok for her to intermittent leave to attend PT. She knows she will be charged $25 dollars for the paperwork to be filled out

## 2024-04-06 NOTE — Progress Notes (Signed)
 Chief Complaint: Right hip and shoulder pain     History of Present Illness:   04/06/2024: Erica Nguyen presents today for follow-up of her right hip and right shoulder.  Recent MRI of the right hip did suggest a labrum tear.  She does state had recent injections in the hip and shoulder have given her some relief although she is having some pain radiating down the back of the right leg into the calf.  Denies any numbness or tingling.  Also having pain from the base of the neck down to the right shoulder but nothing radiating down the arm.  She does have a history of 2 lumbar discectomies.  Has not yet been able to begin PT due to scheduling with work.   Surgical History:   none  PMH/PSH/Family History/Social History/Meds/Allergies:    Past Medical History:  Diagnosis Date   Abnormal Pap smear    Diabetes mellitus    insulin-dependent type 2   Family history of adverse reaction to anesthesia    sister has n/v   Fibroid    HNP (herniated nucleus pulposus)    Hypertension    chronic hypertension   Pregnancy induced hypertension    Past Surgical History:  Procedure Laterality Date   BACK SURGERY     CARDIAC CATHETERIZATION  02/29/2008   Normal coronary arteries -- Normal LV (left ventricular) systolic function -- Mild to moderate elevation in left ventricular end-diastolic  pressure secondary to hypertension, diabetes and obesity   CHOLECYSTECTOMY  1995   COLPOSCOPY     CORONARY STENT INTERVENTION N/A 07/21/2017   Procedure: Coronary Stent Intervention;  Surgeon: Rinaldo Cloud, MD;  Location: [facility];  Service: Cardiovascular;  Laterality: N/A;   DECOMPRESSIVE LUMBAR LAMINECTOMY LEVEL 1 Right 11/22/2015   Procedure: DECOMPRESSIVE LUMBAR LAMINECTOMY L5-S1 ON RIGHT,FORAMINOTOMIES L5,S1 DISCECTOMY L5,S1;  Surgeon: Jene Every, MD;  Location: [facility];  Service: Orthopedics;  Laterality: Right;   GYNECOLOGIC CRYOSURGERY  1998   Hysteroscopy,  D&C, Novasure ablation, removal of Intrauterine device  01/08/2008   LEFT HEART CATH AND CORONARY ANGIOGRAPHY N/A 07/21/2017   Procedure: Left Heart Cath and Coronary Angiography;  Surgeon: Rinaldo Cloud, MD;  Location: [facility];  Service: Cardiovascular;  Laterality: N/A;   LUMBAR LAMINECTOMY/DECOMPRESSION MICRODISCECTOMY Right 10/11/2016   Procedure: REVISION MICRO LUMBAR DECOMPRESSION L5-S1 RIGHT;  Surgeon: Jene Every, MD;  Location: [facility];  Service: Orthopedics;  Laterality: Right;   NOVASURE ABLATION  01/08/2008   Social History   Socioeconomic History   Marital status: Single    Spouse name: Not on file   Number of children: 1   Years of education: Not on file   Highest education level: Not on file  Occupational History   Occupation: [employer], consierge  Tobacco Use   Smoking status: Never   Smokeless tobacco: Never  Vaping Use   Vaping status: Never Used  Substance and Sexual Activity   Alcohol use: No   Drug use: No   Sexual activity: Not Currently    Birth control/protection: Surgical    Comment: ablation  Other Topics Concern   Not on file  Social History Narrative   Not on file   Social Drivers of Health   Financial Resource Strain: Not on file  Food Insecurity: Low Risk  (03/05/2024)   Received from  [facility]   Hunger Vital Sign    Worried About Running Out of Food in the Last Year: Never true    Ran Out of Food in the Last Year: Never true  Transportation Needs: No Transportation Needs (03/05/2024)   Received from [facility]    In the past 12 months, has lack of reliable transportation kept you from medical appointments, meetings, work or from getting things needed for daily living? : No  Physical Activity: Not on file  Stress: Not on file  Social Connections: Not on file   Family History  Problem Relation Age of Onset   Heart disease Mother    Diabetes Mother    Hypertension Father    Diabetes Father    Kidney  failure Father    Stroke Father    Hypertension Paternal Grandmother    Diabetes Paternal Grandmother    Stroke Paternal Grandmother    Diabetes Maternal Grandmother    Ovarian cancer Maternal Grandmother    Diabetes Paternal Grandfather    Allergies  Allergen Reactions   Amoxicillin Other (See Comments)    Yeast Infection with AMOXIL Use Give pt yeast infection Has patient had a PCN reaction causing immediate rash, facial/tongue/throat swelling, SOB or lightheadedness with hypotension: No Has patient had a PCN reaction causing severe rash involving mucus membranes or skin necrosis: No Has patient had a PCN reaction that required hospitalization No Has patient had a PCN reaction occurring within the last 10 years: No If all of the above answers are "NO", then may proceed with Cephalosporin use.  Yeast Infection with AMOXIL Use   Current Outpatient Medications  Medication Sig Dispense Refill   meloxicam (MOBIC) 15 MG tablet Take 1 tablet (15 mg total) by mouth daily for 14 days. 14 tablet 0   acetaminophen (TYLENOL) 500 MG tablet Take 1,000 mg by mouth every 6 (six) hours as needed for moderate pain or headache.     albuterol (VENTOLIN HFA) 108 (90 Base) MCG/ACT inhaler Inhale 1-2 puffs into the lungs every 6 (six) hours as needed for wheezing or shortness of breath. (Patient not taking: Reported on 09/16/2023) 1 each 0   aspirin EC 81 MG EC tablet Take 1 tablet (81 mg total) by mouth daily. 30 tablet 3   benzonatate (TESSALON) 100 MG capsule Take 1 capsule (100 mg total) by mouth every 8 (eight) hours as needed for cough. (Patient not taking: Reported on 09/16/2023) 21 capsule 0   ezetimibe (ZETIA) 10 MG tablet Take 1 tablet (10 mg total) by mouth daily. 90 tablet 3   glucose blood test strip For blood sugar monitoring 3x daily with the One Touch Verio Flex glucometer     hydrOXYzine (ATARAX) 10 MG tablet Take 1 tablet (10 mg total) by mouth 2 (two) times daily as needed for anxiety.  (Patient not taking: Reported on 09/16/2023) 30 tablet 0   ibuprofen (ADVIL) 800 MG tablet Take 1 tablet (800 mg total) by mouth 3 (three) times daily. (Patient not taking: Reported on 09/16/2023) 21 tablet 0   influenza vac split quadrivalent PF (FLUARIX) 0.5 ML injection Inject into the muscle. (Patient not taking: Reported on 09/16/2023) 0.5 mL 0   insulin lispro (HUMALOG) 100 UNIT/ML injection Inject 20 Units into the skin daily before supper.      LANTUS SOLOSTAR 100 UNIT/ML Solostar Pen INJECT 50 UNITS DOSE INTO THE SKIN DAILY.     losartan (COZAAR) 50 MG tablet Take 1 tablet (50 mg total)  by mouth daily. 90 tablet 3   metoprolol succinate (TOPROL-XL) 50 MG 24 hr tablet Take 1 tablet (50 mg total) by mouth 2 (two) times daily. Take with or immediately following a meal. 180 tablet 3   MOUNJARO 5 MG/0.5ML Pen Inject into the skin.     nitroGLYCERIN (NITROSTAT) 0.4 MG SL tablet Place 1 tablet (0.4 mg total) under the tongue every 5 (five) minutes x 3 doses as needed for chest pain. (Patient not taking: Reported on 09/16/2023) 25 tablet 12   rosuvastatin (CRESTOR) 40 MG tablet Take 1 tablet (40 mg total) by mouth daily. 90 tablet 3   No current facility-administered medications for this visit.   Facility-Administered Medications Ordered in Other Visits  Medication Dose Route Frequency Provider Last Rate Last Admin   technetium tetrofosmin (TC-MYOVIEW) injection 30.8 millicurie  30.8 millicurie Intravenous Once PRN Turner, Rufus Council, MD       No results found.  Review of Systems:   A ROS was performed including pertinent positives and negatives as documented in the HPI.  Physical Exam :   Constitutional: NAD and appears stated age Neurological: Alert and oriented Psych: Appropriate affect and cooperative There were no vitals taken for this visit.   Comprehensive Musculoskeletal Exam:    No tenderness with palpation in the cervical spine or paraspinal muscles.  Full cervical active range of  motion in all planes.  Tenderness along the right upper trapezius.  No pain over the glenohumeral joint or lateral deltoid.  Negative Spurling's.  Full shoulder active ROM bilaterally.  Passive right hip range of motion to 120 degrees flexion, 30 degrees external rotation, 20 degrees internal rotation.  Positive straight leg raise.  Ankle flexion and dorsiflexion strength 5/5.  Imaging:    Assessment:   53 y.o. female with pain in the right shoulder right leg.  Shoulder pain does seem to be more muscular in nature as this is limited to the upper trapezius distribution and does not radiate.  She does maintain full range of motion and strength at the shoulder.  Patient does have extensive lumbar history including 2 discectomy surgeries and does appear to be experiencing some sciatica as pain travels from the hip posteriorly down the right leg to the calf.  Do believe she would benefit significantly from physical therapy to address both of these issues.  We can provide documentation if needed to allow her to be excused from work to attend PT sessions.  Will plan to follow-up in another 6 weeks, sooner if needed, with myself or Dr. Hermina Loosen for reassessment.  Plan :    - Referral to physical therapy for eval and treatment of right shoulder/trapezius myofascial pain and right lower extremity sciatica - Return to clinic in 6 weeks for follow-up and reassessment      I personally saw and evaluated the patient, and participated in the management and treatment plan.   Sharrell Deck, PA-C Orthopedics

## 2024-04-06 NOTE — Telephone Encounter (Signed)
 Noted. Will process when forms/auth/$ received.

## 2024-04-16 ENCOUNTER — Telehealth: Payer: Self-pay | Admitting: Orthopaedic Surgery

## 2024-04-16 NOTE — Telephone Encounter (Signed)
 Pt submitted medical release form, short term disability forms, and $ 20.00 payment. Accepted [phone]

## 2024-04-16 NOTE — Telephone Encounter (Signed)
 Received

## 2024-04-17 ENCOUNTER — Other Ambulatory Visit (HOSPITAL_BASED_OUTPATIENT_CLINIC_OR_DEPARTMENT_OTHER): Payer: Self-pay

## 2024-04-17 MED ORDER — FLUCONAZOLE 150 MG PO TABS
150.0000 mg | ORAL_TABLET | Freq: Every day | ORAL | 0 refills | Status: DC
Start: 2024-04-17 — End: 2024-05-19
  Filled 2024-04-17 – 2024-05-01 (×2): qty 2, 2d supply, fill #0

## 2024-04-27 ENCOUNTER — Other Ambulatory Visit (HOSPITAL_BASED_OUTPATIENT_CLINIC_OR_DEPARTMENT_OTHER): Payer: Self-pay

## 2024-05-01 ENCOUNTER — Ambulatory Visit (HOSPITAL_BASED_OUTPATIENT_CLINIC_OR_DEPARTMENT_OTHER): Payer: PRIVATE HEALTH INSURANCE | Attending: Student | Admitting: Rehabilitative and Restorative Service Providers"

## 2024-05-01 ENCOUNTER — Other Ambulatory Visit (HOSPITAL_BASED_OUTPATIENT_CLINIC_OR_DEPARTMENT_OTHER): Payer: Self-pay

## 2024-05-01 NOTE — Therapy (Deleted)
 OUTPATIENT PHYSICAL THERAPY EVALUATION   Patient Name: Erica Nguyen MRN: [id number] DOB:1971-10-05, 53 y.o., female Today's Date: 05/01/2024  END OF SESSION:   Past Medical History:  Diagnosis Date   Abnormal Pap smear    Diabetes mellitus    insulin -dependent type 2   Family history of adverse reaction to anesthesia    sister has n/v   Fibroid    HNP (herniated nucleus pulposus)    Hypertension    chronic hypertension   Pregnancy induced hypertension    Past Surgical History:  Procedure Laterality Date   BACK SURGERY     CARDIAC CATHETERIZATION  02/29/2008   Normal coronary arteries -- Normal LV (left ventricular) systolic function -- Mild to moderate elevation in left ventricular end-diastolic  pressure secondary to hypertension, diabetes and obesity   CHOLECYSTECTOMY  1995   COLPOSCOPY     CORONARY STENT INTERVENTION N/A 07/21/2017   Procedure: Coronary Stent Intervention;  Surgeon: Chapman Commodore, MD;  Location: [facility];  Service: Cardiovascular;  Laterality: N/A;   DECOMPRESSIVE LUMBAR LAMINECTOMY LEVEL 1 Right 11/22/2015   Procedure: DECOMPRESSIVE LUMBAR LAMINECTOMY L5-S1 ON RIGHT,FORAMINOTOMIES L5,S1 DISCECTOMY L5,S1;  Surgeon: Orvan Blanch, MD;  Location: [facility];  Service: Orthopedics;  Laterality: Right;   GYNECOLOGIC CRYOSURGERY  1998   Hysteroscopy, D&C, Novasure ablation, removal of Intrauterine device  01/08/2008   LEFT HEART CATH AND CORONARY ANGIOGRAPHY N/A 07/21/2017   Procedure: Left Heart Cath and Coronary Angiography;  Surgeon: Chapman Commodore, MD;  Location: [facility];  Service: Cardiovascular;  Laterality: N/A;   LUMBAR LAMINECTOMY/DECOMPRESSION MICRODISCECTOMY Right 10/11/2016   Procedure: REVISION MICRO LUMBAR DECOMPRESSION L5-S1 RIGHT;  Surgeon: Orvan Blanch, MD;  Location: [facility];  Service: Orthopedics;  Laterality: Right;   NOVASURE ABLATION  01/08/2008   Patient Active Problem List   Diagnosis Date Noted   Abnormal vaginal  bleeding 01/02/2021   Abnormal Pap smear of cervix 02/10/2020   NSTEMI (non-ST elevated myocardial infarction) (HCC) 07/31/2017   Acute non Q wave myocardial infarction (HCC) 07/20/2017   Ambulatory dysfunction    Intractable pain    Uncontrolled type 2 diabetes mellitus without complication, with long-term current use of insulin     Sciatica of right side    Hyperglycemia 10/09/2016   Lumbar herniated disc 10/09/2016   Myelopathy (HCC) 10/09/2016   Hypercholesterolemia 01/23/2016   Vitamin D  deficiency 01/23/2016   Vaginal candidiasis 11/29/2015   Spinal stenosis of lumbar region 11/22/2015   Obesity, Class II, BMI 35-39.9, with comorbidity 10/15/2015   Lumbosacral radiculopathy due to osteoarthritis of spine 06/10/2015   Fibroids, intramural 06/27/2014   Chest pain 01/06/2012   Dyspnea 01/06/2012   Controlled type 2 diabetes mellitus with hyperglycemia (HCC) 09/25/2007   Essential hypertension 09/25/2007      REFERRING PROVIDER: Amedeo Jupiter, PA-C   REFERRING DIAG:  [phone] (ICD-10-CM) - Chronic right shoulder pain  M54.31 (ICD-10-CM) - Sciatica, right side    Rationale for Evaluation and Treatment: {HABREHAB:27488}  THERAPY DIAG:  No diagnosis found.  ONSET DATE: ***   SUBJECTIVE:  SUBJECTIVE STATEMENT: MRI L hip ordered  PERTINENT HISTORY:  She does state had recent injections in the hip and shoulder have given her some relief although she is having some pain radiating down the back of the right leg into the calf. Also having pain from the base of the neck down to the right shoulder but nothing radiating down the arm. She does have a history of 2 lumbar discectomies.   PAIN:  Are you having pain? {OPRCPAIN:27236}  PRECAUTIONS:  {Therapy precautions:24002}  RED  FLAGS: {PT Red Flags:29287}   WEIGHT BEARING RESTRICTIONS:  {Yes ***/No:24003}  FALLS:  Has patient fallen in last 6 months? {fallsyesno:27318}  LIVING ENVIRONMENT: Lives with: {OPRC lives with:25569::"lives with their family"} Lives in: {Lives in:25570} Stairs: {opstairs:27293} Has following equipment at home: {Assistive devices:23999}  OCCUPATION:  ***  PLOF:  {PLOF:24004}  PATIENT GOALS:  ***  NEXT MD VISIT: ***   OBJECTIVE:  Note: Objective measures were completed at Evaluation unless otherwise noted.  DIAGNOSTIC FINDINGS:  02/02/24 MRI R hip: IMPRESSION: 1. No hip fracture, dislocation or avascular necrosis. 2. Mild osteoarthritis of the right hip. 3. Right superior labral degeneration with a tear. 4. Severe disc height loss at L5-S1.  01/12/24 IMPRESSION bil knee: 1. Minimal medial and patellofemoral compartment osteoarthritis. 2. Mild patella alta.  02/13/23 R shoulder Xray: negative   PATIENT SURVEYS:  {rehab surveys:24030}  COGNITIVE STATUS: {cognition:24006}   SENSATION: {sensation:27233}  EDEMA:  {Yes/No:304960894}  POSTURE:  {posture:25561}  HAND DOMINANCE:  {MISC; OT HAND DOMINANCE:7278785140}  GAIT: Distance walked: *** Assistive device utilized: {Assistive devices:23999} Level of assistance: {Levels of assistance:24026} Comments: ***   {OP[facility] PT body part select:29305}                                                                                                                              TREATMENT DATE: ***     PATIENT EDUCATION:  Education details: *** Person educated: {Person educated:25204} Education method: {Education Method:25205} Education comprehension: {Education Comprehension:25206}  HOME EXERCISE PROGRAM: ***   ASSESSMENT:  CLINICAL IMPRESSION: Patient is a *** y.o. *** who was seen today for physical therapy evaluation and treatment for ***.    OBJECTIVE IMPAIRMENTS: {opptimpairments:25111}.    ACTIVITY LIMITATIONS: {activitylimitations:27494}  PARTICIPATION LIMITATIONS: {participationrestrictions:25113}  PERSONAL FACTORS: {Personal factors:25162} are also affecting patient's functional outcome.   REHAB POTENTIAL: {rehabpotential:25112}  CLINICAL DECISION MAKING: {clinical decision making:25114}  EVALUATION COMPLEXITY: {Evaluation complexity:25115}   GOALS: Goals reviewed with patient? {yes/no:20286}  SHORT TERM GOALS: Target date: ***  *** Baseline: Goal status: INITIAL  2.  *** Baseline:  Goal status: INITIAL  3.  *** Baseline:  Goal status: INITIAL  4.  *** Baseline:  Goal status: INITIAL  5.  *** Baseline:  Goal status: INITIAL  6.  *** Baseline:  Goal status: INITIAL  LONG TERM GOALS: Target date: ***  *** Baseline:  Goal status: INITIAL  2.  *** Baseline:  Goal status: INITIAL  3.  ***  Baseline:  Goal status: INITIAL  4.  *** Baseline:  Goal status: INITIAL  5.  *** Baseline:  Goal status: INITIAL  6.  *** Baseline:  Goal status: INITIAL   PLAN:  PT FREQUENCY: {rehab frequency:25116}  PT DURATION: {rehab duration:25117}  PLANNED INTERVENTIONS: {rehab planned interventions:25118::"97110-Therapeutic exercises","97530- Therapeutic [id number]- Neuromuscular re-education","97535- Self JXBJ","47829- Manual therapy"}.  PLAN FOR NEXT SESSION: ***   Jeannene Milling, PT 05/01/2024, 8:14 AM

## 2024-09-03 ENCOUNTER — Encounter: Payer: Self-pay | Admitting: Cardiology

## 2024-10-25 ENCOUNTER — Encounter: Payer: Self-pay | Admitting: Radiology

## 2024-10-29 ENCOUNTER — Other Ambulatory Visit: Payer: Self-pay | Admitting: Cardiology

## 2024-10-29 DIAGNOSIS — I10 Essential (primary) hypertension: Secondary | ICD-10-CM

## 2024-10-29 DIAGNOSIS — I255 Ischemic cardiomyopathy: Secondary | ICD-10-CM

## 2024-10-29 DIAGNOSIS — I2510 Atherosclerotic heart disease of native coronary artery without angina pectoris: Secondary | ICD-10-CM

## 2024-10-29 DIAGNOSIS — E785 Hyperlipidemia, unspecified: Secondary | ICD-10-CM

## 2024-12-27 NOTE — Progress Notes (Signed)
 "    HPI: FU CAD. Admitted 7/18 with NSTEMI (peak troponin 0.79). Cared for by Dr Levern. Cath revealed EF 35-45; 75 RCA, 75 distal LAD; pt had PCI of RCA and LAD with DES.   Echocardiogram December 2018 showed normal LV systolic function, grade 2 diastolic dysfunction.  Stress PET November 2024 showed no ischemia or infarction; study interpreted as intermediate risk due to TID (1.15 with normal less than 1.13), no augmentation of ejection fraction with stress and borderline reduced myocardial blood flow reserve.  We have treated medically.  Since last seen she does not have exertional chest pain.  Dyspnea with more vigorous activities but not routine activities.  No syncope.  Current Outpatient Medications  Medication Sig Dispense Refill   acetaminophen  (TYLENOL ) 500 MG tablet Take 1,000 mg by mouth every 6 (six) hours as needed for moderate pain or headache.     aspirin  EC 81 MG EC tablet Take 1 tablet (81 mg total) by mouth daily. 30 tablet 3   ezetimibe  (ZETIA ) 10 MG tablet Take 1 tablet (10 mg total) by mouth daily. 60 tablet 0   glucose blood test strip For blood sugar monitoring 3x daily with the One Touch Verio Flex glucometer     HUMALOG KWIKPEN 100 UNIT/ML KwikPen SMARTSIG:SUB-Q     insulin  lispro (HUMALOG) 100 UNIT/ML injection Inject 20 Units into the skin daily before supper.      LANTUS  SOLOSTAR 100 UNIT/ML Solostar Pen INJECT 50 UNITS DOSE INTO THE SKIN DAILY.     losartan  (COZAAR ) 50 MG tablet Take 1 tablet (50 mg total) by mouth daily. 60 tablet 0   metoprolol  succinate (TOPROL -XL) 50 MG 24 hr tablet Take 1 tablet (50 mg total) by mouth 2 (two) times daily. Take with or immediately following a meal. 120 tablet 0   nitroGLYCERIN  (NITROSTAT ) 0.4 MG SL tablet Place 1 tablet (0.4 mg total) under the tongue every 5 (five) minutes x 3 doses as needed for chest pain. 25 tablet 12   rosuvastatin  (CRESTOR ) 40 MG tablet Take 1 tablet (40 mg total) by mouth daily. 60 tablet 0   albuterol   (VENTOLIN  HFA) 108 (90 Base) MCG/ACT inhaler Inhale 1-2 puffs into the lungs every 6 (six) hours as needed for wheezing or shortness of breath. (Patient not taking: Reported on 01/06/2025) 1 each 0   benzonatate  (TESSALON ) 100 MG capsule Take 1 capsule (100 mg total) by mouth every 8 (eight) hours as needed for cough. (Patient not taking: Reported on 01/06/2025) 21 capsule 0   hydrOXYzine  (ATARAX ) 10 MG tablet Take 1 tablet (10 mg total) by mouth 2 (two) times daily as needed for anxiety. (Patient not taking: Reported on 01/06/2025) 30 tablet 0   ibuprofen  (ADVIL ) 800 MG tablet Take 1 tablet (800 mg total) by mouth 3 (three) times daily. (Patient not taking: Reported on 01/06/2025) 21 tablet 0   influenza vac split quadrivalent PF (FLUARIX) 0.5 ML injection Inject into the muscle. (Patient not taking: Reported on 01/06/2025) 0.5 mL 0   MOUNJARO 5 MG/0.5ML Pen Inject into the skin. (Patient not taking: Reported on 01/06/2025)     No current facility-administered medications for this visit.   Facility-Administered Medications Ordered in Other Visits  Medication Dose Route Frequency Provider Last Rate Last Admin   technetium tetrofosmin  (TC-MYOVIEW ) injection 30.8 millicurie  30.8 millicurie Intravenous Once PRN Shlomo Wilbert SAUNDERS, MD         Past Medical History:  Diagnosis Date   Abnormal Pap smear  Diabetes mellitus    insulin -dependent type 2   Family history of adverse reaction to anesthesia    sister has n/v   Fibroid    HNP (herniated nucleus pulposus)    Hypertension    chronic hypertension   Pregnancy induced hypertension     Past Surgical History:  Procedure Laterality Date   BACK SURGERY     CARDIAC CATHETERIZATION  02/29/2008   Normal coronary arteries -- Normal LV (left ventricular) systolic function -- Mild to moderate elevation in left ventricular end-diastolic  pressure secondary to hypertension, diabetes and obesity   CHOLECYSTECTOMY  1995   COLPOSCOPY     CORONARY STENT  INTERVENTION N/A 07/21/2017   Procedure: Coronary Stent Intervention;  Surgeon: Levern Hutching, MD;  Location: [facility];  Service: Cardiovascular;  Laterality: N/A;   DECOMPRESSIVE LUMBAR LAMINECTOMY LEVEL 1 Right 11/22/2015   Procedure: DECOMPRESSIVE LUMBAR LAMINECTOMY L5-S1 ON RIGHT,FORAMINOTOMIES L5,S1 DISCECTOMY L5,S1;  Surgeon: Reyes Billing, MD;  Location: [facility];  Service: Orthopedics;  Laterality: Right;   GYNECOLOGIC CRYOSURGERY  1998   Hysteroscopy, D&C, Novasure ablation, removal of Intrauterine device  01/08/2008   LEFT HEART CATH AND CORONARY ANGIOGRAPHY N/A 07/21/2017   Procedure: Left Heart Cath and Coronary Angiography;  Surgeon: Levern Hutching, MD;  Location: [facility];  Service: Cardiovascular;  Laterality: N/A;   LUMBAR LAMINECTOMY/DECOMPRESSION MICRODISCECTOMY Right 10/11/2016   Procedure: REVISION MICRO LUMBAR DECOMPRESSION L5-S1 RIGHT;  Surgeon: Reyes Billing, MD;  Location: [facility];  Service: Orthopedics;  Laterality: Right;   NOVASURE ABLATION  01/08/2008    Social History   Socioeconomic History   Marital status: Single    Spouse name: Not on file   Number of children: 1   Years of education: Not on file   Highest education level: Not on file  Occupational History   Occupation: [employer], consierge  Tobacco Use   Smoking status: Never   Smokeless tobacco: Never  Vaping Use   Vaping status: Never Used  Substance and Sexual Activity   Alcohol use: No   Drug use: No   Sexual activity: Not Currently    Birth control/protection: Surgical    Comment: ablation  Other Topics Concern   Not on file  Social History Narrative   Not on file   Social Drivers of Health   Tobacco Use: Low Risk (01/06/2025)   Patient History    Smoking Tobacco Use: Never    Smokeless Tobacco Use: Never    Passive Exposure: Not on file  Financial Resource Strain: Not on file  Food Insecurity: Medium Risk (10/27/2024)   Received from [facility]   Epic    Within the  past 12 months, you worried that your food would run out before you got money to buy more: Sometimes true    Within the past 12 months, the food you bought just didn't last and you didn't have money to get more. : Sometimes true  Transportation Needs: No Transportation Needs (10/27/2024)   Received from [facility]    In the past 12 months, has lack of reliable transportation kept you from medical appointments, meetings, work or from getting things needed for daily living? : No  Physical Activity: Not on file  Stress: Not on file  Social Connections: Not on file  Intimate Partner Violence: Not on file  Depression (EYV7-0): Not on file  Alcohol Screen: Not on file  Housing: Low Risk (10/27/2024)   Received from [facility]  What is your living situation today?: I have a steady place to live    Think about the place you live. Do you have problems with any of the following? Choose all that apply:: None/None on this list  Utilities: Medium Risk (10/27/2024)   Received from [facility]   Utilities    In the past 12 months has the electric, gas, oil, or water company threatened to shut off services in your home? : Yes  Health Literacy: Not on file    Family History  Problem Relation Age of Onset   Heart disease Mother    Diabetes Mother    Hypertension Father    Diabetes Father    Kidney failure Father    Stroke Father    Hypertension Paternal Grandmother    Diabetes Paternal Grandmother    Stroke Paternal Grandmother    Diabetes Maternal Grandmother    Ovarian cancer Maternal Grandmother    Diabetes Paternal Grandfather     ROS: no fevers or chills, productive cough, hemoptysis, dysphasia, odynophagia, melena, hematochezia, dysuria, hematuria, rash, seizure activity, orthopnea, PND, pedal edema, claudication. Remaining systems are negative.  Physical Exam: Well-developed obese in no acute distress.  Skin is warm and dry.  HEENT is normal.  Neck is  supple.  Chest is clear to auscultation with normal expansion.  Cardiovascular exam is regular rate and rhythm.  Abdominal exam nontender or distended. No masses palpated. Extremities show no edema. neuro grossly intact  EKG Interpretation Date/Time:  Thursday January 06 2025 08:31:49 EST Ventricular Rate:  82 PR Interval:  150 QRS Duration:  74 QT Interval:  350 QTC Calculation: 408 R Axis:   24  Text Interpretation: Normal sinus rhythm Normal ECG Confirmed by Pietro Rogue ([id number]) on 01/06/2025 8:37:50 AM    A/P  1 coronary artery disease-continue aspirin  and statin.  2 history of ischemic cardiomyopathy-LV function improved on most recent echocardiogram.  Continue beta-blocker and ARB.  3 hyperlipidemia-continue statin.  4 hypertension-patient's blood pressure mildly elevated; however she has not taken her medications yet this morning.  She will follow her blood pressure and we will advance medications if needed.  5 history of obesity-she is planning on discussing a GLP agonist with her endocrinologist.  Rogue Pietro, MD    "

## 2025-01-06 ENCOUNTER — Encounter: Payer: Self-pay | Admitting: Cardiology

## 2025-01-06 ENCOUNTER — Ambulatory Visit: Payer: PRIVATE HEALTH INSURANCE | Admitting: Cardiology

## 2025-01-06 VITALS — BP 144/83 | HR 82 | Ht 66.0 in | Wt 278.3 lb

## 2025-01-06 DIAGNOSIS — I25118 Atherosclerotic heart disease of native coronary artery with other forms of angina pectoris: Secondary | ICD-10-CM | POA: Diagnosis not present

## 2025-01-06 DIAGNOSIS — I255 Ischemic cardiomyopathy: Secondary | ICD-10-CM | POA: Diagnosis not present

## 2025-01-06 DIAGNOSIS — E785 Hyperlipidemia, unspecified: Secondary | ICD-10-CM | POA: Diagnosis not present

## 2025-01-06 DIAGNOSIS — I10 Essential (primary) hypertension: Secondary | ICD-10-CM

## 2025-01-06 NOTE — Patient Instructions (Signed)
 Medication Instructions:  No Changes *If you need a refill on your cardiac medications before your next appointment, please call your pharmacy*  Lab Work: None  Follow-Up: At [facility], you and your health needs are our priority.  As part of our continuing mission to provide you with exceptional heart care, our providers are all part of one team.  This team includes your primary Cardiologist (physician) and Advanced Practice Providers or APPs (Physician Assistants and Nurse Practitioners) who all work together to provide you with the care you need, when you need it.  Your next appointment:   1 year(s) (Due January 2027)  Provider:   Redell Shallow, MD

## 2025-01-19 ENCOUNTER — Other Ambulatory Visit: Payer: Self-pay | Admitting: Cardiology

## 2025-01-19 DIAGNOSIS — I255 Ischemic cardiomyopathy: Secondary | ICD-10-CM

## 2025-01-19 DIAGNOSIS — E785 Hyperlipidemia, unspecified: Secondary | ICD-10-CM

## 2025-01-19 DIAGNOSIS — I2510 Atherosclerotic heart disease of native coronary artery without angina pectoris: Secondary | ICD-10-CM

## 2025-01-19 DIAGNOSIS — I10 Essential (primary) hypertension: Secondary | ICD-10-CM

## 2025-01-21 ENCOUNTER — Telehealth: Payer: Self-pay | Admitting: Cardiology

## 2025-01-21 NOTE — Telephone Encounter (Signed)
" °*  STAT* If patient is at the pharmacy, call can be transferred to refill team.   1. Which medications need to be refilled? (please list name of each medication and dose if known)  rosuvastatin  (CRESTOR ) 40 MG tablet   ezetimibe  (ZETIA ) 10 MG tablet  losartan  (COZAAR ) 50 MG tablet  2. Which pharmacy/location (including street and city if local pharmacy) is medication to be sent to? [facility] - [place], [place] - [address]   3. Do they need a 30 day or 90 day supply? 90 day   "

## 2025-01-24 NOTE — Telephone Encounter (Signed)
 Refills were sent 01/21/25.

## 2025-01-27 ENCOUNTER — Ambulatory Visit (HOSPITAL_BASED_OUTPATIENT_CLINIC_OR_DEPARTMENT_OTHER)

## 2025-01-27 ENCOUNTER — Ambulatory Visit (INDEPENDENT_AMBULATORY_CARE_PROVIDER_SITE_OTHER): Admitting: Student

## 2025-01-27 DIAGNOSIS — G8929 Other chronic pain: Secondary | ICD-10-CM

## 2025-01-27 DIAGNOSIS — M25511 Pain in right shoulder: Secondary | ICD-10-CM | POA: Diagnosis not present

## 2025-01-27 MED ORDER — BETAMETHASONE SOD PHOS & ACET 6 (3-3) MG/ML IJ SUSP
2.0000 mL | INTRAMUSCULAR | Status: AC | PRN
  Administered 2025-01-27: 2 mL via INTRA_ARTICULAR

## 2025-01-27 MED ORDER — LIDOCAINE HCL 1 % IJ SOLN
4.0000 mL | INTRAMUSCULAR | Status: AC | PRN
  Administered 2025-01-27: 4 mL

## 2025-01-27 NOTE — Progress Notes (Signed)
 "                                Chief Complaint: Right shoulder pain     History of Present Illness:    Erica Nguyen is a 54 y.o. female who presents today for evaluation of worsening right shoulder pain.  She does have history of shoulder pain consistent with adhesive capsulitis in 2024 which resolved after 2 cortisone injections.  Today she reports that pain has been worsening over the last 2 months, and has become severe enough to make sleep difficult at night.  Pain is mainly over the lateral shoulder but does also hurt anteriorly.  She has been taking Tylenol  arthritis without long-lasting relief.  She does have history of type 2 diabetes and is currently on Mounjaro, Lantus , and Humalog.  Her A1c has significantly improved from 12.4 to now 8.5.   Surgical History:   None  PMH/PSH/Family History/Social History/Meds/Allergies:    Past Medical History:  Diagnosis Date   Abnormal Pap smear    Diabetes mellitus    insulin -dependent type 2   Family history of adverse reaction to anesthesia    sister has n/v   Fibroid    HNP (herniated nucleus pulposus)    Hypertension    chronic hypertension   Pregnancy induced hypertension    Past Surgical History:  Procedure Laterality Date   BACK SURGERY     CARDIAC CATHETERIZATION  02/29/2008   Normal coronary arteries -- Normal LV (left ventricular) systolic function -- Mild to moderate elevation in left ventricular end-diastolic  pressure secondary to hypertension, diabetes and obesity   CHOLECYSTECTOMY  1995   COLPOSCOPY     CORONARY STENT INTERVENTION N/A 07/21/2017   Procedure: Coronary Stent Intervention;  Surgeon: Levern Hutching, MD;  Location: [facility];  Service: Cardiovascular;  Laterality: N/A;   DECOMPRESSIVE LUMBAR LAMINECTOMY LEVEL 1 Right 11/22/2015   Procedure: DECOMPRESSIVE LUMBAR LAMINECTOMY L5-S1 ON RIGHT,FORAMINOTOMIES L5,S1 DISCECTOMY L5,S1;  Surgeon: Reyes Billing, MD;  Location: [facility];  Service:  Orthopedics;  Laterality: Right;   GYNECOLOGIC CRYOSURGERY  1998   Hysteroscopy, D&C, Novasure ablation, removal of Intrauterine device  01/08/2008   LEFT HEART CATH AND CORONARY ANGIOGRAPHY N/A 07/21/2017   Procedure: Left Heart Cath and Coronary Angiography;  Surgeon: Levern Hutching, MD;  Location: [facility];  Service: Cardiovascular;  Laterality: N/A;   LUMBAR LAMINECTOMY/DECOMPRESSION MICRODISCECTOMY Right 10/11/2016   Procedure: REVISION MICRO LUMBAR DECOMPRESSION L5-S1 RIGHT;  Surgeon: Reyes Billing, MD;  Location: [facility];  Service: Orthopedics;  Laterality: Right;   NOVASURE ABLATION  01/08/2008   Social History   Socioeconomic History   Marital status: Single    Spouse name: Not on file   Number of children: 1   Years of education: Not on file   Highest education level: Not on file  Occupational History   Occupation: [employer], consierge  Tobacco Use   Smoking status: Never   Smokeless tobacco: Never  Vaping Use   Vaping status: Never Used  Substance and Sexual Activity   Alcohol use: No   Drug use: No   Sexual activity: Not Currently    Birth control/protection: Surgical    Comment: ablation  Other Topics Concern   Not on file  Social History Narrative   Not on file   Social Drivers of Health   Tobacco Use: Low Risk (01/14/2025)   Received from [facility]  Patient History    Smoking Tobacco Use: Never    Smokeless Tobacco Use: Never    Passive Exposure: Not on file  Financial Resource Strain: Not on file  Food Insecurity: Medium Risk (10/27/2024)   Received from [facility]   Epic    Within the past 12 months, you worried that your food would run out before you got money to buy more: Sometimes true    Within the past 12 months, the food you bought just didn't last and you didn't have money to get more. : Sometimes true  Transportation Needs: No Transportation Needs (10/27/2024)   Received from [facility]    In the past 12 months,  has lack of reliable transportation kept you from medical appointments, meetings, work or from getting things needed for daily living? : No  Physical Activity: Not on file  Stress: Not on file  Social Connections: Not on file  Depression (EYV7-0): Not on file  Alcohol Screen: Not on file  Housing: Low Risk (10/27/2024)   Received from [facility]   Epic    What is your living situation today?: I have a steady place to live    Think about the place you live. Do you have problems with any of the following? Choose all that apply:: None/None on this list  Utilities: Medium Risk (10/27/2024)   Received from [facility]   Utilities    In the past 12 months has the electric, gas, oil, or water company threatened to shut off services in your home? : Yes  Health Literacy: Not on file   Family History  Problem Relation Age of Onset   Heart disease Mother    Diabetes Mother    Hypertension Father    Diabetes Father    Kidney failure Father    Stroke Father    Hypertension Paternal Grandmother    Diabetes Paternal Grandmother    Stroke Paternal Grandmother    Diabetes Maternal Grandmother    Ovarian cancer Maternal Grandmother    Diabetes Paternal Grandfather    Allergies[1] Current Outpatient Medications  Medication Sig Dispense Refill   acetaminophen  (TYLENOL ) 500 MG tablet Take 1,000 mg by mouth every 6 (six) hours as needed for moderate pain or headache.     albuterol  (VENTOLIN  HFA) 108 (90 Base) MCG/ACT inhaler Inhale 1-2 puffs into the lungs every 6 (six) hours as needed for wheezing or shortness of breath. (Patient not taking: Reported on 01/06/2025) 1 each 0   aspirin  EC 81 MG EC tablet Take 1 tablet (81 mg total) by mouth daily. 30 tablet 3   benzonatate  (TESSALON ) 100 MG capsule Take 1 capsule (100 mg total) by mouth every 8 (eight) hours as needed for cough. (Patient not taking: Reported on 01/06/2025) 21 capsule 0   ezetimibe  (ZETIA ) 10 MG tablet Take 1 tablet (10 mg total) by  mouth daily. 180 tablet 1   glucose blood test strip For blood sugar monitoring 3x daily with the One Touch Verio Flex glucometer     HUMALOG KWIKPEN 100 UNIT/ML KwikPen SMARTSIG:SUB-Q     hydrOXYzine  (ATARAX ) 10 MG tablet Take 1 tablet (10 mg total) by mouth 2 (two) times daily as needed for anxiety. (Patient not taking: Reported on 01/06/2025) 30 tablet 0   ibuprofen  (ADVIL ) 800 MG tablet Take 1 tablet (800 mg total) by mouth 3 (three) times daily. (Patient not taking: Reported on 01/06/2025) 21 tablet 0   influenza vac split quadrivalent PF (FLUARIX) 0.5 ML  injection Inject into the muscle. (Patient not taking: Reported on 01/06/2025) 0.5 mL 0   insulin  lispro (HUMALOG) 100 UNIT/ML injection Inject 20 Units into the skin daily before supper.      LANTUS  SOLOSTAR 100 UNIT/ML Solostar Pen INJECT 50 UNITS DOSE INTO THE SKIN DAILY.     losartan  (COZAAR ) 50 MG tablet Take 1 tablet (50 mg total) by mouth daily. 90 tablet 1   metoprolol  succinate (TOPROL -XL) 50 MG 24 hr tablet Take 1 tablet (50 mg total) by mouth 2 (two) times daily. Take with or immediately following a meal. 180 tablet 1   MOUNJARO 5 MG/0.5ML Pen Inject into the skin. (Patient not taking: Reported on 01/06/2025)     nitroGLYCERIN  (NITROSTAT ) 0.4 MG SL tablet Place 1 tablet (0.4 mg total) under the tongue every 5 (five) minutes x 3 doses as needed for chest pain. 25 tablet 12   rosuvastatin  (CRESTOR ) 40 MG tablet Take 1 tablet (40 mg total) by mouth daily. 90 tablet 1   No current facility-administered medications for this visit.   Facility-Administered Medications Ordered in Other Visits  Medication Dose Route Frequency Provider Last Rate Last Admin   technetium tetrofosmin  (TC-MYOVIEW ) injection 30.8 millicurie  30.8 millicurie Intravenous Once PRN Turner, Wilbert SAUNDERS, MD       No results found.  Review of Systems:   A ROS was performed including pertinent positives and negatives as documented in the HPI.  Physical Exam :    Constitutional: NAD and appears stated age Neurological: Alert and oriented Psych: Appropriate affect and cooperative There were no vitals taken for this visit.   Comprehensive Musculoskeletal Exam:    Active range of motion of the right shoulder is to 90 degrees flexion, 20 degrees external rotation, and internal rotation to back pocket.  130 degrees of passive flexion.  Tenderness over the lateral deltoid.  Negative Hawkins and empty can.  Imaging:   Xray (right shoulder 3 views): Negative for bony abnormality   I personally reviewed and interpreted the radiographs.   Assessment:   54 y.o. female with acute on chronic pain of her right shoulder.  She does have a history of adhesive capsulitis in the shoulder 2 years ago which resolved after injections.  Differential diagnoses today include adhesive capsulitis versus a rotator cuff tear.  She does have decreased range of motion due to pain with distribution including the lateral shoulder that does worsen at night, therefore discussed the rotator cuff pathology could also be responsible for this.  Her diabetic control has been significantly improving, although does still remain somewhat elevated at 8.5.  She is on a good medication regimen and ultimately we decided to proceed with a glenohumeral cortisone injection, with significant caution to elevations of blood glucose, therefore patient will continue to monitor this very closely.  I would like to have her come back in 2 weeks for reassessment and if patient still presents with pain and deficits in range of motion I would likely proceed with an MRI for further rotator cuff assessment.  Patient is agreeable to this plan.  Plan :    - Right shoulder glenohumeral injection performed today - Return to clinic in 2 weeks    Procedure Note  Patient: RICKY DOAN             Date of Birth: [birth year]           MRN: [id number]             Visit Date: 01/27/2025  Procedures: Visit  Diagnoses:  1. Chronic right shoulder pain     Large Joint Inj: R glenohumeral on 01/27/2025 6:26 PM Indications: pain Details: 22 G 1.5 in needle, anterior approach Medications: 4 mL lidocaine  1 %; 2 mL betamethasone  acetate-betamethasone  sodium phosphate  6 (3-3) MG/ML Outcome: tolerated well, no immediate complications Procedure, treatment alternatives, risks and benefits explained, specific risks discussed. Consent was given by the patient. Immediately prior to procedure a time out was called to verify the correct patient, procedure, equipment, support staff and site/side marked as required. Patient was prepped and draped in the usual sterile fashion.       I personally saw and evaluated the patient, and participated in the management and treatment plan.  Leonce Reveal, PA-C Orthopedics    [1]  Allergies Allergen Reactions   Amoxicillin Other (See Comments)    Yeast Infection with AMOXIL Use Give pt yeast infection Has patient had a PCN reaction causing immediate rash, facial/tongue/throat swelling, SOB or lightheadedness with hypotension: No Has patient had a PCN reaction causing severe rash involving mucus membranes or skin necrosis: No Has patient had a PCN reaction that required hospitalization No Has patient had a PCN reaction occurring within the last 10 years: No If all of the above answers are NO, then may proceed with Cephalosporin use.  Yeast Infection with AMOXIL Use   "

## 2025-02-23 ENCOUNTER — Ambulatory Visit (HOSPITAL_BASED_OUTPATIENT_CLINIC_OR_DEPARTMENT_OTHER): Admitting: Orthopaedic Surgery
